# Patient Record
Sex: Male | Born: 1950 | Race: Black or African American | Hispanic: No | Marital: Married | Smoking: Former smoker
Health system: Southern US, Community
[De-identification: ages and names within clinical notes are randomized; demographics above are authoritative.]

## PROBLEM LIST (undated history)

## (undated) DIAGNOSIS — J439 Emphysema, unspecified: Secondary | ICD-10-CM

## (undated) DIAGNOSIS — I252 Old myocardial infarction: Secondary | ICD-10-CM

## (undated) DIAGNOSIS — K08109 Complete loss of teeth, unspecified cause, unspecified class: Secondary | ICD-10-CM

## (undated) DIAGNOSIS — D649 Anemia, unspecified: Secondary | ICD-10-CM

## (undated) DIAGNOSIS — E7800 Pure hypercholesterolemia, unspecified: Secondary | ICD-10-CM

## (undated) DIAGNOSIS — K219 Gastro-esophageal reflux disease without esophagitis: Secondary | ICD-10-CM

## (undated) DIAGNOSIS — I428 Other cardiomyopathies: Secondary | ICD-10-CM

## (undated) DIAGNOSIS — Z7901 Long term (current) use of anticoagulants: Secondary | ICD-10-CM

## (undated) DIAGNOSIS — F329 Major depressive disorder, single episode, unspecified: Secondary | ICD-10-CM

## (undated) DIAGNOSIS — F32A Depression, unspecified: Secondary | ICD-10-CM

## (undated) DIAGNOSIS — I10 Essential (primary) hypertension: Secondary | ICD-10-CM

## (undated) DIAGNOSIS — I2510 Atherosclerotic heart disease of native coronary artery without angina pectoris: Secondary | ICD-10-CM

## (undated) DIAGNOSIS — R600 Localized edema: Secondary | ICD-10-CM

## (undated) DIAGNOSIS — G8929 Other chronic pain: Secondary | ICD-10-CM

## (undated) DIAGNOSIS — F419 Anxiety disorder, unspecified: Secondary | ICD-10-CM

## (undated) DIAGNOSIS — I480 Paroxysmal atrial fibrillation: Secondary | ICD-10-CM

## (undated) DIAGNOSIS — N210 Calculus in bladder: Secondary | ICD-10-CM

## (undated) DIAGNOSIS — K5909 Other constipation: Secondary | ICD-10-CM

## (undated) DIAGNOSIS — Q8501 Neurofibromatosis, type 1: Secondary | ICD-10-CM

## (undated) DIAGNOSIS — H409 Unspecified glaucoma: Secondary | ICD-10-CM

## (undated) DIAGNOSIS — I5022 Chronic systolic (congestive) heart failure: Secondary | ICD-10-CM

## (undated) DIAGNOSIS — Z972 Presence of dental prosthetic device (complete) (partial): Secondary | ICD-10-CM

## (undated) DIAGNOSIS — Z8679 Personal history of other diseases of the circulatory system: Secondary | ICD-10-CM

## (undated) DIAGNOSIS — M549 Dorsalgia, unspecified: Secondary | ICD-10-CM

## (undated) DIAGNOSIS — N401 Enlarged prostate with lower urinary tract symptoms: Secondary | ICD-10-CM

## (undated) HISTORY — PX: CARDIAC CATHETERIZATION: SHX172

## (undated) HISTORY — PX: TRANSTHORACIC ECHOCARDIOGRAM: SHX275

## (undated) HISTORY — PX: EXCISIONAL HEMORRHOIDECTOMY: SHX1541

---

## 1999-05-08 ENCOUNTER — Encounter (INDEPENDENT_AMBULATORY_CARE_PROVIDER_SITE_OTHER): Payer: Self-pay | Admitting: *Deleted

## 1999-05-08 ENCOUNTER — Ambulatory Visit (HOSPITAL_COMMUNITY): Admission: RE | Admit: 1999-05-08 | Discharge: 1999-05-08 | Payer: Self-pay | Admitting: Gastroenterology

## 1999-05-21 ENCOUNTER — Encounter: Payer: Self-pay | Admitting: Gastroenterology

## 1999-05-21 ENCOUNTER — Encounter: Admission: RE | Admit: 1999-05-21 | Discharge: 1999-05-21 | Payer: Self-pay | Admitting: Gastroenterology

## 2000-09-22 ENCOUNTER — Encounter (HOSPITAL_COMMUNITY): Admission: RE | Admit: 2000-09-22 | Discharge: 2000-09-25 | Payer: Self-pay

## 2000-09-22 ENCOUNTER — Ambulatory Visit (HOSPITAL_COMMUNITY): Admission: RE | Admit: 2000-09-22 | Discharge: 2000-09-22 | Payer: Self-pay

## 2000-10-06 ENCOUNTER — Ambulatory Visit (HOSPITAL_COMMUNITY): Admission: RE | Admit: 2000-10-06 | Discharge: 2000-10-06 | Payer: Self-pay

## 2000-10-06 ENCOUNTER — Encounter (INDEPENDENT_AMBULATORY_CARE_PROVIDER_SITE_OTHER): Payer: Self-pay

## 2002-12-23 ENCOUNTER — Ambulatory Visit (HOSPITAL_COMMUNITY): Admission: RE | Admit: 2002-12-23 | Discharge: 2002-12-23 | Payer: Self-pay | Admitting: *Deleted

## 2003-06-15 ENCOUNTER — Emergency Department (HOSPITAL_COMMUNITY): Admission: EM | Admit: 2003-06-15 | Discharge: 2003-06-15 | Payer: Self-pay | Admitting: Emergency Medicine

## 2006-01-05 ENCOUNTER — Inpatient Hospital Stay (HOSPITAL_COMMUNITY): Admission: EM | Admit: 2006-01-05 | Discharge: 2006-01-07 | Payer: Self-pay | Admitting: Emergency Medicine

## 2006-01-05 ENCOUNTER — Ambulatory Visit: Payer: Self-pay | Admitting: Cardiology

## 2006-01-23 ENCOUNTER — Ambulatory Visit: Payer: Self-pay | Admitting: Cardiology

## 2008-05-25 ENCOUNTER — Emergency Department (HOSPITAL_COMMUNITY): Admission: EM | Admit: 2008-05-25 | Discharge: 2008-05-25 | Payer: Self-pay | Admitting: Emergency Medicine

## 2008-10-31 ENCOUNTER — Encounter: Payer: Self-pay | Admitting: Cardiology

## 2008-11-08 ENCOUNTER — Emergency Department (HOSPITAL_COMMUNITY): Admission: EM | Admit: 2008-11-08 | Discharge: 2008-11-08 | Payer: Self-pay | Admitting: Family Medicine

## 2008-11-11 DIAGNOSIS — E785 Hyperlipidemia, unspecified: Secondary | ICD-10-CM

## 2008-11-11 DIAGNOSIS — R718 Other abnormality of red blood cells: Secondary | ICD-10-CM

## 2008-11-11 DIAGNOSIS — I10 Essential (primary) hypertension: Secondary | ICD-10-CM

## 2008-11-11 DIAGNOSIS — D509 Iron deficiency anemia, unspecified: Secondary | ICD-10-CM

## 2008-11-11 DIAGNOSIS — R079 Chest pain, unspecified: Secondary | ICD-10-CM | POA: Insufficient documentation

## 2008-11-11 DIAGNOSIS — K219 Gastro-esophageal reflux disease without esophagitis: Secondary | ICD-10-CM | POA: Insufficient documentation

## 2008-11-16 ENCOUNTER — Encounter (INDEPENDENT_AMBULATORY_CARE_PROVIDER_SITE_OTHER): Payer: Self-pay | Admitting: *Deleted

## 2008-12-23 ENCOUNTER — Ambulatory Visit: Payer: Self-pay | Admitting: Internal Medicine

## 2008-12-23 ENCOUNTER — Observation Stay (HOSPITAL_COMMUNITY): Admission: EM | Admit: 2008-12-23 | Discharge: 2008-12-24 | Payer: Self-pay | Admitting: Emergency Medicine

## 2008-12-25 ENCOUNTER — Telehealth: Payer: Self-pay | Admitting: Cardiology

## 2009-01-01 ENCOUNTER — Telehealth (INDEPENDENT_AMBULATORY_CARE_PROVIDER_SITE_OTHER): Payer: Self-pay | Admitting: *Deleted

## 2009-01-02 ENCOUNTER — Ambulatory Visit: Payer: Self-pay | Admitting: Internal Medicine

## 2009-01-02 ENCOUNTER — Ambulatory Visit: Payer: Self-pay

## 2009-01-02 ENCOUNTER — Encounter: Payer: Self-pay | Admitting: Cardiology

## 2009-01-02 ENCOUNTER — Encounter: Payer: Self-pay | Admitting: Cardiovascular Disease

## 2009-01-02 ENCOUNTER — Observation Stay (HOSPITAL_COMMUNITY): Admission: EM | Admit: 2009-01-02 | Discharge: 2009-01-04 | Payer: Self-pay | Admitting: Emergency Medicine

## 2009-02-12 ENCOUNTER — Ambulatory Visit: Payer: Self-pay | Admitting: Cardiology

## 2009-02-12 DIAGNOSIS — I2510 Atherosclerotic heart disease of native coronary artery without angina pectoris: Secondary | ICD-10-CM | POA: Insufficient documentation

## 2009-02-13 LAB — CONVERTED CEMR LAB
BUN: 19 mg/dL (ref 6–23)
Chloride: 106 meq/L (ref 96–112)
Glucose, Bld: 91 mg/dL (ref 70–99)
Potassium: 4 meq/L (ref 3.5–5.1)

## 2010-07-03 ENCOUNTER — Other Ambulatory Visit: Payer: Self-pay | Admitting: Family Medicine

## 2010-07-15 ENCOUNTER — Telehealth (INDEPENDENT_AMBULATORY_CARE_PROVIDER_SITE_OTHER): Payer: Self-pay | Admitting: *Deleted

## 2010-07-25 NOTE — Progress Notes (Signed)
  Phone Note Other Incoming   Request: Send information Summary of Call: Request for records received from [facility]. Request forwarded to [facility].     

## 2010-08-24 LAB — CBC
HCT: 39.5 % (ref 39.0–52.0)
HCT: 42.3 % (ref 39.0–52.0)
HCT: 44.6 % (ref 39.0–52.0)
Hemoglobin: 13.1 g/dL (ref 13.0–17.0)
MCHC: 32.7 g/dL (ref 30.0–36.0)
MCHC: 33.2 g/dL (ref 30.0–36.0)
MCV: 77.7 fL — ABNORMAL LOW (ref 78.0–100.0)
MCV: 77.7 fL — ABNORMAL LOW (ref 78.0–100.0)
MCV: 78.6 fL (ref 78.0–100.0)
Platelets: 274 10*3/uL (ref 150–400)
Platelets: 275 10*3/uL (ref 150–400)
RBC: 5.03 MIL/uL (ref 4.22–5.81)
RDW: 14.9 % (ref 11.5–15.5)
WBC: 7.9 10*3/uL (ref 4.0–10.5)

## 2010-08-24 LAB — BASIC METABOLIC PANEL
BUN: 22 mg/dL (ref 6–23)
CO2: 26 mEq/L (ref 19–32)
CO2: 27 mEq/L (ref 19–32)
Calcium: 8.9 mg/dL (ref 8.4–10.5)
Chloride: 104 mEq/L (ref 96–112)
Chloride: 106 mEq/L (ref 96–112)
Glucose, Bld: 97 mg/dL (ref 70–99)
Glucose, Bld: 98 mg/dL (ref 70–99)
Potassium: 3.6 mEq/L (ref 3.5–5.1)
Sodium: 138 mEq/L (ref 135–145)

## 2010-08-24 LAB — COMPREHENSIVE METABOLIC PANEL
ALT: 11 U/L (ref 0–53)
AST: 17 U/L (ref 0–37)
Alkaline Phosphatase: 61 U/L (ref 39–117)
CO2: 28 mEq/L (ref 19–32)
Chloride: 103 mEq/L (ref 96–112)
Creatinine, Ser: 1.05 mg/dL (ref 0.4–1.5)
GFR calc Af Amer: >60 mL/min (ref >60)
GFR calc non Af Amer: >60 mL/min (ref >60)
Total Bilirubin: 1.2 mg/dL (ref 0.3–1.2)

## 2010-08-24 LAB — DIFFERENTIAL
Basophils Absolute: 0 10*3/uL (ref 0.0–0.1)
Basophils Relative: 0 % (ref 0–1)
Eosinophils Absolute: 0.1 10*3/uL (ref 0.0–0.7)
Eosinophils Relative: 1 % (ref 0–5)
Lymphocytes Relative: 23 % (ref 12–46)

## 2010-08-24 LAB — CARDIAC PANEL(CRET KIN+CKTOT+MB+TROPI)
CK, MB: 1.4 ng/mL (ref 0.3–4.0)
Relative Index: 1 (ref 0.0–2.5)
Relative Index: 1.2 (ref 0.0–2.5)
Total CK: 105 U/L (ref 7–232)

## 2010-08-24 LAB — LIPID PANEL
Cholesterol: 187 mg/dL (ref 0–200)
HDL: 69 mg/dL (ref >39)
Triglycerides: 46 mg/dL (ref <150)

## 2010-08-24 LAB — HEPARIN LEVEL (UNFRACTIONATED): Heparin Unfractionated: 0.45 IU/mL (ref 0.30–0.70)

## 2010-08-24 LAB — PROTIME-INR: Prothrombin Time: 13.4 seconds (ref 11.6–15.2)

## 2010-08-25 LAB — RAPID URINE DRUG SCREEN, HOSP PERFORMED
Barbiturates: NOT DETECTED
Cocaine: NOT DETECTED
Opiates: NOT DETECTED

## 2010-08-25 LAB — DIFFERENTIAL
Basophils Absolute: 0 10*3/uL (ref 0.0–0.1)
Basophils Relative: 0 % (ref 0–1)
Eosinophils Absolute: 0.1 10*3/uL (ref 0.0–0.7)
Neutro Abs: 5.4 10*3/uL (ref 1.7–7.7)
Neutrophils Relative %: 77 % (ref 43–77)

## 2010-08-25 LAB — CBC
HCT: 40.4 % (ref 39.0–52.0)
Hemoglobin: 13.2 g/dL (ref 13.0–17.0)
MCHC: 32.7 g/dL (ref 30.0–36.0)
MCV: 77.6 fL — ABNORMAL LOW (ref 78.0–100.0)
MCV: 78.3 fL (ref 78.0–100.0)
Platelets: 272 10*3/uL (ref 150–400)
RBC: 5.16 MIL/uL (ref 4.22–5.81)
RBC: 5.66 MIL/uL (ref 4.22–5.81)
RDW: 15.2 % (ref 11.5–15.5)
WBC: 7 10*3/uL (ref 4.0–10.5)

## 2010-08-25 LAB — POCT CARDIAC MARKERS
CKMB, poc: 1.1 ng/mL (ref 1.0–8.0)
CKMB, poc: <1 ng/mL — ABNORMAL LOW (ref 1.0–8.0)
Troponin i, poc: <0.05 ng/mL (ref 0.00–0.09)
Troponin i, poc: <0.05 ng/mL (ref 0.00–0.09)

## 2010-08-25 LAB — POCT I-STAT, CHEM 8
Calcium, Ion: 1.24 mmol/L (ref 1.12–1.32)
Creatinine, Ser: 1 mg/dL (ref 0.4–1.5)
Glucose, Bld: 99 mg/dL (ref 70–99)
HCT: 47 % (ref 39.0–52.0)
Hemoglobin: 16 g/dL (ref 13.0–17.0)
Potassium: 4.9 mEq/L (ref 3.5–5.1)

## 2010-08-25 LAB — PROTIME-INR
INR: 1 (ref 0.00–1.49)
Prothrombin Time: 13.1 seconds (ref 11.6–15.2)

## 2010-08-25 LAB — CARDIAC PANEL(CRET KIN+CKTOT+MB+TROPI)
CK, MB: 1.3 ng/mL (ref 0.3–4.0)
Relative Index: 1.2 (ref 0.0–2.5)
Relative Index: 1.5 (ref 0.0–2.5)
Total CK: 109 U/L (ref 7–232)

## 2010-08-25 LAB — URINALYSIS, ROUTINE W REFLEX MICROSCOPIC
Bilirubin Urine: NEGATIVE
Hgb urine dipstick: NEGATIVE
Nitrite: NEGATIVE
Specific Gravity, Urine: 1.023 (ref 1.005–1.030)
Urobilinogen, UA: 1 mg/dL (ref 0.0–1.0)
pH: 5.5 (ref 5.0–8.0)

## 2010-08-25 LAB — CK TOTAL AND CKMB (NOT AT ARMC)
CK, MB: 2.3 ng/mL (ref 0.3–4.0)
Relative Index: 1.3 (ref 0.0–2.5)
Total CK: 176 U/L (ref 7–232)

## 2010-09-02 LAB — POCT I-STAT, CHEM 8
Chloride: 104 mEq/L (ref 96–112)
Glucose, Bld: 99 mg/dL (ref 70–99)
HCT: 50 % (ref 39.0–52.0)
Hemoglobin: 17 g/dL (ref 13.0–17.0)
Potassium: 4.7 mEq/L (ref 3.5–5.1)
Sodium: 141 mEq/L (ref 135–145)

## 2010-09-07 IMAGING — CR DG CHEST 2V
2 series · 2 of 2 positions shown · non-contrast
Comparison: CT scan from 01/07/2006.  Chest x-ray from 01/06/2006.

CLINICAL DATA: Chest pain.  Weakness.  Shortness of breath.

CHEST - 2 VIEW

[w chest pa]
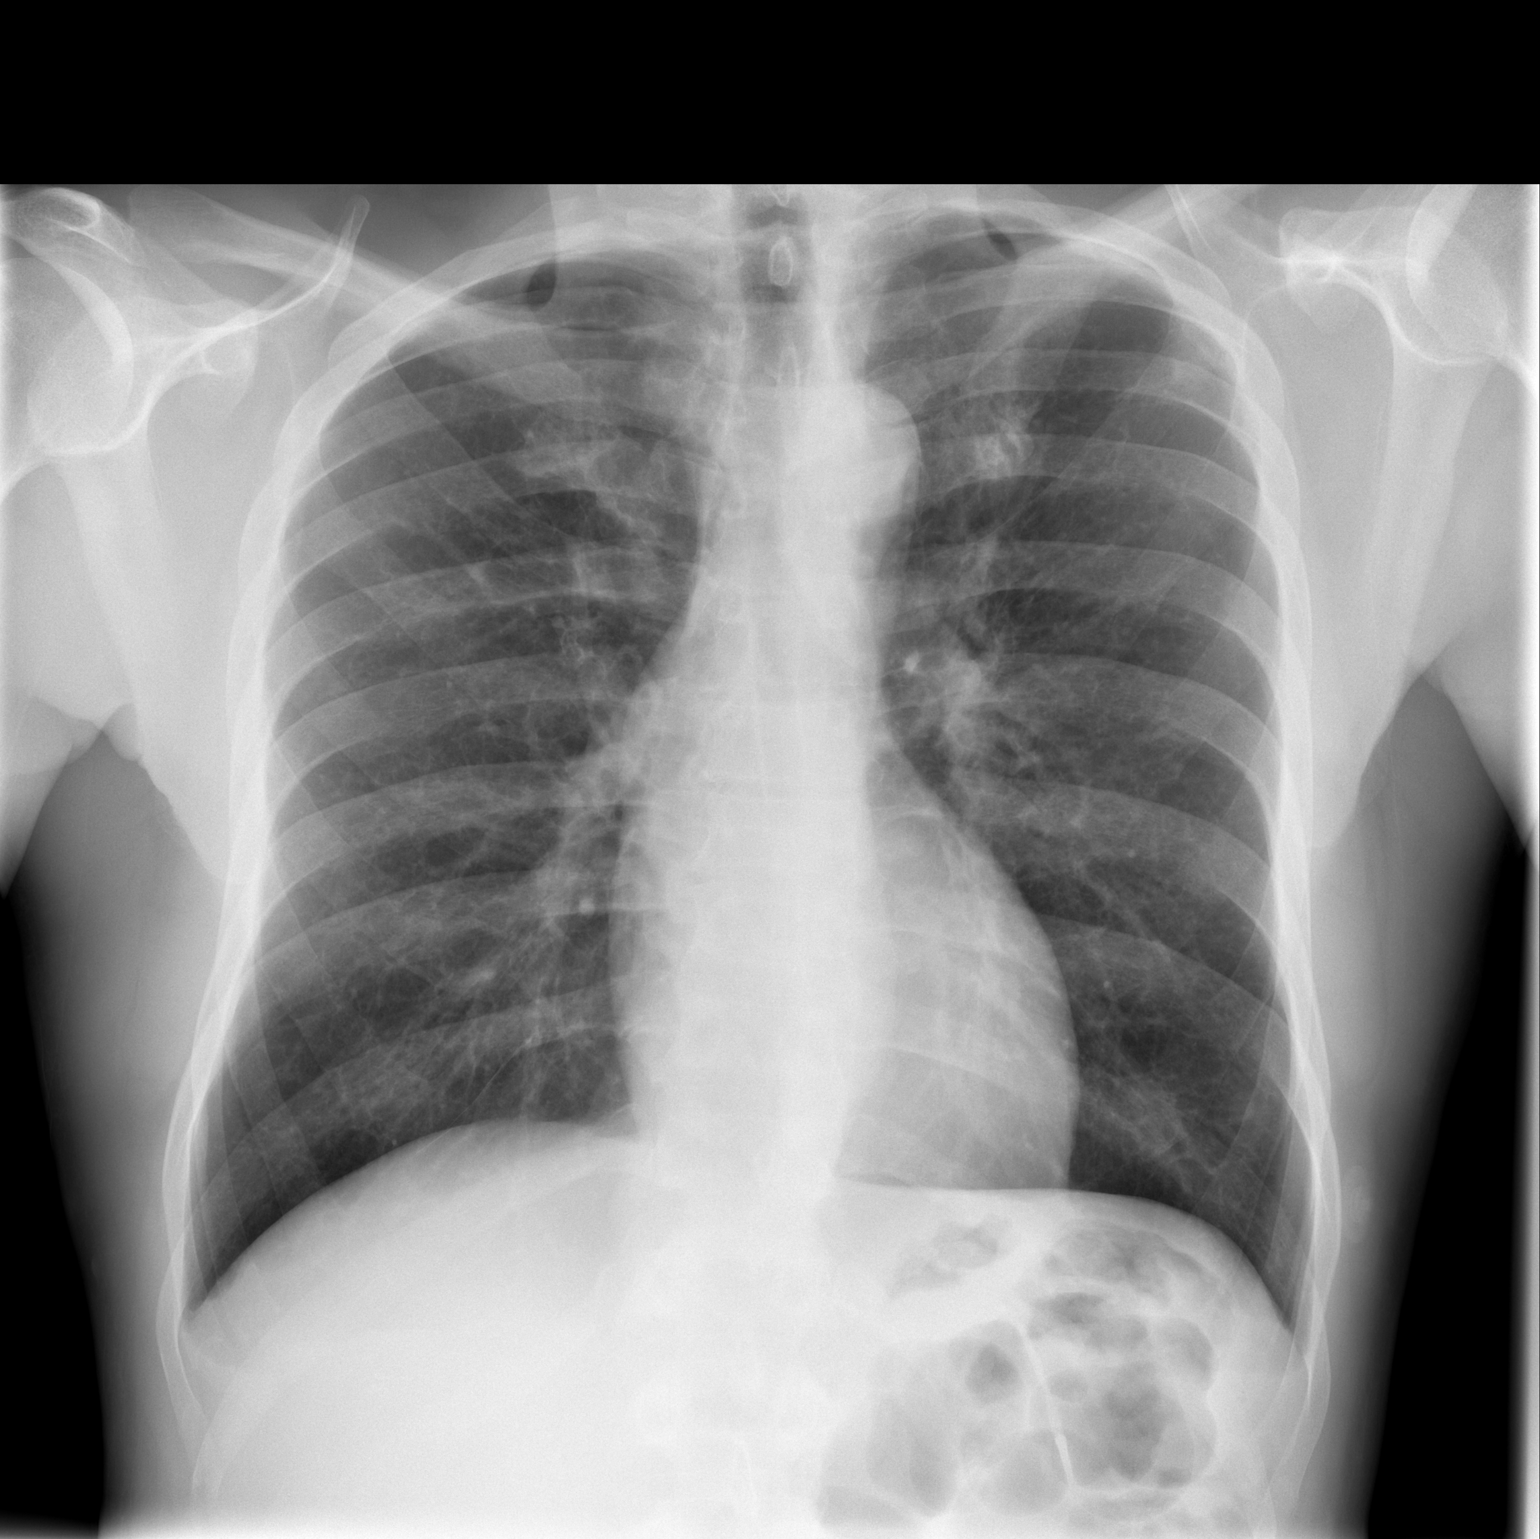

[w chest lat *]
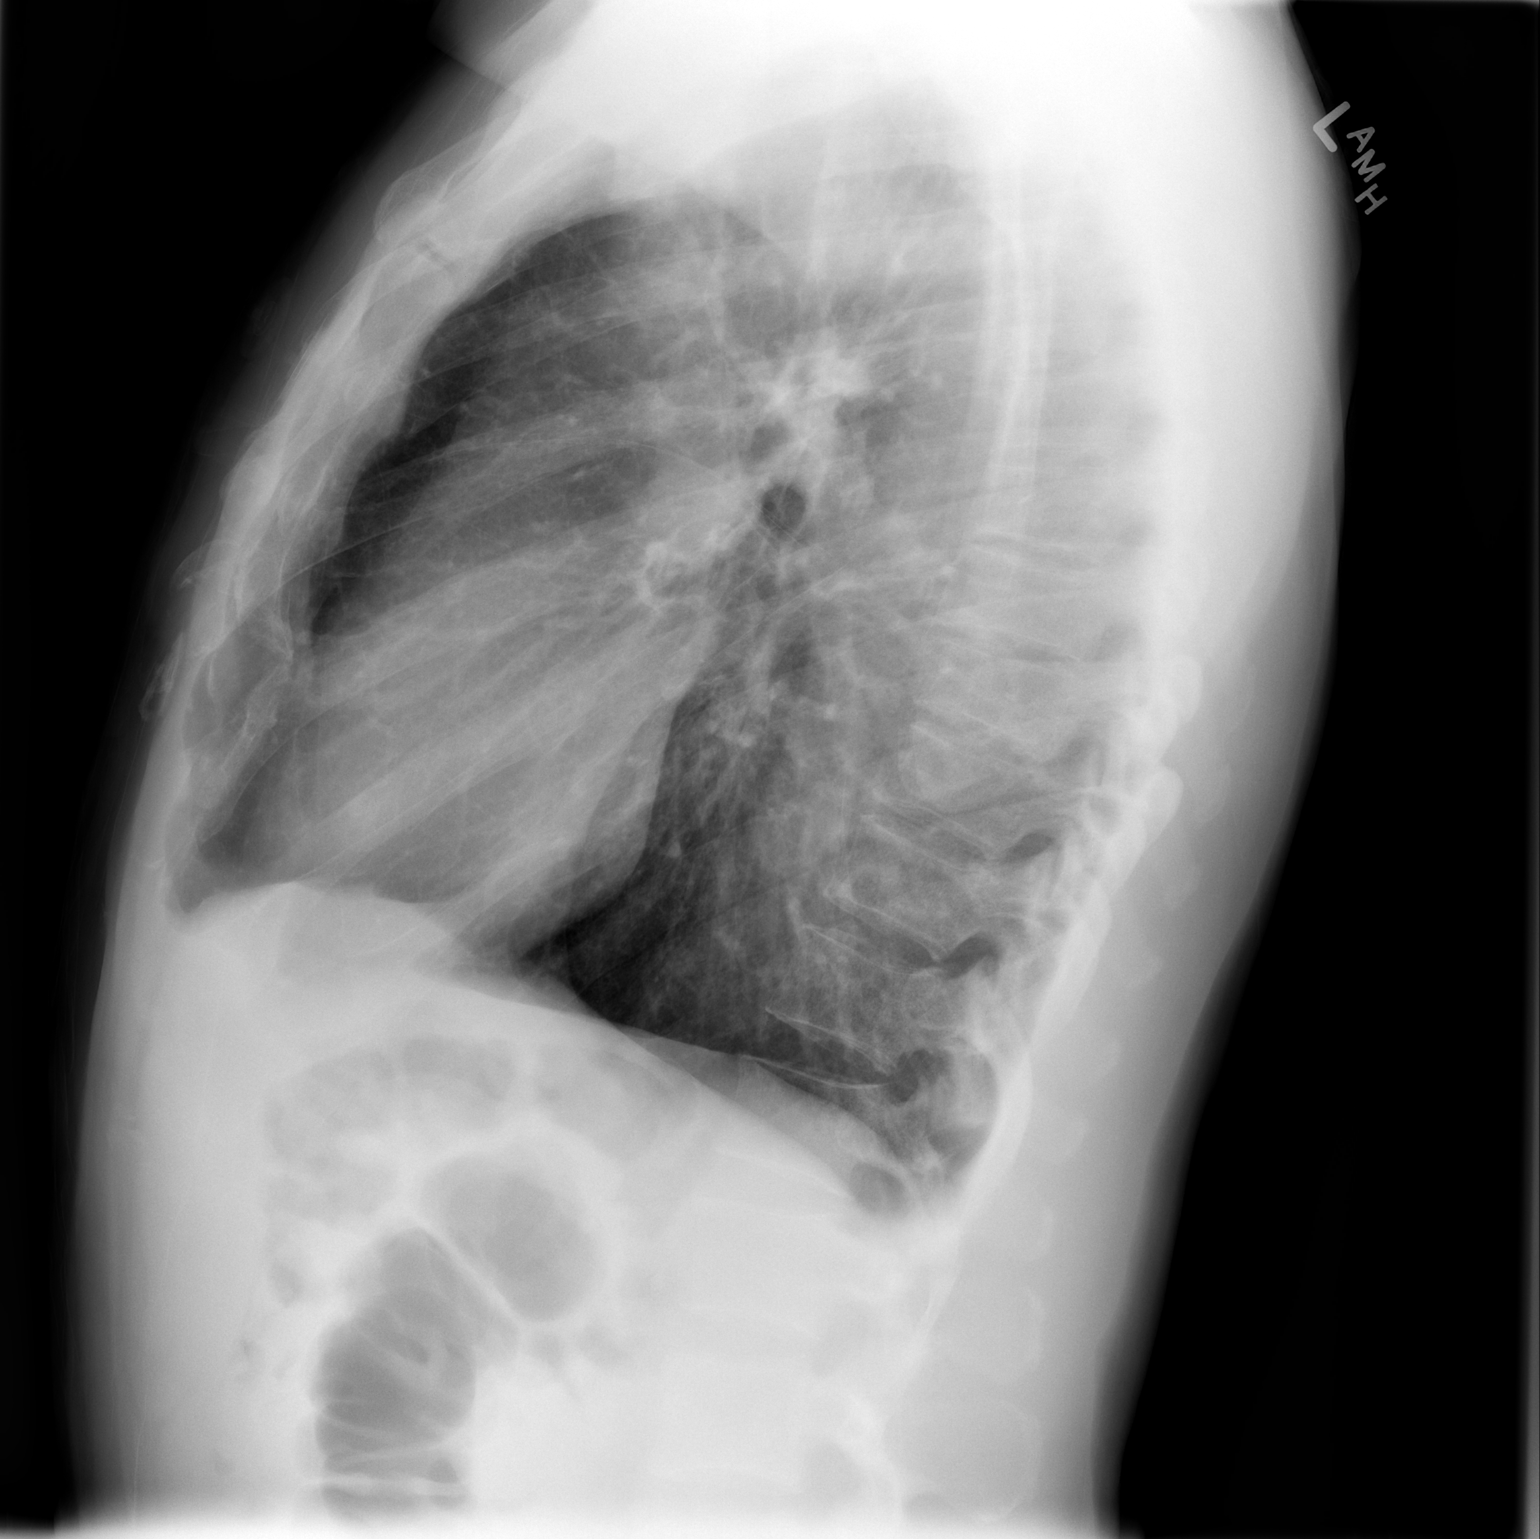

[2 of 2 positions shown; findings below may reference images not displayed]

FINDINGS: Lungs are hyperexpanded. Interstitial markings are
diffusely coarsened with chronic features. The cardiopericardial
silhouette is within normal limits for size. Scarring in the
peripheral left upper lobe is stable. Imaged bony structures of the
thorax are intact.
IMPRESSION: Emphysema.  No acute cardiopulmonary process.

## 2010-09-17 IMAGING — CR DG CHEST 1V PORT
1 series · 1 of 1 positions shown · non-contrast
Comparison: 12/23/2008

CLINICAL DATA: Chest pain.  Shortness of breath.

PORTABLE CHEST - 1 VIEW

[view not recorded]
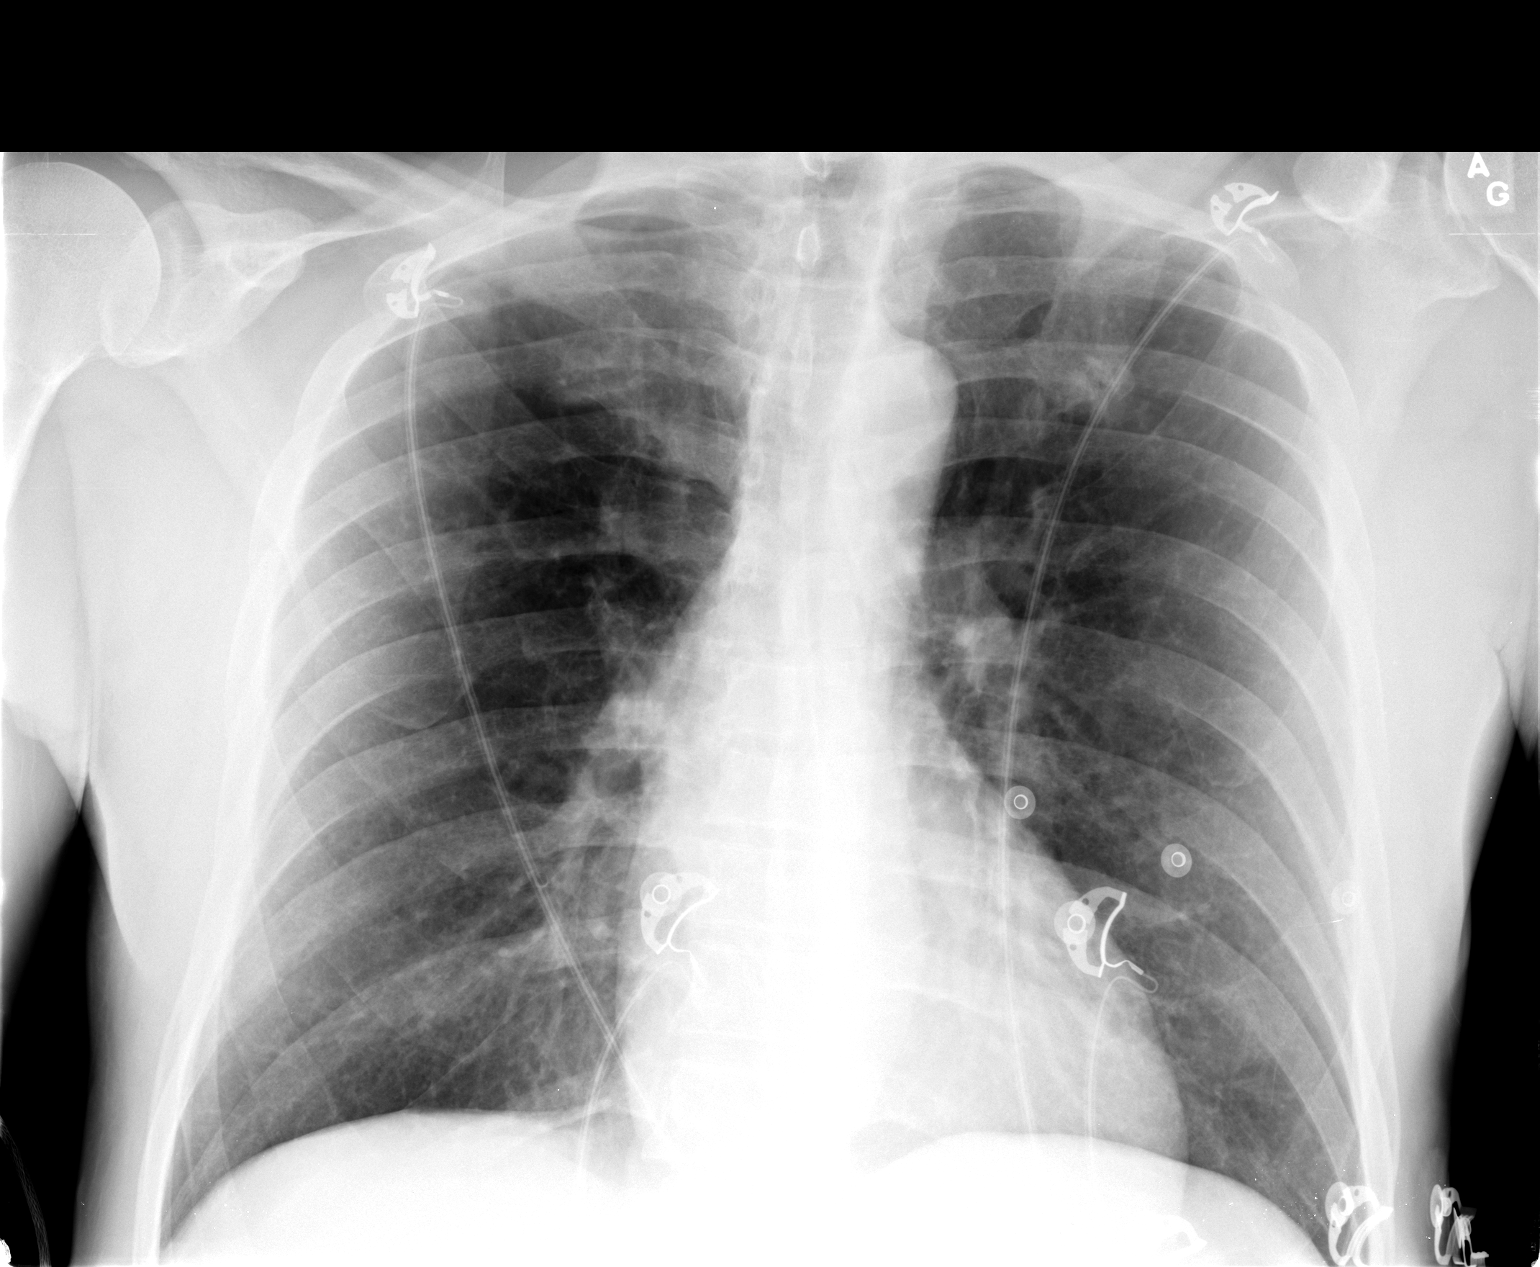

[1 of 1 positions shown; findings below may reference images not displayed]

FINDINGS: Heart size is normal.  Both lungs are clear.  There is no
evidence of pleural effusion.  No mass or adenopathy identified.
Pulmonary hyperinflation again seen, consistent with COPD.
IMPRESSION: COPD.  No acute findings.

## 2010-10-01 NOTE — Discharge Summary (Signed)
NAME:  Cody Brewer, Cody Brewer NO.:  [id number]   MEDICAL RECORD NO.:  [id number]          PATIENT TYPE:  INP   LOCATION:  2014                         FACILITY:  [facility]   PHYSICIAN:  Madolyn Frieze. Jens Som, MD, FACCDATE OF BIRTH:  [birth year]   DATE OF ADMISSION:  01/02/2009  DATE OF DISCHARGE:  01/04/2009                               DISCHARGE SUMMARY   PRIMARY CARDIOLOGIST:  Madolyn Frieze. Jens Som, MD, [facility]   PRIMARY CARE Cayne Yom:  C. Duane Lope, MD   DISCHARGE DIAGNOSIS:  Chest pain with coronary vasospasm.   SECONDARY DIAGNOSES:  1. Hypertension.  2. Hyperlipidemia.  3. Nonobstructive coronary artery disease.  4. Remote tobacco abuse, quitting greater than 20 years ago.   ALLERGIES:  No known drug allergies.   PROCEDURE:  Left heart cardiac catheterization performed on January 03, 2009, revealing nonobstructive CAD with 70% stenosis in the mid-to-  distal LAD with some spasm and bridging.  This improved with  intracoronary nitroglycerin.  EF was 65%.   HISTORY OF PRESENT ILLNESS:  A 60 year old African American male who was  recently admitted to [facility] on August 7 with complaints of chest  discomfort.  He subsequently ruled out and was discharged home and  underwent outpatient Myoview in our office on August 17 revealing  anteroseptal perfusion defects as well as ECG changes and recurrent  chest pain.  The patient was admitted from the office.   HOSPITAL COURSE:  The patient ruled out for MI.  He was scheduled for  left heart cardiac catheterization which took place on August 18  revealing nonobstructive CAD as outlined above.  He was noted to have  coronary vasospasm as well as bridging.  Intracoronary nitroglycerin was  infused down the LAD with improvement in the LAD stenosis.  EF was  normal.  He has been placed on Imdur therapy and also maintained on  calcium channel blocker which he was previously treated with.  He had no  recurrent symptoms and will be  discharged home today in good condition.   DISCHARGE LABORATORY DATA:  Hemoglobin 13.1, hematocrit 39.5, WBC 8.1,  and platelets 246.  Sodium 138, potassium 4.0, chloride 106, CO2 of 26,  BUN 15, creatinine 1.01, and glucose 98.  Total bilirubin 1.2, alkaline  phosphatase 61, AST 17, ALT 11, total protein 7.8, albumin 3.9, and  calcium 8.9.  CK 105, MB 1.3, and troponin I 0.02.  Total cholesterol  187, triglycerides 46, HDL 69, and LDL 109.  TSH 0.839.   DISPOSITION:  The patient will be discharged home today in good  condition.   FOLLOWUP PLANS AND APPOINTMENTS:  We have arranged for followup with Dr.  Olga Millers in our [place] office on September 27th at 12:00 p.m.  The patient will have lipids and LFTs checked that day also.  He is  asked to follow with Dr. Vianne Bulls as previously scheduled.   DISCHARGE MEDICATIONS:  1. Imdur 30 mg daily.  2. Aspirin 81 mg daily.  3. Diltiazem CD 360 mg daily.  4. Garlic extract 4401 mg 2 tablets daily.  5. HCTZ 25 mg daily.  6. Labetalol 200 mg t.i.d.  7. Lisinopril 10 mg daily.  8. Nitroglycerin 0.4 mg sublingual p.r.n. chest pain.  9. Omega-3 fatty acid 1 g 2 capsules daily.  10.Simvastatin 40 mg at bedtime.   OUTSTANDING LABORATORY STUDIES:  None.   DURATION OF DISCHARGE ENCOUNTER:  40 minutes including physician time.      Nicolasa Ducking, ANP      Madolyn Frieze. Jens Som, MD, [facility]  Electronically Signed    CB/MEDQ  D:  01/04/2009  T:  01/04/2009  Job:  [id number]   cc:   C. Duane Lope, M.D.

## 2010-10-01 NOTE — Discharge Summary (Signed)
NAME:  Cody Brewer, Cody Brewer NO.:  [id number]   MEDICAL RECORD NO.:  [id number]          PATIENT TYPE:  INP   LOCATION:  3733                         FACILITY:  [facility]   PHYSICIAN:  Madolyn Frieze. Jens Som, MD, FACCDATE OF BIRTH:  09-15-1950   DATE OF ADMISSION:  12/23/2008  DATE OF DISCHARGE:  12/24/2008                               DISCHARGE SUMMARY   CARDIOLOGIST:  The patient does not have an established cardiologist,  will be followed by [name].  The patient was seen in the emergency  room by Jim Desanctis, cardiology fellow.   DISCHARGE DIAGNOSES:  Chest pain, felt to be atypical.  The patient  ruled out for myocardial infarction by serial cardiac markers, is  pending outpatient stress Myoview at the time of discharge.   PAST MEDICAL HISTORY:  1. Hypertension.  2. Mild nonobstructive CAD with a catheterization in 2007, showing an      EF of 65% and a 40% mid LAD lesion after the first diagonal.  3. History of non-ST elevated MI with suspicion for microvascular      disease or coronary vasospasm.  4. Hyperlipidemia.   [name] is a 60 year old African American gentleman with past  medical history as stated above who presented on the evening of December 23, 2008, complaining of chest discomfort.  He states it began before he  went to bed last night.  It occurred in the right parasternal to the  right upper quadrant abdominal area.  Apparently, he has been having  this occasionally for the last 2 weeks, besides his pain was more severe  on the evening of admission, but lasted for several hours.  He finally  decided to come and get evaluated.  He does complain of some  constipation and diarrhea symptoms also.  Dr. Jim Desanctis, saw the  patient in consultation.  Urine drug screen was negative.  Troponin was  negative.  CK-MB less than 1.  EKG showed sinus rhythm without acute ST  or T-wave changes.  Chest x-ray showed no acute findings.  The patient  was admitted for 23-hour observation.  He ruled out for myocardial  infarction as stated above.  He was found to be hypertensive and his  labetalol was increased to 200 mg t.i.d.  He is also started on  lisinopril 10 mg daily.  We continued his hydrochlorothiazide 25 mg  daily, aspirin 325 mg daily, nitroglycerin p.r.n., simvastatin 40 mg  daily, diltiazem 360 mg daily, fish oil 1000 mg 2 capsules daily.  He  may continue his garlic as previously taken.  I will have the office  call him on Monday to schedule an outpatient workup including  echocardiogram and a stress Myoview.  Duration of discharge encounter  greater than 30 minutes.      Dorian Pod, ACNP      Madolyn Frieze. Jens Som, MD, [facility]  Electronically Signed    MB/MEDQ  D:  12/25/2008  T:  12/26/2008  Job:  [id number]

## 2010-10-01 NOTE — H&P (Signed)
NAME:  Cody Brewer, Cody Brewer NO.:  [id number]   MEDICAL RECORD NO.:  [id number]          PATIENT TYPE:  INP   LOCATION:  2014                         FACILITY:  [facility]   PHYSICIAN:  Duke Salvia, MD, FACCDATE OF BIRTH:  1951-03-11   DATE OF ADMISSION:  01/02/2009  DATE OF DISCHARGE:                              HISTORY & PHYSICAL   PRIMARY CARE Xara Paulding:  C. Duane Lope, MD   PROFILE:  A 60 year old Caucasian male with prior history of  nonobstructive CAD and recent admission for chest pain.  He is being  readmitted for catheterization secondary to abnormal Myoview.   PROBLEM LIST:  1. History of nonobstructive coronary artery disease.      a.     Status post catheterization in 2007 showing normal left       ventricular function with a 40% stenosis in the mid left anterior       descending and otherwise normal coronary arteries.      b.     History of non-ST-elevation myocardial infarction with       suspicion for microvascular disease or coronary vasospasm.  2. Hyperlipidemia.  3. Hypertension.  4. Remote tobacco abuse, quitting greater than 20 years ago.   HISTORY OF PRESENT ILLNESS:  A 60 year old African American male with  the above problem list.  He was admitted to the [facility] on December 23, 2008 with complaints of chest discomfort.  He subsequently ruled out and  was discharged home and setup for an outpatient Myoview in our office.  He underwent Myoview on January 02, 2009, revealing anteroseptal  perfusion defects as well as ECG changes and recurrent chest pain.  He  subsequently admitted from the office for catheterization.  In the ER,  he is pain free.   ALLERGIES:  No known drug allergies.   HOME MEDICATIONS:  1. Cartia XT 360 mg daily.  2. Aspirin 81 mg daily.  3. Hydrochlorothiazide 12.5 mg daily.  4. Labetalol 200 mg t.i.d.  5. Lisinopril 10 mg daily.  6. Fish oil daily.  7. Garlic daily.   FAMILY HISTORY:  Mother died in her 60s with  diabetes.  Father died in  his late 80s possibly from CVA.   SOCIAL HISTORY:  He lives in [place] with his wife.  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  He previously smoked, but quit more than  20 years ago.  He says maybe he smoked for 4 years.  Denies alcohol or  drugs.  He is not routinely exercising.   REVIEW OF SYSTEMS:  Positive for chest pain.  He also has a history of  chest pain, relieved with PPIs and H2 blockers.  Otherwise, all systems  reviewed and negative.   PHYSICAL EXAMINATION:  VITAL SIGNS:  He is afebrile, heart rate 72,  respirations 16, blood pressure 138/90.  GENERAL:  A pleasant African American male in no acute distress.  Awake,  alert and oriented x3.  HEENT:  Normal.  NEUROLOGIC:  Grossly intact.  Nonfocal.  SKIN:  Warm and dry without lesions or masses.  NECK:  Supple without  bruits or JVD.  LUNGS:  Respirations regular and unlabored.  Clear to auscultation.  CARDIAC:  Regular S1, S2.  No S3, S4, or murmurs.  ABDOMEN:  Round, soft, nontender, nondistended.  Bowel sounds present  x4.  EXTREMITIES:  Warm and dry.  No clubbing, cyanosis, or edema.  Dorsalis  pedis and posterior tibial pulses are 2+ and equal bilaterally.   ACCESSORY CLINICAL FINDINGS:  All lab work Chest x-ray are pending.   ASSESSMENT AND PLAN:  1. Unstable angina.  The patient has a history of nonobstructive      coronary artery disease and underwent stress testing in the office      on January 02, 2009, revealing anteroseptal perfusion defect.  He      will be admitted for cardiac catheterization on January 03, 2009.      We will continue his home medications and add heparin.  Plan of      cycle cardiac markers.  2. Hypertension.  Continue home meds and adjust as necessary.  3. Hyperlipidemia.  Continue statin therapy.      Nicolasa Ducking, ANP      Duke Salvia, MD, [facility]  Electronically Signed    CB/MEDQ  D:  01/03/2009  T:  01/04/2009  Job:  [id number]

## 2010-10-01 NOTE — H&P (Signed)
NAME:  Cody Brewer, POLLAN NO.:  [id number]   MEDICAL RECORD NO.:  [id number]          PATIENT TYPE:  EMS   LOCATION:  MAJO                         FACILITY:  [facility]   PHYSICIAN:  Wendi Snipes, MD DATE OF BIRTH:  09-28-1950   DATE OF ADMISSION:  12/23/2008  DATE OF DISCHARGE:                              HISTORY & PHYSICAL   This is an admission to Dr. Ludwig Clarks cardiology service.   PRIMARY DOCTORS:  Dr. Tenny Craw and Dr. Clarene Duke with [facility]   CHIEF COMPLAINT:  Chest pain.   HISTORY OF PRESENT ILLNESS:  This is a 60 year old African American male  with mild nonobstructive coronary disease and history of non-STEMI here  with recurrent chest pain.  He states that the pain began last night and  is located in his right parasternal to right upper quadrant abdominal  area that occurred last night.  He states the pain is very similar to  the symptoms he experienced when he was hospitalized and diagnosed with  a non-ST elevation MI with a troponin of approximately 1.  His symptoms  have also occurred occasionally for the past 2 weeks, though has only  lasted moments at a time.  More recently, his pain began last night and  was quite severe and lasted until today.  He states that his blood  pressure has recently been elevated, but he also states that it is  always up.  Otherwise, he has been in his usual state of health, though  reports occasional altering constipation and diarrhea symptoms.  He  denies exertional angina, shortness of breath, paroxysmal nocturnal  dyspnea, orthopnea or increased lower extremity edema and states that  the pain generally occurs when he wakes up.   PAST MEDICAL HISTORY:  1. Hypertension.  2. Mild nonobstructive coronary disease with recent cardiac      catheterization in 2007, showing a normal EF and 40% mid LAD lesion      after the first diagonal.  3. History of non-ST elevation MI with a suspicion for microvascular  disease or coronary vasospasm.  4. Hyperlipidemia.   ALLERGIES:  NO KNOWN DRUG ALLERGIES.   MEDICATIONS:  He does not know his current medical regimen, though in  2007, he was taking;  1. Cartia XT 360 mg daily.  2. Zocor 40 mg daily.  3. Aspirin 81 mg daily.  4. Hydrochlorothiazide 12.5 mg daily.  5. Labetalol 4 mg twice daily.   SOCIAL HISTORY:  He lives in [place] with his wife.  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   FAMILY HISTORY:  His mother and father both had diabetes.   REVIEW OF SYSTEMS:  All 14 systems were reviewed and were negative  except as mentioned in detail in the HPI.   PHYSICAL EXAMINATION:  VITAL SIGNS:  His blood pressure is 165/115,  respiratory rate 16, pulse 72.  He is saturating 100% on 2 liters nasal  cannula.  GENERAL:  He is a 60 year old African American male appearing stated age  in no acute distress.  HEENT:  Moist mucous membranes.  Pupils equal,  round  and reactive to light and accommodation.  Anicteric sclerae.  NECK:  No jugulovenous distention.  No thyromegaly.  CARDIOVASCULAR:  Regular rate and rhythm.  No murmurs, rubs or gallops.  LUNGS:  Clear to auscultation bilaterally.  ABDOMEN:  Nontender, nondistended.  Positive bowel sounds.  No masses.  EXTREMITIES:  No clubbing, cyanosis, edema.  2+ pulses throughout.  NEUROLOGIC:  Alert and x3.  Cranial nerves II-XII grossly intact.  No  focal neurologic deficit.  SKIN:  Warm, dry and intact.  No rashes.  PSYCH:  Mood and affect are appropriate.   RADIOLOGY:  Chest x-ray showed no acute cardiopulmonary process with  mild evidence of emphysema.  EKG showed a normal sinus rhythm at a rate  of 77 beats per minute with no ST or T-wave abnormalities suggestive of  recent or ongoing ischemia.  Normal EKG.   LABORATORY REVIEW:  White blood cell count 7.1.  His hematocrit is 43.9,  his platelet count is 272.  His potassium is 4.9.  His BUN is 18.  His  creatinine is 1.0.   His blood sugar is 99.  His INR is 1.0.  Urine drug  screen is unremarkable.  Troponin-I is less than 0.05 and CK-MB is less  than 1.   ASSESSMENT/PLAN:  This is a 60 year old [place] American male here with  poorly controlled hypertension and mild coronary artery disease  complaining of similar symptoms surrounding a non-ST elevation  myocardial infarction.   1. Unstable angina.  We will empirically treat him with heparin and      aspirin for possible acute coronary syndrome, though very unlikely      as he has had approximately 12 hours of pain without any evidence      of ischemia to this point.  His EKG is currently unremarkable and      he is pain free at this time.  We will continue cardiac enzymes and      consider noninvasive risk ratification in the morning.  His pain      may very likely be due to poorly controlled blood pressure which      sounds to be the etiology of his elevated troponin in the past.  2. Hypertension.  We will increase his labetalol to 400 three times      daily, hydrochlorothiazide to 25 and continue ACE inhibitor.  We      will additionally continue his calcium channel blocker and watch      his blood pressure very carefully.  3. Gastroesophageal reflux disease.  We will put him on Nexium 40 mg      in the morning and consider further workup.  This appears to be an      abdominal source of discomfort.      Wendi Snipes, MD  Electronically Signed     BHH/MEDQ  D:  12/23/2008  T:  12/23/2008  Job:  [id number]

## 2010-10-04 NOTE — Cardiovascular Report (Signed)
NAME:  Cody Brewer, Cody Brewer                      ACCOUNT NO.:  [id number]   MEDICAL RECORD NO.:  [id number]                   PATIENT TYPE:  AMB   LOCATION:  ENDO                                 FACILITY:  12/23/2002   PHYSICIAN:  Meade Maw, M.D.                 DATE OF BIRTH:  09/24/1950   DATE OF PROCEDURE:  DATE OF DISCHARGE:                              CARDIAC CATHETERIZATION   INDICATION FOR PROCEDURE:  Ongoing chest pain. Stress Cardiolite revealing  decreased uptake in the anterior wall with evidence of reversal.  He was  noted to have an ejection fraction of 50% and diastolic volume of 141 ml.   PROCEDURE:  After obtaining written informed consent, the patient was  brought to the cardiac catheterization lab in a postabsorptive state.  Preop  sedation was achieved using Versed 3 mg IV.  The right groin was prepped and  draped in the usual sterile fashion.  Local anesthesia was achieved using 1%  Xylocaine.  A 6-French hemostasis sheath was placed into the right femoral  artery using the modified Seldinger technique.  Selective coronary  angiography was performed using JL-4, JR-4 Judkins catheter.  Multiple views  were obtained. All catheter exchanges were made over guide wire.  Single  plan ventriculogram was performed in the RAO position using 6-French pigtail  curved catheter.  The hemostasis sheath was flushed following each catheter  exchange.  Following review of the film, there was no identifiable coronary  artery disease.  The patient was transferred to the holding area.  The  hemostasis sheath was removed and hemostasis was achieved using the FemoStop  device.  There was no immediate complications.   FINDINGS:  The aortic pressure is 141/56.  The LV pressure was 140/6.  The  EDP was 14.   Single plane ventriculogram revealed normal wall motion.  Ejection fraction  is approximately 65%.  There was no mitral regurgitation noted.   CORONARY ANGIOGRAPHY:  The left  main coronary artery bifurcates into the  left anterior descending and circumflex vessel.  There is no disease noted  in the left main coronary artery, left anterior descending, left anterior  descending gives rise to a large trifurcating D#1 and large D#2, a moderate  D#3 and goes on in as an apical branch.  There is no disease noted in the  left anterior descending or its branches.  Circumflex vessel is a large  caliber vessel.  It gives rise to a trivial OM-1, large OM-2, large OM-3.  Goes on in as an AV groove vessel.  There is no disease noted in the  circumflex or its branches.  Right coronary artery is large dominant artery  giving rise to two RV marginals, large PDA, large PL branch.  There is no  disease in the right coronary artery.   FINAL IMPRESSION:  1. Normal coronary angiography.  2. Normal single plane ventriculogram.  3. Normal aortic pressure.  RECOMMENDATIONS:  Consider other etiologies for his chest pain.                                                Meade Maw, M.D.   HP/MEDQ  D:  12/23/2002  T:  12/23/2002  Job:  [id number]

## 2010-10-04 NOTE — Op Note (Signed)
[facility]  Patient:    Cody Brewer, Cody Brewer                   MRN: [id number] Proc. Date: 10/06/00 Adm. Date:  [id number] Attending:  Meredith Leeds                           Operative Report  PREOPERATIVE DIAGNOSES: 1. Anemia. 2. Hemorrhoids. 3. Anal fissure.  POSTOPERATIVE DIAGNOSES: 1. Hemorrhoids. 2. Anemia.  OPERATION: 1. Proctoscopy. 2. Hemorrhoidectomy, internal and external, complete.  SURGEON:  Zigmund Daniel, M.D.  DESCRIPTION OF PROCEDURE:  After the patient was adequately anesthetized and had routine prepping and draping of perineum, I did a proctoscopy to 15 cm and saw no mucosal abnormalities.  There were prolapsing hemorrhoids.  When I did a digital exam, I no longer felt a fissure either posteriorly or anteriorly as I had before.  The hemorrhoids were somewhat friable.  I exposed the largest one which was the left posterior hemorrhoid, placed a chromic stitch above it and then elliptically excised it, and closed it with running 2-0 chromic stitch sewn down to the internal sphincter and to the perianal skin.  This provided good hemostasis after I had cauterized a couple of vessels.  I similarly treated the right posterior hemorrhoid.  There was one remaining external tag between the two which I excised and closed.  I treated the anterior hemorrhoid by putting a Kelly clamp longitudinally on it and undersewing it with a basic 2-0 chromic stitch and then oversewing that suture line for good hemostasis.  I thoroughly anesthetized the anal area with 0.5% Marcaine with epinephrine.  Blood loss was less than 100 cc.  The patient was stable throughout the operation. DD:  10/06/00 TD:  10/06/00 Job: [id number]

## 2010-10-04 NOTE — Cardiovascular Report (Signed)
NAME:  Cody Brewer, Cody Brewer NO.:  [id number]   MEDICAL RECORD NO.:  [id number]          PATIENT TYPE:  INP   LOCATION:  2908                         FACILITY:  [facility]   PHYSICIAN:  Everardo Beals. Juanda Chance, MD,FACCDATE OF BIRTH:  1951/05/03   DATE OF PROCEDURE:  01/06/2006  DATE OF DISCHARGE:                              CARDIAC CATHETERIZATION   CLINICAL HISTORY:  [name] is 60 years old and was evaluated in 2004  for chest pain and had a catheterization by Dr. Meade Maw which showed  no significant obstructive coronary disease.  Over the past several months,  he has had chest pain, some of which has been exertional.  The morning of  admission, he developed more severe chest and back pain, with some radiation  to his throat, and diaphoresis.  His wife brought him into the emergency  department.  His troponins were positive, and he was seen by [name]  and Marvis Repress and started on Lovenox and Integrilin and is scheduled for  evaluation with angiography.   PROCEDURE:  The procedure was performed via the right femoral artery using  arterial sheath and 6-French preformed coronary catheters.  Frontal arterial  puncture was performed, and Omnipaque contrast was used.  The patient  tolerated the procedure well and left the lab in satisfactory condition.   RESULTS:  1. Left main coronary artery was free of significant disease.  2. The left anterior descending artery gave rise to three diagonal      branches and two septal perforators.  The artery was irregular in its      proximal and  mid portion, and there was 40% narrowing after the first      diagonal branch.  There was TIMI-2 flow in this vessel.  3. The circumflex artery gave rise to a marginal branch and a      posterolateral branch.  This vessel was free of significant disease,      but there was TIMI-2 flow.  4. The right coronary artery is a moderate-sized vessel that gave rise to      a conus branch, 2  right ventricular branches, a posterior descending      branch, and a posterolateral branch.  There are irregularities in the      proximal and mid vessel.  There was TIMI-2 flow.  5. The left ventriculogram performed in the RAO projection showed good      wall motion with no areas of hypokinesis.  The estimated fraction was      70%.   HEMODYNAMIC DATA:  The left ventricular pressure was 146/17.  The aortic  pressure was 146/95 with a mean of 118.   CONCLUSION:  Nonobstructive coronary artery disease with 40% narrowing in  the proximal left anterior descending, no significant obstruction of  circumflex artery and irregularities is the right coronary above but with  TIMI-2 flow in all three vessels and normal left ventricular function.   RECOMMENDATIONS:  The etiology of the patient's chest pain and positive  troponin is not clear.  His chest pain has some features that are  characteristic of ischemia  but some that are not.  I discussed the situation  with Dr.  Jens Som.  Will plan to obtain a CT with contrast to rule out pulmonary  embolism and to rule out aortic dissection.  If this is negative, then will  give him a trial of calcium channel blockers to treat microvascular  dysfunction.  It is possible his symptoms could be related to microvascular  angina.           ______________________________  Everardo Beals. Juanda Chance, MD,FACC     BRB/MEDQ  D:  01/06/2006  T:  01/06/2006  Job:  [id number]   cc:   Madolyn Frieze. Jens Som, MD,FACC  [facility]  Cardiopulmonary Lab

## 2010-10-04 NOTE — Discharge Summary (Signed)
NAME:  Cody Brewer, Cody Brewer NO.:  [id number]   MEDICAL RECORD NO.:  [id number]          PATIENT TYPE:  INP   LOCATION:  2908                         FACILITY:  [facility]   PHYSICIAN:  Madolyn Frieze. Crenshaw, MD,FACCDATE OF BIRTH:  December 07, 1950   DATE OF ADMISSION:  01/05/2006  DATE OF DISCHARGE:                                 DISCHARGE SUMMARY   PRIMARY CARE PHYSICIAN:  Dr. Tenny Craw at [facility]   PRIMARY CARDIOLOGIST:  Dr. Olga Millers   PRINCIPAL DIAGNOSIS:  Non-ST-elevation myocardial infarction.   SECONDARY DIAGNOSES:  1. Nonobstructive coronary artery disease.  2. Hypertension.  3. Gastroesophageal reflux disease.  4. Hyperlipidemia.  5. Microcytosis.  6. History of hemorrhoids status post hemorrhoidectomy.   ALLERGIES:  No known drug allergies.   PROCEDURES:  Left heart cardiac catheterization and chest CT.   HISTORY OF PRESENT ILLNESS:  A 60 year old married African-American male  with prior history of chest pain in 2004 with catheterization performed at  that time revealing normal coronary arteries.  He presented to the ED on  January 05, 2006, with a 5- to 27-month history of exertional chest discomfort  relieved with rest that worsened at approximately 11 a.m. on the day of  admission and persisted for several hours prior to arrival at the ED.  In  the ED he continued to complain of 5/10 chest pain and he was noted to have  a CK-MB of 16.6 with a troponin-I of 0.9.  After initiation of IV  nitroglycerin he became pain-free.  His ECG showed no acute ST-T changes.  Notably, he was markedly hypertensive in the emergency room with systolic  blood pressures ranging from the 160s-180s over 90s-100s.   HOSPITAL COURSE:  Following admission, [name] peaked his CK at 424,  MB at 10.8, and troponin I at 1.87.  He underwent left heart cardiac  catheterization on August 21 revealing 40% stenosis in the proximal LAD and  otherwise normal coronary arteries.   EF was 70%.  It was noted that he had  TIMI 2 flow down his vessels and it was felt that he may have microvascular  disease in the setting of marked hypertension to explain his symptoms and  troponin elevation.  He was therefore initiated on calcium channel blocker  therapy and our attention focused on blood pressure management.  We switched  his beta blocker to labetalol therapy and we saw a improvement in blood  pressure, now ranging in the 130s-140s systolically.  He underwent a chest  CT this morning which was negative for PE or dissection.  He has not had any  recurrent chest discomfort or dyspnea and will be discharged home this  afternoon in satisfactory condition.   DISCHARGE LABORATORY:  Hemoglobin 13.0, hematocrit 39.2, wbc 9.8, platelets  280.  Sodium 137, potassium 3.6, chloride 104, CO2 27, BUN 9, creatinine  1.0, glucose 125.  CK 263, MB 5.2, troponin I 0.97.  Hemoglobin A1c 6.0.  Total cholesterol 160, triglycerides 71, HDL 61, LDL 85.  TSH 1.571.   DISPOSITION:  The patient is being discharged home today in good condition.  FOLLOWUP PLANS AND APPOINTMENTS:  He is asked to see his primary care  physician, Dr. Tenny Craw, in the next 3-4 weeks for followup on microcytosis with  an MCV of 77.  His has follow up with [name] on September 7 at 12 p.m.   DISCHARGE MEDICATIONS:  1. Aspirin 81 mg daily.  2. Lipitor 80 mg q.h.s.  3. Labetalol 200 mg two tablets b.i.d.  4. Diltia XT 180 mg daily.  5. Lisinopril 40 mg daily.   OUTSTANDING LABORATORY STUDIES:  Urinalysis is currently pending and will be  addressed prior to the patient's discharge.   DURATION DISCHARGE ENCOUNTER:  45 minutes including physician time.     ______________________________  Nicolasa Ducking, ANP    ______________________________  Madolyn Frieze. Jens Som, MD,FACC    CB/MEDQ  D:  01/07/2006  T:  01/07/2006  Job:  [id number]   cc:   C. Duane Lope, M.D.

## 2010-10-04 NOTE — H&P (Signed)
NAME:  Cody Brewer, Cody Brewer NO.:  [id number]   MEDICAL RECORD NO.:  [id number]          PATIENT TYPE:   LOCATION:                                 FACILITY:   PHYSICIAN:  Madolyn Frieze. Crenshaw, MD,FACCDATE OF BIRTH:  17-Feb-1951   DATE OF ADMISSION:  01/05/2006  DATE OF DISCHARGE:                                HISTORY & PHYSICAL   PRIMARY CARDIOLOGIST:  New to [facility] and being seen by Dr.  Jens Som.   PRIMARY CARE PHYSICIAN:  [facility].  He sees a Dr. Tenny Craw or a Dr.  Clarene Duke over there.   PATIENT PROFILE:  A 60 year old married African American male with no prior  history of CAD who presents with non-ST-elevation MI.   PROBLEMS:  1. Non-ST-elevation myocardial infarction.      a.     In  2004, he had a Cardiolite that showed an EF of 50% with       anterior ischemia.      b.     August 2004, he had a cardiac catheterization performed by Dr.       Meade Maw that showed normal coronary arteries.  2. Hypertension.  3. Gastroesophageal reflux disease.  4. Status post hemorrhoidectomy.   HISTORY OF PRESENT ILLNESS:  A 60 year old married African-American male  with prior history of chest pain in 2004 with cardiac catheterization  performed by Dr. Meade Maw, at that time showing normal coronary  arteries in August 2004.  He wished to be seen by [facility] today.  He reports a 5 to 50-month history of exertional chest pain relieved with  rest, occurring several times per week.  He said he thought this was more  musculoskeletal in nature because it was always resolved by resting and  stretching his chest.  This morning at approximately 10:30 to 11 o'clock, he  was at work and, while performing some exertional activity, developed 9/10  substernal chest tightness with radiation to his throat, associated  diaphoresis.  He sat and took Production designer, theatre/television/film with minimal relief  and then had a friend drive him home.  Once at home, he continued  to have  7/10 chest pain and tightness.  He took a shower and when he did not feel  any better, he had his wife drive him in to the ED.   Following arrival to the ED, ECG is without acute changes; however, cardiac  markers were abnormal with an MB of 16.6 and troponin I 0.89.  He is being  admitted for non-ST-elevation MI.  Initially he complained of 5/10 chest  pain despite 10 mg nitroglycerin,  but after about 30 minutes, he is now  pain free.   ALLERGIES:  No known drug allergies.   HOME MEDICATIONS:  Lotrel at unknown dose and fish oil daily.   FAMILY HISTORY:  Mother died at age 62, diabetes.  Father died at age 63 of  CVA.  He has eight brothers and three sisters.  There is a history of  stroke, cancer, CAD, and diabetes in his siblings.   SOCIAL HISTORY:  Lives in [place] with  his wife.  He works as a Cabin crew. He is two grown children.  He does not smoke tobacco.  Does not use  alcohol or drugs.  Does not routinely exercise.   REVIEW OF SYSTEMS:  Positive for chest pain associated with diaphoresis that  occurred earlier today.  Otherwise all systems reviewed and negative.   PHYSICAL EXAMINATION:  VITAL SIGNS: Temperature 98, heart rate 92,  respirations 16, blood pressure 162/90.  GENERAL:  A pleasant African male in no acute distress.  Awake, alert and  oriented x3.  NECK:  No bruits or JVD.  LUNGS:  Respirations regular and unlabored.  Clear to auscultation.  CARDIAC:  Regular S1 and S2.  He does have an S4.  There are no murmurs.  ABDOMEN: Round, soft, nontender, nondistended. Bowel sounds present x4.  EXTREMITIES: Warm and dry without clubbing, cyanosis or edema.  Dorsalis  pedis and posterior tibial pulses 2+ and equal bilaterally.  He has no  femoral bruits.   Chest x-ray shows no acute disease.   EKG shows sinus rhythm with a rate of 84, normal axis, and minimal ST  elevation in V2 and V3, likely early repolarization.   Lab work: Hemoglobin 13.8,  hematocrit 41.1, WBC 77, platelets 283.  Sodium  143, potassium 4.1, chloride 108, CO2 28, BUN 11, creatinine 1.0, glucose  110, total bilirubin 0.8, alkaline phosphatase 77, AST 26, ALT 18, total  protein 6.6, albumin 3.4. CK-MB 16.6, troponin I 0.89.   ASSESSMENT AND PLAN:  1. Non-ST-elevation myocardial infarction with 5 to 23-month history of      exertional angina culminating in chest pain today x6 hours.  He had      positive cardiac markers with MB of 16.6, troponin I of 0.89.  He had      5/10 pain initially when he came to the ED but now reports being pain      free.  He otherwise looks comfortable.  Blood pressure is very high and      will titrate nitroglycerin.  Will add heparin, beta blocker, aspirin,      statin, ACE inhibitor.  Plan cardiac catheterization in the a.m. or      sooner if pain recurs.  Cycle cardiac markers.  We have discussed the      case with [facility], and he will be evaluated with a      SEPIA trial.  2. Hypertension.  Add beta blocker, ACE inhibitor and titrate      nitroglycerin.  3. Lipid status currently unknown.  Check lipids and LFTs. Add Lipitor 80.  4. Gastroesophageal reflux disease.  Add PPI.     ______________________________  Nicolasa Ducking, ANP    ______________________________  Madolyn Frieze. Jens Som, MD,FACC    CB/MEDQ  D:  01/05/2006  T:  01/05/2006  Job:  [id number]

## 2010-10-04 NOTE — Assessment & Plan Note (Signed)
[facility]                              CARDIOLOGY OFFICE NOTE   NAME:Cody Brewer, Cody Brewer                   MRN:          [id number]  DATE:01/23/2006                            DOB:          1951-02-29    [name] is a gentleman who was recently admitted to [facility] with chest pain.  His cardiac enzymes were consistent with a  subendocardial myocardial infarction with a CK-MB of 10.8 and a troponin-I  of 1.87.  He underwent cardiac catheterization on January 06, 2006.  He was  found to have nonobstructive coronary disease.  However, there was TIMI 2  flow in all of his blood vessels.  His ejection fraction was 70% with no  areas of hypokinesis.  He also had chest CT that showed emphysema but no  pulmonary embolus or dissection was noted.  He was, therefore, treated for  possible microvascular disease.  Since discharge, he has not had any chest  pain similar to his infarct pain.  Note, he discontinued his Lipitor  secondary to leg pain.  He has not complained of dyspnea or syncope.   MEDICATIONS:  Include  1. Aspirin 81 mg p.o. daily.  2. Labetalol 200 mg tablets, 2 p.o. b.i.d.  3. Cardizem XT 180 mg, 2 p.o. q. day.  4. Lisinopril 40 mg q. day.   PHYSICAL EXAMINATION:  VITAL SIGNS:  Shows a blood pressure of 152/102 and  his pulse is 74.  He weighs 196 pounds.  CHEST:  Clear.  CARDIOVASCULAR:  Regular rate rhythm.  GROIN:  His right groin shows no hematoma, no bruits.  EXTREMITIES:  Show no edema.   His electrocardiogram shows a sinus rhythm at a rate of 74.  There are no ST  changes noted.   DIAGNOSES:  1. A recent subendocardial infarction, possibly secondary to microvascular      disease versus spasm.  2. Hypertension.  3. Microcytosis.  4. Gastroesophageal reflux disease.  5. History of hyperlipidemia.   PLAN:  [name] is doing well from a symptomatic standpoint.  However,  his blood pressure remains elevated.   I have asked him to increase his  Cartia XT to 360 mg p.o. daily and we will also add hydrochlorothiazide 12.5  mg p.o. q. day.  We will check BMP in one week, follow his potassium and  renal function.  He discontinued his Lipitor secondary to  myalgias and we  will begin Zocor 40 mg p.o. at bedtime to see if he tolerates.  If so, we  will check lipids and liver in 6 weeks and adjust with a goal LDL of less  than 70.  He does not smoke.  He understands exercise and diet.  Note, he  had mild microcytosis while in the hospital and I have asked him to follow  up with his primary care physician for this issue.  He apparently has had a  GI evaluation in the past but may need a repeat study.  We will see him back  in 8 weeks to make sure that his blood pressure is controlled.  Madolyn Frieze Jens Som, MD, [facility]    BSC/MedQ  DD:  01/23/2006  DT:  01/23/2006  Job #:  [id number]

## 2012-04-07 ENCOUNTER — Emergency Department (HOSPITAL_COMMUNITY): Payer: 59

## 2012-04-07 ENCOUNTER — Inpatient Hospital Stay (HOSPITAL_COMMUNITY)
Admission: EM | Admit: 2012-04-07 | Discharge: 2012-04-09 | DRG: 281 | Disposition: A | Discharge status: Home / Self Care | Payer: 59 | Attending: Internal Medicine | Admitting: Internal Medicine

## 2012-04-07 ENCOUNTER — Encounter (HOSPITAL_COMMUNITY): Payer: Self-pay | Admitting: Emergency Medicine

## 2012-04-07 DIAGNOSIS — F419 Anxiety disorder, unspecified: Secondary | ICD-10-CM

## 2012-04-07 DIAGNOSIS — F411 Generalized anxiety disorder: Secondary | ICD-10-CM | POA: Diagnosis present

## 2012-04-07 DIAGNOSIS — R229 Localized swelling, mass and lump, unspecified: Secondary | ICD-10-CM | POA: Diagnosis present

## 2012-04-07 DIAGNOSIS — K219 Gastro-esophageal reflux disease without esophagitis: Secondary | ICD-10-CM | POA: Diagnosis present

## 2012-04-07 DIAGNOSIS — D509 Iron deficiency anemia, unspecified: Secondary | ICD-10-CM | POA: Diagnosis present

## 2012-04-07 DIAGNOSIS — N400 Enlarged prostate without lower urinary tract symptoms: Secondary | ICD-10-CM | POA: Diagnosis present

## 2012-04-07 DIAGNOSIS — I498 Other specified cardiac arrhythmias: Principal | ICD-10-CM | POA: Diagnosis present

## 2012-04-07 DIAGNOSIS — R718 Other abnormality of red blood cells: Secondary | ICD-10-CM

## 2012-04-07 DIAGNOSIS — D72829 Elevated white blood cell count, unspecified: Secondary | ICD-10-CM | POA: Diagnosis present

## 2012-04-07 DIAGNOSIS — I214 Non-ST elevation (NSTEMI) myocardial infarction: Secondary | ICD-10-CM | POA: Diagnosis present

## 2012-04-07 DIAGNOSIS — G4700 Insomnia, unspecified: Secondary | ICD-10-CM | POA: Diagnosis present

## 2012-04-07 DIAGNOSIS — Z7982 Long term (current) use of aspirin: Secondary | ICD-10-CM

## 2012-04-07 DIAGNOSIS — I471 Supraventricular tachycardia, unspecified: Secondary | ICD-10-CM | POA: Diagnosis present

## 2012-04-07 DIAGNOSIS — R079 Chest pain, unspecified: Secondary | ICD-10-CM

## 2012-04-07 DIAGNOSIS — E785 Hyperlipidemia, unspecified: Secondary | ICD-10-CM | POA: Diagnosis present

## 2012-04-07 DIAGNOSIS — I2489 Other forms of acute ischemic heart disease: Secondary | ICD-10-CM | POA: Diagnosis present

## 2012-04-07 DIAGNOSIS — Z79899 Other long term (current) drug therapy: Secondary | ICD-10-CM

## 2012-04-07 DIAGNOSIS — E876 Hypokalemia: Secondary | ICD-10-CM | POA: Diagnosis present

## 2012-04-07 DIAGNOSIS — I10 Essential (primary) hypertension: Secondary | ICD-10-CM | POA: Diagnosis present

## 2012-04-07 DIAGNOSIS — D649 Anemia, unspecified: Secondary | ICD-10-CM | POA: Diagnosis present

## 2012-04-07 DIAGNOSIS — I248 Other forms of acute ischemic heart disease: Secondary | ICD-10-CM | POA: Diagnosis present

## 2012-04-07 DIAGNOSIS — I2510 Atherosclerotic heart disease of native coronary artery without angina pectoris: Secondary | ICD-10-CM | POA: Diagnosis present

## 2012-04-07 HISTORY — DX: Anemia, unspecified: D64.9

## 2012-04-07 HISTORY — DX: Essential (primary) hypertension: I10

## 2012-04-07 LAB — CBC WITH DIFFERENTIAL/PLATELET
Eosinophils Absolute: 0 10*3/uL (ref 0.0–0.7)
Hemoglobin: 14.8 g/dL (ref 13.0–17.0)
Lymphocytes Relative: 9 % — ABNORMAL LOW (ref 12–46)
Lymphs Abs: 1.1 10*3/uL (ref 0.7–4.0)
MCH: 25.2 pg — ABNORMAL LOW (ref 26.0–34.0)
Neutro Abs: 10.7 10*3/uL — ABNORMAL HIGH (ref 1.7–7.7)
Neutrophils Relative %: 86 % — ABNORMAL HIGH (ref 43–77)
Platelets: 239 10*3/uL (ref 150–400)
RBC: 5.88 MIL/uL — ABNORMAL HIGH (ref 4.22–5.81)
WBC: 12.4 10*3/uL — ABNORMAL HIGH (ref 4.0–10.5)

## 2012-04-07 LAB — RAPID URINE DRUG SCREEN, HOSP PERFORMED: Opiates: NOT DETECTED

## 2012-04-07 LAB — PRO B NATRIURETIC PEPTIDE: Pro B Natriuretic peptide (BNP): 2216 pg/mL — ABNORMAL HIGH (ref 0–125)

## 2012-04-07 LAB — MRSA PCR SCREENING: MRSA by PCR: NEGATIVE

## 2012-04-07 LAB — URINALYSIS, ROUTINE W REFLEX MICROSCOPIC
Glucose, UA: 500 mg/dL — AB
Leukocytes, UA: NEGATIVE
Protein, ur: >300 mg/dL — AB
Specific Gravity, Urine: 1.025 (ref 1.005–1.030)
Urobilinogen, UA: 1 mg/dL (ref 0.0–1.0)

## 2012-04-07 LAB — BASIC METABOLIC PANEL
Chloride: 102 mEq/L (ref 96–112)
GFR calc Af Amer: >90 mL/min (ref >90)
GFR calc non Af Amer: >90 mL/min (ref >90)
Glucose, Bld: 101 mg/dL — ABNORMAL HIGH (ref 70–99)
Potassium: 4.4 mEq/L (ref 3.5–5.1)
Sodium: 141 mEq/L (ref 135–145)

## 2012-04-07 LAB — URINE MICROSCOPIC-ADD ON

## 2012-04-07 LAB — PROTIME-INR: INR: 1.11 (ref 0.00–1.49)

## 2012-04-07 LAB — CK TOTAL AND CKMB (NOT AT ARMC): Total CK: 842 U/L — ABNORMAL HIGH (ref 7–232)

## 2012-04-07 MED ORDER — ACETAMINOPHEN 325 MG PO TABS
650.0000 mg | ORAL_TABLET | Freq: Four times a day (QID) | ORAL | Status: DC | PRN
  Administered 2012-04-08 (×2): 650 mg via ORAL
  Filled 2012-04-07 (×2): qty 2

## 2012-04-07 MED ORDER — MORPHINE SULFATE 2 MG/ML IJ SOLN
1.0000 mg | INTRAMUSCULAR | Status: DC | PRN
  Administered 2012-04-08: 2 mg via INTRAVENOUS
  Filled 2012-04-07: qty 1

## 2012-04-07 MED ORDER — SODIUM CHLORIDE 0.9 % IV SOLN: INTRAVENOUS | Status: DC

## 2012-04-07 MED ORDER — SODIUM CHLORIDE 0.9 % IJ SOLN: 3.0000 mL | INTRAMUSCULAR | Status: DC | PRN

## 2012-04-07 MED ORDER — LABETALOL HCL 100 MG PO TABS
100.0000 mg | ORAL_TABLET | Freq: Once | ORAL | Status: AC
  Administered 2012-04-07: 100 mg via ORAL
  Filled 2012-04-07: qty 1

## 2012-04-07 MED ORDER — SODIUM CHLORIDE 0.9 % IV SOLN: 250.0000 mL | INTRAVENOUS | Status: DC | PRN

## 2012-04-07 MED ORDER — HEPARIN (PORCINE) IN NACL 100-0.45 UNIT/ML-% IJ SOLN
1550.0000 [IU]/h | INTRAMUSCULAR | Status: DC
  Administered 2012-04-07: 1250 [IU]/h via INTRAVENOUS
  Filled 2012-04-07 (×2): qty 250

## 2012-04-07 MED ORDER — PANTOPRAZOLE SODIUM 40 MG PO TBEC
40.0000 mg | DELAYED_RELEASE_TABLET | Freq: Every day | ORAL | Status: DC
  Administered 2012-04-08 – 2012-04-09 (×3): 40 mg via ORAL
  Filled 2012-04-07 (×3): qty 1

## 2012-04-07 MED ORDER — SODIUM CHLORIDE 0.9 % IV BOLUS (SEPSIS)
1000.0000 mL | Freq: Once | INTRAVENOUS | Status: AC
  Administered 2012-04-07: 1000 mL via INTRAVENOUS

## 2012-04-07 MED ORDER — METOPROLOL TARTRATE 25 MG PO TABS
25.0000 mg | ORAL_TABLET | Freq: Two times a day (BID) | ORAL | Status: DC
  Administered 2012-04-08 (×2): 25 mg via ORAL
  Filled 2012-04-07 (×4): qty 1

## 2012-04-07 MED ORDER — OXYCODONE HCL 5 MG PO TABS
5.0000 mg | ORAL_TABLET | ORAL | Status: DC | PRN
  Administered 2012-04-08: 5 mg via ORAL
  Filled 2012-04-07: qty 1

## 2012-04-07 MED ORDER — SODIUM CHLORIDE 0.9 % IV SOLN
1.0000 mL/kg/h | INTRAVENOUS | Status: DC
  Administered 2012-04-08: 1 mL/kg/h via INTRAVENOUS

## 2012-04-07 MED ORDER — ATORVASTATIN CALCIUM 10 MG PO TABS
10.0000 mg | ORAL_TABLET | Freq: Every day | ORAL | Status: DC
  Administered 2012-04-08: 18:00:00 10 mg via ORAL
  Filled 2012-04-07 (×3): qty 1

## 2012-04-07 MED ORDER — DILTIAZEM HCL 30 MG PO TABS
30.0000 mg | ORAL_TABLET | Freq: Once | ORAL | Status: AC
  Administered 2012-04-07: 30 mg via ORAL
  Filled 2012-04-07: qty 1

## 2012-04-07 MED ORDER — NITROGLYCERIN 0.4 MG SL SUBL
0.4000 mg | SUBLINGUAL_TABLET | SUBLINGUAL | Status: DC | PRN
  Administered 2012-04-07: 0.4 mg via SUBLINGUAL
  Filled 2012-04-07: qty 25

## 2012-04-07 MED ORDER — ADENOSINE 6 MG/2ML IV SOLN
6.0000 mg | Freq: Once | INTRAVENOUS | Status: AC
  Administered 2012-04-07: 6 mg via INTRAVENOUS
  Filled 2012-04-07: qty 6

## 2012-04-07 MED ORDER — ASPIRIN 81 MG PO CHEW
162.0000 mg | CHEWABLE_TABLET | Freq: Once | ORAL | Status: AC
  Administered 2012-04-07: 162 mg via ORAL
  Filled 2012-04-07: qty 2

## 2012-04-07 MED ORDER — ONDANSETRON HCL 4 MG PO TABS: 4.0000 mg | ORAL_TABLET | Freq: Four times a day (QID) | ORAL | Status: DC | PRN

## 2012-04-07 MED ORDER — SODIUM CHLORIDE 0.9 % IJ SOLN
3.0000 mL | Freq: Two times a day (BID) | INTRAMUSCULAR | Status: DC
  Administered 2012-04-08: 3 mL via INTRAVENOUS

## 2012-04-07 MED ORDER — HEPARIN BOLUS VIA INFUSION: 4000.0000 [IU] | Freq: Once | INTRAVENOUS | Status: DC

## 2012-04-07 MED ORDER — ACETAMINOPHEN 650 MG RE SUPP: 650.0000 mg | Freq: Four times a day (QID) | RECTAL | Status: DC | PRN

## 2012-04-07 MED ORDER — ASPIRIN 81 MG PO CHEW
162.0000 mg | CHEWABLE_TABLET | Freq: Every day | ORAL | Status: DC
  Administered 2012-04-08 – 2012-04-09 (×2): 162 mg via ORAL
  Filled 2012-04-07 (×3): qty 2

## 2012-04-07 MED ORDER — NITROGLYCERIN IN D5W 200-5 MCG/ML-% IV SOLN
2.0000 ug/min | INTRAVENOUS | Status: DC
  Administered 2012-04-07: 10 ug/min via INTRAVENOUS
  Filled 2012-04-07: qty 250

## 2012-04-07 MED ORDER — SENNOSIDES-DOCUSATE SODIUM 8.6-50 MG PO TABS: 1.0000 | ORAL_TABLET | Freq: Every evening | ORAL | Status: DC | PRN

## 2012-04-07 MED ORDER — ASPIRIN 81 MG PO CHEW
324.0000 mg | CHEWABLE_TABLET | ORAL | Status: AC
  Administered 2012-04-08: 324 mg via ORAL
  Filled 2012-04-07: qty 4

## 2012-04-07 MED ORDER — ONDANSETRON HCL 4 MG/2ML IJ SOLN: 4.0000 mg | Freq: Four times a day (QID) | INTRAMUSCULAR | Status: DC | PRN

## 2012-04-07 NOTE — ED Notes (Signed)
Pt states he thought he was having some indigestion earlier, felt like heart was racing, and noted chest pain, took 2 81mg  asa and an alka seltzer and 1 nitro. Denies any relief in chest pain. Pain currently 8/10. Radiates down left arm. SOB. Denies diaphoresis, dizziness. Pt states he has had hx of same.

## 2012-04-07 NOTE — ED Notes (Signed)
Spoke with Dr. Shaune Spittle. Made him aware of pts elevated troponin, also requested for pt to be changed to stepdown due to continued CP.

## 2012-04-07 NOTE — ED Notes (Signed)
Pt noted to be in NSR.

## 2012-04-07 NOTE — ED Notes (Signed)
Pt noted to have converted, in NSR on monitor.

## 2012-04-07 NOTE — ED Notes (Signed)
Pt here with CP, SOB and palpitations starting this am; pt sts hx of similar; pt noted to tachycardic at 170bpm

## 2012-04-07 NOTE — ED Provider Notes (Addendum)
History     CSN: [id number]  Arrival date & time 04/07/12  1125   First MD Initiated Contact with Patient 04/07/12 1143      Chief Complaint  Patient presents with  . Chest Pain    (Consider location/radiation/quality/duration/timing/severity/associated sxs/prior treatment) HPI Comments: Pt comes in with cc of palpitations. Pt has hx of SVT in the past, he has hx of non obstructive CAD, Left atrial enlargement. Pt reports that this morning, he started feeling like his heart was racing and he had associated sob, some chest discomfort. Pt has been taking his meds as prescribed, no new changes and he denies any recent infections, illicit use.  Patient is a 61 y.o. male presenting with chest pain. The history is provided by the patient.  Chest Pain Primary symptoms include shortness of breath, palpitations and dizziness. Pertinent negatives for primary symptoms include no fever and no cough.  The palpitations also occurred with dizziness and shortness of breath.     Past Medical History  Diagnosis Date  . Hypertension     History reviewed. No pertinent past surgical history.  History reviewed. No pertinent family history.  History  Substance Use Topics  . Smoking status: Never Smoker   . Smokeless tobacco: Not on file  . Alcohol Use: No      Review of Systems  Constitutional: Negative for fever, chills and activity change.  HENT: Negative for neck pain.   Eyes: Negative for visual disturbance.  Respiratory: Positive for shortness of breath. Negative for cough and chest tightness.   Cardiovascular: Positive for chest pain and palpitations.  Gastrointestinal: Negative for abdominal distention.  Genitourinary: Negative for dysuria, enuresis and difficulty urinating.  Musculoskeletal: Negative for arthralgias.  Neurological: Positive for dizziness and light-headedness. Negative for headaches.  Psychiatric/Behavioral: Negative for confusion.    Allergies  Review of  patient's allergies indicates no known allergies.  Home Medications  No current outpatient prescriptions on file.  There were no vitals taken for this visit.  Physical Exam  Nursing note and vitals reviewed. Constitutional: He is oriented to person, place, and time. He appears well-developed.  HENT:  Head: Normocephalic and atraumatic.  Eyes: Conjunctivae normal and EOM are normal. Pupils are equal, round, and reactive to light.  Neck: Normal range of motion. Neck supple. No JVD present.  Cardiovascular: Normal heart sounds.   No murmur heard. Pulmonary/Chest: Effort normal and breath sounds normal. No respiratory distress. He has no wheezes.  Abdominal: Soft. Bowel sounds are normal. He exhibits no distension. There is no tenderness. There is no rebound and no guarding.  Neurological: He is alert and oriented to person, place, and time.  Skin: Skin is warm.    ED Course  Procedures (including critical care time)   Labs Reviewed  CBC WITH DIFFERENTIAL  BASIC METABOLIC PANEL  MAGNESIUM  URINALYSIS, ROUTINE W REFLEX MICROSCOPIC  URINE RAPID DRUG SCREEN (HOSP PERFORMED)  TROPONIN I   No results found.   No diagnosis found.    MDM   Date: 04/07/2012  Rate: 167  Rhythm: supraventricular tachycardia (SVT)  QRS Axis: left  Intervals: normal  ST/T Wave abnormalities: nonspecific ST changes  Conduction Disutrbances:none  Narrative Interpretation:   Old EKG Reviewed: changes noted   Pt comes in with cc of palpitations, and noted to have SVT. Pt has hx of SVT, no precipitating factor per hx and exam. Pt has some chest discomfort and is sob. Will get basic labs, and will hydrate and give adenosine.  Date: 04/07/2012  Rate: 84  Rhythm: normal sinus rhythm  QRS Axis: left  Intervals: normal  ST/T Wave abnormalities: nonspecific ST/T changes and ST depressions anteriorly  Conduction Disutrbances:left bundle branch block  Narrative Interpretation:   Old EKG  Reviewed: none available - Pt has LBBB, with some ST depression and we will repeat the ekg. Adenosine converted patient to sinus rhythm. No ekf to compare this with.    Derwood Kaplan, MD 04/07/12 1217  3:59 PM Pt had 3 separate episodes of PSVT, 2 after adenosine was given, and each time he overtime spontaneously improved. Pt reports that he missed his am dose, but doesn't know what they are. Old records indicate diltiazem and labetalol - we will start hime on diltiazem short acting 30 mg. Pt also reports chest pains with these episodes, so we will give nitro and admit.   CRITICAL CARE Performed by: Derwood Kaplan   Total critical care time: 35 minutes  Critical care time was exclusive of separately billable procedures and treating other patients.  Critical care was necessary to treat or prevent imminent or life-threatening deterioration.  Critical care was time spent personally by me on the following activities: development of treatment plan with patient and/or surrogate as well as nursing, discussions with consultants, evaluation of patient's response to treatment, examination of patient, obtaining history from patient or surrogate, ordering and performing treatments and interventions, ordering and review of laboratory studies, ordering and review of radiographic studies, pulse oximetry and re-evaluation of patient's condition.   Derwood Kaplan, MD 04/07/12 1601  4:34 PM Now admits to non compliance x 2 months. Admitting to obs for intermittent psvt and med optimization.  Derwood Kaplan, MD 04/07/12 1634  5:09 PM  Date: 04/07/2012  Rate: 83  Rhythm: normal sinus rhythm and premature ventricular contractions (PVC)  QRS Axis: left  Intervals: normal  ST/T Wave abnormalities: nonspecific ST/T changes  Conduction Disutrbances:none  Narrative Interpretation:   Old EKG Reviewed: changes noted - ST elevation, more like J point elevation in the anterior leads.  LATE  ENTRY: pT'S TROPONIN REPEAT WAS MORE THAN 3. Repeat EKG does have some changes as noted above. [facility] Cards consulted, heparin started.   Derwood Kaplan, MD 04/10/12 [phone]

## 2012-04-07 NOTE — ED Notes (Signed)
Pt noted to be in SVT again, Hr 160, MD notified.

## 2012-04-07 NOTE — ED Notes (Signed)
Pt noted to be in SVT. MD notified.

## 2012-04-07 NOTE — Progress Notes (Addendum)
ANTICOAGULATION CONSULT NOTE - Initial Consult  Pharmacy Consult for heparin Indication: chest pain/ACS  No Known Allergies  Patient Measurements: Height: 6\' 1"  (185.4 cm) Weight: 195 lb (88.451 kg) IBW/kg (Calculated) : 79.9  Heparin Dosing Weight: 88 kg  Vital Signs: Temp: 98.1 F (36.7 C) (11/20 1140) Temp src: Oral (11/20 1140) BP: 175/115 mmHg (11/20 1533) Pulse Rate: 87  (11/20 1533)  Labs:  Basename 04/07/12 1554 04/07/12 1145  HGB -- 14.8  HCT -- 44.8  PLT -- 239  APTT -- --  LABPROT -- --  INR -- --  HEPARINUNFRC -- --  CREATININE -- 0.90  CKTOTAL -- --  CKMB -- --  TROPONINI 3.28* <0.30    Estimated Creatinine Clearance: 98.6 ml/min (by C-G formula based on Cr of 0.9).   Medical History: Past Medical History  Diagnosis Date  . Hypertension     Medications:  Asa, sildenafil, tamsulosin, terbinafine  Assessment: 61 year old man with chest pain to start on IV heparin for ACS.  Troponin is positive. Goal of Therapy:  Heparin level 0.3-0.7 units/ml Monitor platelets by anticoagulation protocol: Yes   Plan:  Give 4000 units bolus x 1 Start heparin infusion at 1250 units/hr Check anti-Xa level in 6 hours and daily while on heparin Continue to monitor H&H and platelets Daily heparin level while on heparin.  Mickeal Skinner 04/07/2012,5:30 PM  Addendum - will not give a heparin bolus per MD request. Celedonio Miyamoto, PharmD, BCPS Clinical Pharmacist Pager [phone]

## 2012-04-07 NOTE — H&P (Signed)
[facility]          History and Physical    PCP:   [name], MD   Chief Complaint:  Chest pain, palpitations  HPI: 61 y/o man with a h/o CAD with last cath in August 2010 that revealed a 50% second diagonal and a 70% mid to distal LAD due to spasm and compression. It was felt that medical therapy was indicated. He has had intermittent CP ever since. This am at about 8, he starting having CP again that he describes as SS, radiating into his left shoulder with tingling in his fingertips. In the ED he was noted to be in SVT with HRs in the 150-160s. Twice he converted to NSR spontaneously, a third time he converted after given cardizem (adenosine not used). The EDP has asked [place] to admit him for initiation of medications. Afterwards, a troponin has returned positive at 3.28. CP has now returned and EKG shows some ant TWI and mild ST depressions. Cardiology has been consulted.   Allergies:  No Known Allergies    Past Medical History  Diagnosis Date  . Hypertension     History reviewed. No pertinent past surgical history.  Prior to Admission medications   Medication Sig Start Date End Date Taking? Authorizing Provider  aspirin 81 MG chewable tablet Chew 162 mg by mouth daily.   Yes Historical Provider, MD  sildenafil (VIAGRA) 100 MG tablet Take 100 mg by mouth daily as needed. For erectile dysfunction.   Yes Historical Provider, MD  Tamsulosin HCl (FLOMAX) 0.4 MG CAPS Take 0.4 mg by mouth daily.   Yes Historical Provider, MD  terbinafine (LAMISIL) 250 MG tablet Take 250 mg by mouth daily.   Yes Historical Provider, MD    Social History:  reports that he has never smoked. He does not have any smokeless tobacco history on file. He reports that he does not drink alcohol or use illicit drugs.  History reviewed. No pertinent family history.  Review of Systems:  Constitutional: Denies fever, chills, diaphoresis, appetite change and fatigue.  HEENT: Denies  photophobia, eye pain, redness, hearing loss, ear pain, congestion, sore throat, rhinorrhea, sneezing, mouth sores, trouble swallowing, neck pain, neck stiffness and tinnitus.   Respiratory: Denies SOB, DOE, cough,  and wheezing.   Cardiovascular: Positive for chest pain, palpitations and leg swelling.  Gastrointestinal: Denies nausea, vomiting, abdominal pain, diarrhea, constipation, blood in stool and abdominal distention.  Genitourinary: Denies dysuria, urgency, frequency, hematuria, flank pain and difficulty urinating.  Musculoskeletal: Denies myalgias, back pain, joint swelling, arthralgias and gait problem.  Skin: Denies pallor, rash and wound.  Neurological: Denies dizziness, seizures, syncope, weakness, light-headedness, numbness and headaches.  Hematological: Denies adenopathy. Easy bruising, personal or family bleeding history  Psychiatric/Behavioral: Denies suicidal ideation, mood changes, confusion, nervousness, sleep disturbance and agitation   Physical Exam: Blood pressure 175/115, pulse 87, temperature 98.1 F (36.7 C), temperature source Oral, resp. rate 19, height 6\' 1"  (1.854 m), weight 88.451 kg (195 lb), SpO2 99.00%. Gen: AA Ox3, currently c/o CP. HEENT: [place]/AT/PERRL/EOMI/moist mucous membranes. Neck: no JVD, no LAD, no bruits/ no goiter. CV: RRR, no M/R/G. Lungs: CTA B Abd: S/NT/ND/+BS/no masses or organomegaly noted. Ext: no C/C/E, +pedal pulses. Neuro: grossly intact and non-focal.  Labs on Admission:  Results for orders placed during the hospital encounter of 04/07/12 (from the past 48 hour(s))  CBC WITH DIFFERENTIAL     Status: Abnormal   Collection Time   04/07/12 11:45 AM  Component Value Range Comment   WBC 12.4 (*) 4.0 - 10.5 K/uL    RBC 5.88 (*) 4.22 - 5.81 MIL/uL    Hemoglobin 14.8  13.0 - 17.0 g/dL    HCT 38.7  56.4 - 33.2 %    MCV 76.2 (*) 78.0 - 100.0 fL    MCH 25.2 (*) 26.0 - 34.0 pg    MCHC 33.0  30.0 - 36.0 g/dL    RDW 95.1  88.4 - 16.6 %      Platelets 239  150 - 400 K/uL    Neutrophils Relative 86 (*) 43 - 77 %    Neutro Abs 10.7 (*) 1.7 - 7.7 K/uL    Lymphocytes Relative 9 (*) 12 - 46 %    Lymphs Abs 1.1  0.7 - 4.0 K/uL    Monocytes Relative 5  3 - 12 %    Monocytes Absolute 0.6  0.1 - 1.0 K/uL    Eosinophils Relative 0  0 - 5 %    Eosinophils Absolute 0.0  0.0 - 0.7 K/uL    Basophils Relative 0  0 - 1 %    Basophils Absolute 0.0  0.0 - 0.1 K/uL   BASIC METABOLIC PANEL     Status: Abnormal   Collection Time   04/07/12 11:45 AM      Component Value Range Comment   Sodium 141  135 - 145 mEq/L    Potassium 4.4  3.5 - 5.1 mEq/L    Chloride 102  96 - 112 mEq/L    CO2 27  19 - 32 mEq/L    Glucose, Bld 101 (*) 70 - 99 mg/dL    BUN 17  6 - 23 mg/dL    Creatinine, Ser 0.63  0.50 - 1.35 mg/dL    Calcium 9.6  8.4 - 01.6 mg/dL    GFR calc non Af Amer >90  >90 mL/min    GFR calc Af Amer >90  >90 mL/min   MAGNESIUM     Status: Normal   Collection Time   04/07/12 11:45 AM      Component Value Range Comment   Magnesium 2.2  1.5 - 2.5 mg/dL   TROPONIN I     Status: Normal   Collection Time   04/07/12 11:45 AM      Component Value Range Comment   Troponin I <0.30  <0.30 ng/mL   URINALYSIS, ROUTINE W REFLEX MICROSCOPIC     Status: Abnormal   Collection Time   04/07/12 11:46 AM      Component Value Range Comment   Color, Urine YELLOW  YELLOW    APPearance CLOUDY (*) CLEAR    Specific Gravity, Urine 1.025  1.005 - 1.030    pH 6.0  5.0 - 8.0    Glucose, UA 500 (*) NEGATIVE mg/dL    Hgb urine dipstick SMALL (*) NEGATIVE    Bilirubin Urine NEGATIVE  NEGATIVE    Ketones, ur 15 (*) NEGATIVE mg/dL    Protein, ur >010 (*) NEGATIVE mg/dL    Urobilinogen, UA 1.0  0.0 - 1.0 mg/dL    Nitrite NEGATIVE  NEGATIVE    Leukocytes, UA NEGATIVE  NEGATIVE   URINE RAPID DRUG SCREEN (HOSP PERFORMED)     Status: Abnormal   Collection Time   04/07/12 11:46 AM      Component Value Range Comment   Opiates NONE DETECTED  NONE DETECTED     Cocaine NONE DETECTED  NONE DETECTED    Benzodiazepines NONE  DETECTED  NONE DETECTED    Amphetamines NONE DETECTED  NONE DETECTED    Tetrahydrocannabinol POSITIVE (*) NONE DETECTED    Barbiturates NONE DETECTED  NONE DETECTED   URINE MICROSCOPIC-ADD ON     Status: Abnormal   Collection Time   04/07/12 11:46 AM      Component Value Range Comment   Squamous Epithelial / LPF RARE  RARE    WBC, UA 0-2  <3 WBC/hpf    RBC / HPF 3-6  <3 RBC/hpf    Bacteria, UA RARE  RARE    Crystals URIC ACID CRYSTALS (*) NEGATIVE   TROPONIN I     Status: Abnormal   Collection Time   04/07/12  3:54 PM      Component Value Range Comment   Troponin I 3.28 (*) <0.30 ng/mL     Radiological Exams on Admission: Dg Chest 2 View  04/07/2012  *RADIOLOGY REPORT*  Clinical Data: Chest pain.  Previous myocardial infarction.  CHEST - 2 VIEW  Comparison: The same day  Findings: Heart size is normal.  There is calcification and unfolding of the aorta.  Lungs are clear except for mild scarring. The vascularity is normal.  No effusions.  No bony abnormalities.  IMPRESSION: Mild pulmonary scarring.  No active disease.   Original Report Authenticated By: Paulina Fusi, M.D.    Dg Chest Port 1 View  04/07/2012  *RADIOLOGY REPORT*  Clinical Data: Chest pain with numbness and tingling.  PORTABLE CHEST - 1 VIEW  Comparison: 01/02/2009  Findings: Numerous leads and wires project over the chest.  Left costophrenic angle is minimally excluded.  Mild cardiomegaly. No pneumothorax.  No definite pleural fluid.  Suspect underlying hyperinflation.  Vague left suprahilar/medial upper lobe increased density is suspected.  The right lung is clear.  IMPRESSION: Vague increased density in the medial left upper lobe. Although this could be artifactual on this portable film, asymmetric pulmonary edema or early airspace disease cannot be excluded. Consider further evaluation with PA and lateral radiographs.   Original Report Authenticated By: Jeronimo Greaves, M.D.     Assessment/Plan Principal Problem:  *NSTEMI (non-ST elevated myocardial infarction) Active Problems:  Paroxysmal SVT (supraventricular tachycardia)  HYPERLIPIDEMIA  HYPERTENSION  GERD  CHEST PAIN   NSTEMI -Has already been started on a heparin drip. -Given ongoing CP, will start a nitro drip as well. (Will need to go to the SDU). -Continue to cycle troponins. -Get ECHO. -[facility] has been consulted for further recommendations. -Start ASA, BB, statin. -Has been non-complaint with meds for at least 2 months.  PSVT -Unsure if this was precipitated by NSTEMI, or whether the SVT caused the elevated troponins. -Has been given cardizem and labetalol. -Will place on BID metoprolol. -Is currently in NSR. -Further management as per cardiology.  GERD -Protonix.  Hyperlipidemia -Check FLP. -Start statin.  HTN -Should improve once nitro drip started. -Have also started on BB. -Will likely need further heart meds which will help lower BP as well.  DVT Prophylaxis -Has been started on a heparin drip for NSTEMI.    Time Spent on Admission: 70 minutes  HERNANDEZ ACOSTA,ESTELA [facility] Pager: [phone] 04/07/2012, 5:43 PM

## 2012-04-07 NOTE — Progress Notes (Signed)
CONSULT NOTE  Date: 04/07/2012               Patient Name:  Cody Brewer MRN: [id number]  DOB: [birth year] Age / Sex: 61 y.o., male        PCP: [name] Primary Cardiologist: Buck Mam            Referring Physician: Derwood Kaplan, MD              Reason for Consult: SVT, NSTEMI           History of Present Illness: Patient is a 61 y.o. male with a PMHx of SVT, who was admitted to [facility] on 04/07/2012 for evaluation of chest pain / indigestion tachypalpitations.   He took an Geneticist, molecular ( no relief) , 2 ASA, and 1 SL NTG ( didn't tingle much) no relief.  He received SL NTG here with some relief.  He was found to have SVT when he arrived here - mostly relieved with IV adneosine.   Pt has been on diltiazem and Labetalol in the past.  He has not taken meds in the last 3-4 months.  His initial Troponin was negative but a later troponin level was 3.28. Medications: Outpatient medications:  (Not in a hospital admission)  Current medications: Current Facility-Administered Medications  Medication Dose Route Frequency Provider Last Rate Last Dose  . [COMPLETED] adenosine (ADENOCARD) 6 MG/2ML injection 6 mg  6 mg Intravenous Once Derwood Kaplan, MD   6 mg at 04/07/12 1203  . [COMPLETED] aspirin chewable tablet 162 mg  162 mg Oral Once Derwood Kaplan, MD   162 mg at 04/07/12 1152  . [COMPLETED] diltiazem (CARDIZEM) tablet 30 mg  30 mg Oral Once Derwood Kaplan, MD   30 mg at 04/07/12 1438  . heparin ADULT infusion 100 units/mL (25000 units/250 mL)  1,250 Units/hr Intravenous Continuous Estela Isaiah Blakes, MD      . labetalol (NORMODYNE) tablet 100 mg  100 mg Oral Once Derwood Kaplan, MD      . nitroGLYCERIN (NITROSTAT) SL tablet 0.4 mg  0.4 mg Sublingual Q5 min PRN Derwood Kaplan, MD   0.4 mg at 04/07/12 1545  . [COMPLETED] sodium chloride 0.9 % bolus 1,000 mL  1,000 mL Intravenous Once Ankit Nanavati, MD   1,000 mL at 04/07/12 1150  . [DISCONTINUED] heparin  bolus via infusion 4,000 Units  4,000 Units Intravenous Once Estela Isaiah Blakes, MD       Current Outpatient Prescriptions  Medication Sig Dispense Refill  . aspirin 81 MG chewable tablet Chew 162 mg by mouth daily.      . sildenafil (VIAGRA) 100 MG tablet Take 100 mg by mouth daily as needed. For erectile dysfunction.      . Tamsulosin HCl (FLOMAX) 0.4 MG CAPS Take 0.4 mg by mouth daily.      Marland Kitchen terbinafine (LAMISIL) 250 MG tablet Take 250 mg by mouth daily.         No Known Allergies   Past Medical History  Diagnosis Date  . Hypertension     History reviewed. No pertinent past surgical history.  History reviewed. No pertinent family history.  Social History:  reports that he has never smoked. He does not have any smokeless tobacco history on file. He reports that he does not drink alcohol or use illicit drugs.   Review of Systems: Constitutional:  denies fever, chills, diaphoresis, appetite change and fatigue.  HEENT: denies photophobia, eye pain, redness, hearing loss, ear pain,  congestion, sore throat, rhinorrhea, sneezing, neck pain, neck stiffness and tinnitus.  Respiratory: admits to SOB, DOE, cough, chest tightness, and wheezing.  Cardiovascular: admits to chest pain, palpitations and leg swelling.  Right toe swolling  Gastrointestinal: denies nausea, vomiting, abdominal pain, diarrhea, constipation, blood in stool.  Genitourinary: admits to  frequency,   Musculoskeletal: admits to   back pain,   Skin: denies pallor, rash and wound.  Neurological: denies dizziness, seizures, syncope, weakness, light-headedness, numbness and headaches.   Hematological: denies adenopathy, easy bruising, personal or family bleeding history.  Psychiatric/ Behavioral: denies suicidal ideation, mood changes, confusion, nervousness, sleep disturbance and agitation.    Physical Exam: BP 175/115  Pulse 87  Temp 98.1 F (36.7 C) (Oral)  Resp 19  Ht 6\' 1"  (1.854 m)  Wt 195 lb (88.451  kg)  BMI 25.73 kg/m2  SpO2 99%  General: Vital signs reviewed and noted. Well-developed, well-nourished, in no acute distress; alert, appropriate and cooperative throughout examination.  Head: Normocephalic, atraumatic, sclera anicteric, mucus membranes are moist  Neck: Supple. Negative for carotid bruits. JVD not elevated.  Lungs:  Clear bilaterally to auscultation without wheezes, rales, or rhonchi. Breathing is normal.  Heart: RRR with S1 S2. No murmurs,   Abdomen:  Soft, non-tender, non-distended with normoactive bowel sounds. No hepatomegaly. No rebound/guarding. No obvious abdominal masses  MSK: Strength and the appear normal for age.  Extremities: No clubbing or cyanosis. No edema.  Distal pedal pulses are 2+ and equal bilaterally.  Neurologic: Alert and oriented X 3. Moves all extremities spontaneously.  Psych: Responds to questions appropriately with a normal affect.    Lab results: Basic Metabolic Panel:  Lab 04/07/12 1610  NA 141  K 4.4  CL 102  CO2 27  GLUCOSE 101*  BUN 17  CREATININE 0.90  CALCIUM 9.6  MG 2.2  PHOS --    Liver Function Tests: No results found for this basename: AST:3,ALT:3,ALKPHOS:3,BILITOT:3,PROT:3,ALBUMIN:3 in the last 168 hours No results found for this basename: LIPASE:3,AMYLASE:3 in the last 168 hours No results found for this basename: AMMONIA:3 in the last 168 hours  CBC:  Lab 04/07/12 1145  WBC 12.4*  NEUTROABS 10.7*  HGB 14.8  HCT 44.8  MCV 76.2*  PLT 239    Cardiac Enzymes:  Lab 04/07/12 1554 04/07/12 1145  CKTOTAL -- --  CKMB -- --  CKMBINDEX -- --  TROPONINI 3.28* <0.30    BNP: No components found with this basename: POCBNP:3  CBG: No results found for this basename: GLUCAP:5 in the last 168 hours  Coagulation Studies: No results found for this basename: LABPROT:3,INR:3 in the last 72 hours   Other results:  EKG NSR.  ST/T wave abn worrisome for anterior lateral ischemia.  PVCs   Imaging: Dg Chest 2  View  04/07/2012  *RADIOLOGY REPORT*  Clinical Data: Chest pain.  Previous myocardial infarction.  CHEST - 2 VIEW  Comparison: The same day  Findings: Heart size is normal.  There is calcification and unfolding of the aorta.  Lungs are clear except for mild scarring. The vascularity is normal.  No effusions.  No bony abnormalities.  IMPRESSION: Mild pulmonary scarring.  No active disease.   Original Report Authenticated By: Paulina Fusi, M.D.    Dg Chest Port 1 View  04/07/2012  *RADIOLOGY REPORT*  Clinical Data: Chest pain with numbness and tingling.  PORTABLE CHEST - 1 VIEW  Comparison: 01/02/2009  Findings: Numerous leads and wires project over the chest.  Left costophrenic angle is minimally  excluded.  Mild cardiomegaly. No pneumothorax.  No definite pleural fluid.  Suspect underlying hyperinflation.  Vague left suprahilar/medial upper lobe increased density is suspected.  The right lung is clear.  IMPRESSION: Vague increased density in the medial left upper lobe. Although this could be artifactual on this portable film, asymmetric pulmonary edema or early airspace disease cannot be excluded. Consider further evaluation with PA and lateral radiographs.   Original Report Authenticated By: Jeronimo Greaves, M.D.        Assessment & Plan:   NSTEMI (non-ST elevated myocardial infarction) (04/07/2012) His symptoms started as a chest discomfort / indigestion sensation.  He later noted that his HR was fast.  His ECG is worrisome for ant. Lateral ischemia.  He is pain free now.  Agree with heparin and NTG drip.  Plan cath in the AM.  HYPERLIPIDEMIA (11/11/2008)  HYPERTENSION (11/11/2008) His BP is still elevated.  He stopped all of his medications several months ago.  He will likely need additional meds ( HCTZ, ? norvasc )  GERD (11/11/2008)  Paroxysmal SVT (supraventricular tachycardia) (04/07/2012) Continue beta blocker.     DVT PPX -    Alvia Grove., MD, [facility] 04/07/2012, 5:47  PM

## 2012-04-07 NOTE — ED Notes (Signed)
Pt currently in sinus rhythm, 70-85bpm

## 2012-04-07 NOTE — ED Notes (Signed)
Pt transported to xray 

## 2012-04-08 ENCOUNTER — Encounter (HOSPITAL_COMMUNITY)
Admission: EM | Disposition: A | Discharge status: Home / Self Care | Payer: Self-pay | Source: Home / Self Care | Attending: Internal Medicine

## 2012-04-08 ENCOUNTER — Encounter (HOSPITAL_COMMUNITY): Payer: Self-pay | Admitting: Internal Medicine

## 2012-04-08 DIAGNOSIS — I059 Rheumatic mitral valve disease, unspecified: Secondary | ICD-10-CM

## 2012-04-08 DIAGNOSIS — F419 Anxiety disorder, unspecified: Secondary | ICD-10-CM | POA: Diagnosis present

## 2012-04-08 DIAGNOSIS — E876 Hypokalemia: Secondary | ICD-10-CM

## 2012-04-08 DIAGNOSIS — I471 Supraventricular tachycardia: Secondary | ICD-10-CM

## 2012-04-08 DIAGNOSIS — D72829 Elevated white blood cell count, unspecified: Secondary | ICD-10-CM | POA: Diagnosis present

## 2012-04-08 DIAGNOSIS — R079 Chest pain, unspecified: Secondary | ICD-10-CM

## 2012-04-08 HISTORY — PX: LEFT HEART CATHETERIZATION WITH CORONARY ANGIOGRAM: SHX5451

## 2012-04-08 LAB — GLUCOSE, CAPILLARY: Glucose-Capillary: 114 mg/dL — ABNORMAL HIGH (ref 70–99)

## 2012-04-08 LAB — LIPID PANEL
Cholesterol: 178 mg/dL (ref 0–200)
LDL Cholesterol: 96 mg/dL (ref 0–99)
Total CHOL/HDL Ratio: 2.5 RATIO
VLDL: 11 mg/dL (ref 0–40)

## 2012-04-08 LAB — BASIC METABOLIC PANEL
CO2: 25 mEq/L (ref 19–32)
Calcium: 8.8 mg/dL (ref 8.4–10.5)
Creatinine, Ser: 0.95 mg/dL (ref 0.50–1.35)
GFR calc non Af Amer: 89 mL/min — ABNORMAL LOW (ref >90)
Glucose, Bld: 106 mg/dL — ABNORMAL HIGH (ref 70–99)
Sodium: 139 mEq/L (ref 135–145)

## 2012-04-08 LAB — CBC
MCH: 24.8 pg — ABNORMAL LOW (ref 26.0–34.0)
MCHC: 33.4 g/dL (ref 30.0–36.0)
MCV: 74.1 fL — ABNORMAL LOW (ref 78.0–100.0)
Platelets: 230 10*3/uL (ref 150–400)

## 2012-04-08 LAB — POCT ACTIVATED CLOTTING TIME: Activated Clotting Time: 185 seconds

## 2012-04-08 LAB — TSH: TSH: 0.877 u[IU]/mL (ref 0.350–4.500)

## 2012-04-08 LAB — HEMOGLOBIN A1C: Mean Plasma Glucose: 126 mg/dL — ABNORMAL HIGH (ref <117)

## 2012-04-08 LAB — TROPONIN I: Troponin I: 4.17 ng/mL (ref <0.30)

## 2012-04-08 SURGERY — LEFT HEART CATHETERIZATION WITH CORONARY ANGIOGRAM
Anesthesia: LOCAL

## 2012-04-08 MED ORDER — NITROGLYCERIN 0.2 MG/ML ON CALL CATH LAB
INTRAVENOUS | Status: AC
  Filled 2012-04-08: qty 1

## 2012-04-08 MED ORDER — POTASSIUM CHLORIDE CRYS ER 20 MEQ PO TBCR
EXTENDED_RELEASE_TABLET | ORAL | Status: AC
  Filled 2012-04-08: qty 2

## 2012-04-08 MED ORDER — INSULIN ASPART 100 UNIT/ML ~~LOC~~ SOLN: 0.0000 [IU] | Freq: Three times a day (TID) | SUBCUTANEOUS | Status: DC

## 2012-04-08 MED ORDER — ALUM & MAG HYDROXIDE-SIMETH 200-200-20 MG/5ML PO SUSP
30.0000 mL | ORAL | Status: DC | PRN
  Administered 2012-04-08: 20:00:00 30 mL via ORAL
  Filled 2012-04-08: qty 30

## 2012-04-08 MED ORDER — POTASSIUM CHLORIDE CRYS ER 20 MEQ PO TBCR
40.0000 meq | EXTENDED_RELEASE_TABLET | Freq: Once | ORAL | Status: AC
  Administered 2012-04-08: 40 meq via ORAL

## 2012-04-08 MED ORDER — MORPHINE SULFATE 2 MG/ML IJ SOLN: 2.0000 mg | INTRAMUSCULAR | Status: DC | PRN

## 2012-04-08 MED ORDER — HEPARIN (PORCINE) IN NACL 2-0.9 UNIT/ML-% IJ SOLN
INTRAMUSCULAR | Status: AC
  Filled 2012-04-08: qty 1500

## 2012-04-08 MED ORDER — MIDAZOLAM HCL 2 MG/2ML IJ SOLN
INTRAMUSCULAR | Status: AC
  Filled 2012-04-08: qty 2

## 2012-04-08 MED ORDER — AMIODARONE LOAD VIA INFUSION
150.0000 mg | Freq: Once | INTRAVENOUS | Status: AC
  Administered 2012-04-08: 150 mg via INTRAVENOUS
  Filled 2012-04-08: qty 83.34

## 2012-04-08 MED ORDER — FENTANYL CITRATE 0.05 MG/ML IJ SOLN
INTRAMUSCULAR | Status: AC
  Filled 2012-04-08: qty 2

## 2012-04-08 MED ORDER — VERAPAMIL HCL 2.5 MG/ML IV SOLN
INTRAVENOUS | Status: AC
  Filled 2012-04-08: qty 2

## 2012-04-08 MED ORDER — AMIODARONE HCL IN DEXTROSE 360-4.14 MG/200ML-% IV SOLN
INTRAVENOUS | Status: AC
  Filled 2012-04-08: qty 200

## 2012-04-08 MED ORDER — HEPARIN SODIUM (PORCINE) 1000 UNIT/ML IJ SOLN
INTRAMUSCULAR | Status: AC
  Filled 2012-04-08: qty 1

## 2012-04-08 MED ORDER — METOPROLOL TARTRATE 50 MG PO TABS
50.0000 mg | ORAL_TABLET | Freq: Two times a day (BID) | ORAL | Status: DC
  Administered 2012-04-08: 50 mg via ORAL
  Filled 2012-04-08 (×3): qty 1

## 2012-04-08 MED ORDER — AMIODARONE HCL IN DEXTROSE 360-4.14 MG/200ML-% IV SOLN
30.0000 mg/h | INTRAVENOUS | Status: DC
  Filled 2012-04-08: qty 200

## 2012-04-08 MED ORDER — AMIODARONE HCL IN DEXTROSE 360-4.14 MG/200ML-% IV SOLN
60.0000 mg/h | INTRAVENOUS | Status: AC
  Administered 2012-04-08 (×2): 60 mg/h via INTRAVENOUS
  Filled 2012-04-08 (×2): qty 200

## 2012-04-08 MED ORDER — ALPRAZOLAM 0.25 MG PO TABS
0.5000 mg | ORAL_TABLET | Freq: Every evening | ORAL | Status: DC | PRN
  Administered 2012-04-08: 23:00:00 0.5 mg via ORAL
  Filled 2012-04-08 (×2): qty 1

## 2012-04-08 MED ORDER — HYDRALAZINE HCL 20 MG/ML IJ SOLN
10.0000 mg | INTRAMUSCULAR | Status: DC | PRN
  Administered 2012-04-08: 22:00:00 10 mg via INTRAVENOUS
  Filled 2012-04-08 (×3): qty 0.5

## 2012-04-08 MED ORDER — INSULIN ASPART 100 UNIT/ML ~~LOC~~ SOLN: 0.0000 [IU] | Freq: Every day | SUBCUTANEOUS | Status: DC

## 2012-04-08 MED ORDER — LIDOCAINE HCL (PF) 1 % IJ SOLN
INTRAMUSCULAR | Status: AC
  Filled 2012-04-08: qty 30

## 2012-04-08 MED ORDER — SODIUM CHLORIDE 0.9 % IV SOLN: INTRAVENOUS | Status: AC

## 2012-04-08 NOTE — CV Procedure (Signed)
   Cardiac Catheterization Operative Report  Aakash Hollomon [facility] [id number] 11/21/201310:03 AM [name], MD  Procedure Performed:  1. Left Heart Catheterization 2. Selective Coronary Angiography 3. Left ventricular angiogram  Operator: Verne Carrow, MD  Indication: 61 yo male with history of SVT admitted with SVT and positive troponin in setting of elevated HR, chest pain.                                     Procedure Details: The risks, benefits, complications, treatment options, and expected outcomes were discussed with the patient. The patient and/or family concurred with the proposed plan, giving informed consent. The patient was brought to the cath lab after IV hydration was begun and oral premedication was given. The patient was further sedated with Versed and Fentanyl. An Allen's test was performed on the right wrist and was positive. The right wrist was prepped and draped in a sterile fashion. 1% lidocaine was used for local aneshesia. A 5 French sheath was placed in the right radial artery. Due to tortuosity in his right sublavian, I was unable to torque the catheters. I elected to move to the right groino. The right groin was prepped and draped in the usual manner. Using the modified Seldinger access technique, a 5 French sheath was placed in the right femoral artery. Standard diagnostic catheters were used to perform selective coronary angiography. A pigtail catheter was used to perform a left ventricular angiogram.  There were no immediate complications. The patient was taken to the recovery area in stable condition.   Hemodynamic Findings: Central aortic pressure: 117/80 Left ventricular pressure: 134/12/26  Angiographic Findings:  Left main: No evidence of disease.   Left Anterior Descending Artery: Large caliber vessel that courses to the apex. There are mild luminal irregularities in the mid vessel. The diagonal branch is moderate sized with 10% proximal  stenosis.   Circumflex Artery: Large caliber vessel with a large obtuse marginal branch. No obstructive disease noted.   Right Coronary Artery: Large, dominant vessel with no disease noted.   Left Ventricular Angiogram: LVEF 50% with hypokinesis of the anterior and inferior bases.  Impression: 1. Mild non-obstructive CAD 2. Segmental wall motion abnormality of the LV 3. Elevated troponin likely related to demand ischemia in setting of SVT with rapid ventricular rates.   Recommendations: Will ask EP to see to discuss possible SVT ablation and further therapy for SVT.       Complications:  None. The patient tolerated the procedure well.

## 2012-04-08 NOTE — Progress Notes (Signed)
ANTICOAGULATION CONSULT NOTE   Pharmacy Consult for heparin Indication: chest pain/ACS  No Known Allergies  Patient Measurements: Height: 6\' 1"  (185.4 cm) Weight: 195 lb (88.451 kg) IBW/kg (Calculated) : 79.9  Heparin Dosing Weight: 88 kg  Vital Signs: Temp: 98.9 F (37.2 C) (11/21 0334) Temp src: Oral (11/21 0334) BP: 140/87 mmHg (11/21 0334) Pulse Rate: 77  (11/21 0334)  Labs:  Basename 04/08/12 0148 04/07/12 2006 04/07/12 1748 04/07/12 1554 04/07/12 1145  HGB 12.4* -- -- -- 14.8  HCT 37.1* -- -- -- 44.8  PLT 230 -- -- -- 239  APTT -- -- -- -- --  LABPROT -- 14.2 -- -- --  INR -- 1.11 -- -- --  HEPARINUNFRC 0.17* -- -- -- --  CREATININE 0.95 -- -- -- 0.90  CKTOTAL -- -- 842* -- --  CKMB -- -- 26.5* -- --  TROPONINI -- -- -- 3.28* <0.30    Estimated Creatinine Clearance: 93.5 ml/min (by C-G formula based on Cr of 0.95).  Assessment:  61 year old man with chest pain for heparin. Goal of Therapy:  Heparin level 0.3-0.7 units/ml Monitor platelets by anticoagulation protocol: Yes   Plan:  Increase Heparin 1550 units/hr F/U after cath  Eddie Candle 04/08/2012,4:04 AM

## 2012-04-08 NOTE — Care Management Note (Signed)
    Page 1 of 1   04/08/2012     8:51:48 AM   CARE MANAGEMENT NOTE 04/08/2012  Patient:  Cody Brewer, Cody Brewer   Account Number:  [id number]  Date Initiated:  04/08/2012  Documentation initiated by:  Junius Creamer  Subjective/Objective Assessment:   adm w mi     Action/Plan:   lives w wife, pcp dr Gildardo Cranker   Anticipated DC Date:     Anticipated DC Plan:        DC Planning Services  CM consult      Choice offered to / List presented to:             Status of service:   Medicare Important Message given?   (If response is "NO", the following Medicare IM given date fields will be blank) Date Medicare IM given:   Date Additional Medicare IM given:    Discharge Disposition:    Per UR Regulation:  Reviewed for med. necessity/level of care/duration of stay  If discussed at Long Length of Stay Meetings, dates discussed:    Comments:  11/21 8:1a debbie Achsah Mcquade rn,bsn [phone]

## 2012-04-08 NOTE — Progress Notes (Addendum)
Called to see patient for continued CP. 3/10 burning, has had few runs of SVT with up to 8 beats NSVT at times. On 30 mcg/min of NTG. Given 2mg  IV morphine with reduction of pain to 1/10. Discussed with Dr. Eden Emms who recommended IV amiodarone for his arrhythmias.   Additionally:  - the patient's Hgb has decreased. D/w Dr. Eden Emms who feels ok proceeding with cath. Will need to follow UA/CBC (abnormal UA w Hgb may be from physiologic stress of arrhythmia but will need to trend) - will give KCl now - of note, the patient has multiple cutaneous nodules and freckling that he says in the past were supposed to be biopsied but he never had this done. His grandmother had a similar condition. Would recommend IM w/u of his skin abnormalities - he almost has the appearance of neurofibromatosis but would defer to IM. - will move up cath sooner this AM  Maryfer Tauzin PA-C

## 2012-04-08 NOTE — Interval H&P Note (Signed)
History and Physical Interval Note:  04/08/2012 9:13 AM  Knute Neu  has presented today for cardiac cath  with the diagnosis of Chest pain/NSTEMI.  The various methods of treatment have been discussed with the patient and family. After consideration of risks, benefits and other options for treatment, the patient has consented to  Procedure(s) (LRB) with comments: LEFT HEART CATHETERIZATION WITH CORONARY ANGIOGRAM (N/A) as a surgical intervention .  The patient's history has been reviewed, patient examined, no change in status, stable for surgery.  I have reviewed the patient's chart and labs.  Questions were answered to the patient's satisfaction.     MCALHANY,CHRISTOPHER

## 2012-04-08 NOTE — Progress Notes (Signed)
  Echocardiogram 2D Echocardiogram has been performed.  Cathie Beams 04/08/2012, 3:13 PM

## 2012-04-08 NOTE — H&P (View-Only) (Signed)
Patient ID: Cody Brewer, male   DOB: 03/03/1951, 61 y.o.   MRN: [id number]    Subjective:  Denies SSCP, palpitations or Dyspnea Indicates that he gets palpitations all the time  Previously seen by Dr Crenshaw in 2010/2011 Cath in 2010 with 40% LAD   Objective:  Filed Vitals:   04/08/12 0500 04/08/12 0600 04/08/12 0700 04/08/12 0800  BP: 120/72 134/88 136/87 130/90  Pulse: 71 71 71 71  Temp:      TempSrc:      Resp: 5 16 16 17  Height:      Weight:      SpO2:  97% 96% 97%    Intake/Output from previous day:  Intake/Output Summary (Last 24 hours) at 04/08/12 0829 Last data filed at 04/08/12 0600  Gross per 24 hour  Intake 1141.64 ml  Output    325 ml  Net 816.64 ml    Physical Exam: Affect appropriate Healthy:  appears stated age HEENT: normal Neck supple with no adenopathy JVP normal no bruits no thyromegaly Lungs clear with no wheezing and good diaphragmatic motion Heart:  S1/S2 no murmur, no rub, gallop or click PMI normal Abdomen: benighn, BS positve, no tenderness, no AAA no bruit.  No HSM or HJR Distal pulses intact with no bruits No edema Neuro non-focal Skin warm and dry No muscular weakness   Lab Results: Basic Metabolic Panel:  Basename 04/08/12 0148 04/07/12 1145  NA 139 141  K 3.4* 4.4  CL 104 102  CO2 25 27  GLUCOSE 106* 101*  BUN 15 17  CREATININE 0.95 0.90  CALCIUM 8.8 9.6  MG -- 2.2  PHOS -- --   Liver Function Tests: No results found for this basename: AST:2,ALT:2,ALKPHOS:2,BILITOT:2,PROT:2,ALBUMIN:2 in the last 72 hours No results found for this basename: LIPASE:2,AMYLASE:2 in the last 72 hours CBC:  Basename 04/08/12 0148 04/07/12 1145  WBC 9.9 12.4*  NEUTROABS -- 10.7*  HGB 12.4* 14.8  HCT 37.1* 44.8  MCV 74.1* 76.2*  PLT 230 239   Cardiac Enzymes:  Basename 04/07/12 1748 04/07/12 1554 04/07/12 1145  CKTOTAL 842* -- --  CKMB 26.5* -- --  CKMBINDEX -- -- --  TROPONINI -- 3.28* <0.30   BNP: No components found  with this basename: POCBNP:3 D-Dimer: No results found for this basename: DDIMER:2 in the last 72 hours Hemoglobin A1C:  Basename 04/07/12 2006  HGBA1C 6.0*   Fasting Lipid Panel:  Basename 04/08/12 0148  CHOL 178  HDL 71  LDLCALC 96  TRIG 56  CHOLHDL 2.5  LDLDIRECT --   Thyroid Function Tests:  Basename 04/07/12 2006  TSH 0.877  T4TOTAL --  T3FREE --  THYROIDAB --   Anemia Panel: No results found for this basename: VITAMINB12,FOLATE,FERRITIN,TIBC,IRON,RETICCTPCT in the last 72 hours  Imaging: Dg Chest 2 View  04/07/2012  *RADIOLOGY REPORT*  Clinical Data: Chest pain.  Previous myocardial infarction.  CHEST - 2 VIEW  Comparison: The same day  Findings: Heart size is normal.  There is calcification and unfolding of the aorta.  Lungs are clear except for mild scarring. The vascularity is normal.  No effusions.  No bony abnormalities.  IMPRESSION: Mild pulmonary scarring.  No active disease.   Original Report Authenticated By: Mark Shogry, M.D.    Dg Chest Port 1 View  04/07/2012  *RADIOLOGY REPORT*  Clinical Data: Chest pain with numbness and tingling.  PORTABLE CHEST - 1 VIEW  Comparison: 01/02/2009  Findings: Numerous leads and wires project over the chest.  Left costophrenic   angle is minimally excluded.  Mild cardiomegaly. No pneumothorax.  No definite pleural fluid.  Suspect underlying hyperinflation.  Vague left suprahilar/medial upper lobe increased density is suspected.  The right lung is clear.  IMPRESSION: Vague increased density in the medial left upper lobe. Although this could be artifactual on this portable film, asymmetric pulmonary edema or early airspace disease cannot be excluded. Consider further evaluation with PA and lateral radiographs.   Original Report Authenticated By: Kyle Talbot, M.D.     Cardiac Studies:  ECG:  SVT rapid rate after breaking ECG with marked lateral T wave inversions   Telemetry: NSR no SVT 04/08/2012   Echo:   Medications:      . sodium chloride   Intravenous STAT  . [COMPLETED] adenosine (ADENOCARD) IV  6 mg Intravenous Once  . [COMPLETED] aspirin  162 mg Oral Once  . aspirin  162 mg Oral Daily  . [COMPLETED] aspirin  324 mg Oral Pre-Cath  . atorvastatin  10 mg Oral q1800  . [COMPLETED] diltiazem  30 mg Oral Once  . [COMPLETED] labetalol  100 mg Oral Once  . metoprolol tartrate  25 mg Oral BID  . pantoprazole  40 mg Oral Daily  . [COMPLETED] sodium chloride  1,000 mL Intravenous Once  . sodium chloride  3 mL Intravenous Q12H  . sodium chloride  3 mL Intravenous Q12H  . [DISCONTINUED] heparin  4,000 Units Intravenous Once       . sodium chloride 1 mL/kg/hr (04/08/12 0332)  . heparin 1,550 Units/hr (04/08/12 0407)  . nitroGLYCERIN 30 mcg/min (04/08/12 0747)    Assessment/Plan:  SVT:  Continue beta blocker.  Will have EP see post cath since his symptoms are frequent Chest Pain:  Signifcant bump in CPK with ECG chagnes.  Cath latter today with [facility].  May be demand ischemia with such  Rapid rate in SVT  40% LAD in 2010 Chol:  Continue statin  Elora Wolter 04/08/2012, 8:29 AM     

## 2012-04-08 NOTE — Progress Notes (Signed)
Patient ID: Cody Brewer, male   DOB: December 10, 1950, 61 y.o.   MRN: [id number]    Subjective:  Denies SSCP, palpitations or Dyspnea Indicates that he gets palpitations all the time  Previously seen by Dr Jens Som in 2010/2011 Cath in 2010 with 40% LAD   Objective:  Filed Vitals:   04/08/12 0500 04/08/12 0600 04/08/12 0700 04/08/12 0800  BP: 120/72 134/88 136/87 130/90  Pulse: 71 71 71 71  Temp:      TempSrc:      Resp: 5 16 16 17   Height:      Weight:      SpO2:  97% 96% 97%    Intake/Output from previous day:  Intake/Output Summary (Last 24 hours) at 04/08/12 0829 Last data filed at 04/08/12 0600  Gross per 24 hour  Intake 1141.64 ml  Output    325 ml  Net 816.64 ml    Physical Exam: Affect appropriate Healthy:  appears stated age HEENT: normal Neck supple with no adenopathy JVP normal no bruits no thyromegaly Lungs clear with no wheezing and good diaphragmatic motion Heart:  S1/S2 no murmur, no rub, gallop or click PMI normal Abdomen: benighn, BS positve, no tenderness, no AAA no bruit.  No HSM or HJR Distal pulses intact with no bruits No edema Neuro non-focal Skin warm and dry No muscular weakness   Lab Results: Basic Metabolic Panel:  Basename 04/08/12 0148 04/07/12 1145  NA 139 141  K 3.4* 4.4  CL 104 102  CO2 25 27  GLUCOSE 106* 101*  BUN 15 17  CREATININE 0.95 0.90  CALCIUM 8.8 9.6  MG -- 2.2  PHOS -- --   Liver Function Tests: No results found for this basename: AST:2,ALT:2,ALKPHOS:2,BILITOT:2,PROT:2,ALBUMIN:2 in the last 72 hours No results found for this basename: LIPASE:2,AMYLASE:2 in the last 72 hours CBC:  Basename 04/08/12 0148 04/07/12 1145  WBC 9.9 12.4*  NEUTROABS -- 10.7*  HGB 12.4* 14.8  HCT 37.1* 44.8  MCV 74.1* 76.2*  PLT 230 239   Cardiac Enzymes:  Basename 04/07/12 1748 04/07/12 1554 04/07/12 1145  CKTOTAL 842* -- --  CKMB 26.5* -- --  CKMBINDEX -- -- --  TROPONINI -- 3.28* <0.30   BNP: No components found  with this basename: POCBNP:3 D-Dimer: No results found for this basename: DDIMER:2 in the last 72 hours Hemoglobin A1C:  Basename 04/07/12 2006  HGBA1C 6.0*   Fasting Lipid Panel:  Basename 04/08/12 0148  CHOL 178  HDL 71  LDLCALC 96  TRIG 56  CHOLHDL 2.5  LDLDIRECT --   Thyroid Function Tests:  Basename 04/07/12 2006  TSH 0.877  T4TOTAL --  T3FREE --  THYROIDAB --   Anemia Panel: No results found for this basename: VITAMINB12,FOLATE,FERRITIN,TIBC,IRON,RETICCTPCT in the last 72 hours  Imaging: Dg Chest 2 View  04/07/2012  *RADIOLOGY REPORT*  Clinical Data: Chest pain.  Previous myocardial infarction.  CHEST - 2 VIEW  Comparison: The same day  Findings: Heart size is normal.  There is calcification and unfolding of the aorta.  Lungs are clear except for mild scarring. The vascularity is normal.  No effusions.  No bony abnormalities.  IMPRESSION: Mild pulmonary scarring.  No active disease.   Original Report Authenticated By: Paulina Fusi, M.D.    Dg Chest Port 1 View  04/07/2012  *RADIOLOGY REPORT*  Clinical Data: Chest pain with numbness and tingling.  PORTABLE CHEST - 1 VIEW  Comparison: 01/02/2009  Findings: Numerous leads and wires project over the chest.  Left costophrenic  angle is minimally excluded.  Mild cardiomegaly. No pneumothorax.  No definite pleural fluid.  Suspect underlying hyperinflation.  Vague left suprahilar/medial upper lobe increased density is suspected.  The right lung is clear.  IMPRESSION: Vague increased density in the medial left upper lobe. Although this could be artifactual on this portable film, asymmetric pulmonary edema or early airspace disease cannot be excluded. Consider further evaluation with PA and lateral radiographs.   Original Report Authenticated By: Jeronimo Greaves, M.D.     Cardiac Studies:  ECG:  SVT rapid rate after breaking ECG with marked lateral T wave inversions   Telemetry: NSR no SVT 04/08/2012   Echo:   Medications:      . sodium chloride   Intravenous STAT  . [COMPLETED] adenosine (ADENOCARD) IV  6 mg Intravenous Once  . [COMPLETED] aspirin  162 mg Oral Once  . aspirin  162 mg Oral Daily  . [COMPLETED] aspirin  324 mg Oral Pre-Cath  . atorvastatin  10 mg Oral q1800  . [COMPLETED] diltiazem  30 mg Oral Once  . [COMPLETED] labetalol  100 mg Oral Once  . metoprolol tartrate  25 mg Oral BID  . pantoprazole  40 mg Oral Daily  . [COMPLETED] sodium chloride  1,000 mL Intravenous Once  . sodium chloride  3 mL Intravenous Q12H  . sodium chloride  3 mL Intravenous Q12H  . [DISCONTINUED] heparin  4,000 Units Intravenous Once       . sodium chloride 1 mL/kg/hr (04/08/12 0332)  . heparin 1,550 Units/hr (04/08/12 0407)  . nitroGLYCERIN 30 mcg/min (04/08/12 0747)    Assessment/Plan:  SVT:  Continue beta blocker.  Will have EP see post cath since his symptoms are frequent Chest Pain:  Signifcant bump in CPK with ECG chagnes.  Cath latter today with [facility]  May be demand ischemia with such  Rapid rate in SVT  40% LAD in 2010 Chol:  Continue statin  Charlton Haws 04/08/2012, 8:29 AM

## 2012-04-08 NOTE — Progress Notes (Signed)
[facility] Progress Note [place] TEAM 1 - Stepdown/ICU TEAM   Cody Brewer XLK:[id number] DOB: February 26, 1951 DOA: 04/07/2012 PCP: Cody Floro, MD  Brief narrative: 61 y/o man with a h/o CAD with last cath in August 2010 that revealed a 50% second diagonal and a 70% mid to distal LAD due to spasm and compression. It was felt that medical therapy was indicated. He has had intermittent CP ever since. On the morning of admission at about 8, he starting having CP again that he described as SS, radiating into his left shoulder with tingling in his fingertips. In the ED he was noted to be in SVT with HRs in the 150-160s. Twice he converted to NSR spontaneously, a third time he converted after given cardizem (adenosine not used). The EDP has asked [place] to admit him for initiation of medications. Afterwards, a troponin has returned positive at 3.28. CP has now returned and EKG shows some ant TWI and mild ST depressions. Cardiology has been consulted.   Assessment/Plan: Principal Problem:  Known CAD/Demand ischemia/CHEST PAIN *Troponin at presentation was 0.30 and currently has increased to 5.61 *Cardiac catheterization completed today shows stable nonobstructive CAD with etiology to chest discomfort and abnormal troponins felt to be demand ischemia according to cardiology *Patient also has elevated CPK levels somewhat disproportionate to the degree of troponin elevation and may have a degree of dehydration-was not on a statin at home *Continue post-cath IV fluids and followup on CPK in the morning  Active Problems:  Paroxysmal SVT (supraventricular tachycardia)/NSVT *Suspected etiology to patient's demand ischemia *Has been started on amiodarone IV by cardiology *Electrophysiology consultation pending for possible ablation. *Discuss need to stop offending substances especially caffeine   HYPERTENSION *Blood pressure uncontrolled at presentation and currently moderately  controlled *Started on Lopressor this admission *Left ventricular angiogram reveals LVEF 50% with hypokinesis of anterior and inferior bases felt related to tachycardia related demand ischemia   Leukocytosis *Resolved and felt related to acute stress the margination   Hypokalemia *Oral replete and follow up electrolytes in a.m.   Anxiety *Patient describes history of long-term anxiety as well as being "high strung" *Also describes issues of frequent nocturnal awakenings and describes as jerking awake the patient may have underlying sleep disorder that may benefit from outpatient polysomnogram testing *Discuss with patient attempted to use nonpharmacological approach to anxiety and insomnia but will provide Xanax while hospitalized   HYPERLIPIDEMIA *Lipid panel shows no abnormality   Chronic Microcytic anemia *Baseline hemoglobin between 12 and 14   GERD *Was not all medications prior to admission  Benign prostatic hypertrophy *Was on Flomax prior to admission as well as Viagra   DVT prophylaxis: Was on heparin precatheterization but this has been discontinued so we'll begin SCD Code Status: Full Family Communication: Spoke with patient and wife Disposition Plan: Remain in step down  Consultants: Cardiology Electrophysiology  Procedures: Left heart cardiac catheterization 04/08/2012 by Dr. Sanjuana Brewer  Antibiotics: None  HPI/Subjective: Patient awake and alert and primarily complaining of issues related to stress and being "high strung" and concerns over ongoing palpitations prior to admission. Currently no chest pain   Objective: Blood pressure 141/93, pulse 68, temperature 97.9 F (36.6 C), temperature source Oral, resp. rate 16, height 6\' 1"  (1.854 m), weight 88.451 kg (195 lb), SpO2 99.00%.  Intake/Output Summary (Last 24 hours) at 04/08/12 1338 Last data filed at 04/08/12 0600  Gross per 24 hour  Intake 1141.64 ml  Output    325 ml  Net 816.64 ml  Exam: General: No acute respiratory distress Lungs: Clear to auscultation bilaterally without wheezes or crackles Cardiovascular: Regular rate and rhythm without murmur gallop or rub normal S1 and S2, IV amiodarone, no peripheral edema Abdomen: Nontender, nondistended, soft, bowel sounds positive, no rebound, no ascites, no appreciable mass Musculoskeletal: No significant cyanosis, clubbing of bilateral lower extremities Neurological: Patient is alert and oriented x3, moves all extremities x4, exam non-focal  Data Reviewed: Basic Metabolic Panel:  Lab 04/08/12 1610 04/07/12 1145  NA 139 141  K 3.4* 4.4  CL 104 102  CO2 25 27  GLUCOSE 106* 101*  BUN 15 17  CREATININE 0.95 0.90  CALCIUM 8.8 9.6  MG -- 2.2  PHOS -- --   Liver Function Tests: No results found for this basename: AST:5,ALT:5,ALKPHOS:5,BILITOT:5,PROT:5,ALBUMIN:5 in the last 168 hours No results found for this basename: LIPASE:5,AMYLASE:5 in the last 168 hours No results found for this basename: AMMONIA:5 in the last 168 hours CBC:  Lab 04/08/12 0148 04/07/12 1145  WBC 9.9 12.4*  NEUTROABS -- 10.7*  HGB 12.4* 14.8  HCT 37.1* 44.8  MCV 74.1* 76.2*  PLT 230 239   Cardiac Enzymes:  Lab 04/08/12 0827 04/07/12 1748 04/07/12 1554 04/07/12 1145  CKTOTAL -- 842* -- --  CKMB -- 26.5* -- --  CKMBINDEX -- -- -- --  TROPONINI 5.61* -- 3.28* <0.30   BNP (last 3 results)  Basename 04/07/12 2006  PROBNP 2216.0*   CBG: No results found for this basename: GLUCAP:5 in the last 168 hours  Recent Results (from the past 240 hour(s))  MRSA PCR SCREENING     Status: Normal   Collection Time   04/07/12  7:14 PM      Component Value Range Status Comment   MRSA by PCR NEGATIVE  NEGATIVE Final      Studies:  Recent x-ray studies have been reviewed in detail by the Attending Physician  Scheduled Meds:  Reviewed in detail by the Attending Physician   Cody Brewer, ANP [facility] Office   [phone] Pager [phone]  On-Call/Text Page:      [name]      password [email or web address]  If 7PM-7AM, please contact night-coverage [email or web address] Password [facility] 04/08/2012, 1:38 PM   LOS: 1 day   I have examined the patient, reviewed the chart and agree with the above note. I have added an insulin sliding scale for elevated A1c.   Cody Cantor, MD [phone]

## 2012-04-08 NOTE — Progress Notes (Signed)
Pt had episode of SVT this am, relieved by coughing.  Pt continues to c/o right sided  And midsternal chest pain.

## 2012-04-08 NOTE — Progress Notes (Signed)
Pt's BP 170/117, [place] PA called, Noralyn Pick PA returned call, new orders received.  Will continue to monitor patient and BP.

## 2012-04-08 NOTE — Consult Note (Addendum)
ELECTROPHYSIOLOGY CONSULT NOTE  Patient ID: Cody Brewer, MRN: [id number], DOB/AGE: Jan 20, 1951 61 y.o. Admit date: 04/07/2012 Date of Consult: 04/08/2012  Primary Physician: [name], MD Primary Cardiologist:BC    Chief Complaint: SVT   2  HPI Cody Brewer is a 61 y.o. male admitted with SVT chest pain and positive troponins. He has had a 10 year history of abrupt onset tachycardia palpitations associated with chest discomfort lightheadedness and shortness of breath. There are frequently with exertion but also at night. They are frog negative. They're diuretic negative. He does not note in association with caffeine or stress. Mostly they're self-limited. Occasionally as in this when they did not. It would recur frequently on its own even with intermittent terminations. Telemetry demonstrates onset with ventricular couplets.  He also has a history of significant hypertension. He had sleep disturbance with restless legs to the point that his wife is asked him to sleep in the other room  Cardiac evaluation includes an echocardiogram demonstrating focal hypertrophy normal systolic function mild MR  Catheterization demonstrated normal left ventricular function and nonobstructive coronary disease and this was undertaken because of peak troponin of 5.6.    Past Medical History  Diagnosis Date  . Hypertension   . SVT (supraventricular tachycardia)   . Sleep disturbance, unspecified       Surgical History: History reviewed. No pertinent past surgical history.   Home Meds: Prior to Admission medications   Medication Sig Start Date End Date Taking? Authorizing Provider  aspirin 81 MG chewable tablet Chew 162 mg by mouth daily.   Yes Historical Provider, MD  sildenafil (VIAGRA) 100 MG tablet Take 100 mg by mouth daily as needed. For erectile dysfunction.   Yes Historical Provider, MD  Tamsulosin HCl (FLOMAX) 0.4 MG CAPS Take 0.4 mg by mouth daily.   Yes Historical  Provider, MD  terbinafine (LAMISIL) 250 MG tablet Take 250 mg by mouth daily.   Yes Historical Provider, MD    Inpatient Medications:     . amiodarone (NEXTERONE PREMIX) 360 mg/200 mL dextrose      . [COMPLETED] amiodarone  150 mg Intravenous Once  . aspirin  162 mg Oral Daily  . [COMPLETED] aspirin  324 mg Oral Pre-Cath  . atorvastatin  10 mg Oral q1800  . [COMPLETED] fentaNYL      . [COMPLETED] heparin      . [COMPLETED] heparin      . insulin aspart  0-15 Units Subcutaneous TID WC  . insulin aspart  0-5 Units Subcutaneous QHS  . [COMPLETED] lidocaine      . metoprolol tartrate  25 mg Oral BID  . [COMPLETED] midazolam      . [COMPLETED] nitroGLYCERIN      . pantoprazole  40 mg Oral Daily  . potassium chloride SA      . [COMPLETED] potassium chloride  40 mEq Oral Once  . sodium chloride  3 mL Intravenous Q12H  . [COMPLETED] verapamil      . [DISCONTINUED] sodium chloride   Intravenous STAT  . [DISCONTINUED] sodium chloride  3 mL Intravenous Q12H    Allergies: No Known Allergies  History   Social History  . Marital Status: Married    Spouse Name: N/A    Number of Children: N/A  . Years of Education: N/A   Occupational History  . Not on file.   Social History Main Topics  . Smoking status: Never Smoker   . Smokeless tobacco: Not on file  . Alcohol Use:  No  . Drug Use: No  . Sexually Active:    Other Topics Concern  . Not on file   Social History Narrative  . No narrative on file     History reviewed. No pertinent family history.   ROS:  Please see the history of present illness.   Negative except night sweats, weight loss anixiety depression and tender lymph nodes  All other systems reviewed and negative.    Physical Exam: Blood pressure 134/90, pulse 88, temperature 98.2 F (36.8 C), temperature source Oral, resp. rate 19, height 6\' 1"  (1.854 m), weight 195 lb (88.451 kg), SpO2 99.00%. General: Well developed, well nourished male in no acute  distress. Head: Normocephalic, atraumatic, sclera non-icteric, no xanthomas, nares are without discharge. Lymph Nodes:  Small LN iin cerical and scm chains Back: without scoliosis/kyphosis, no CVA tendersness Neck: Negative for carotid bruits. JVD not elevated. Lungs: Clear bilaterally to auscultation without wheezes, rales, or rhonchi. Breathing is unlabored. Heart: RRR with S1 S2. No  murmur , rubs, or gallops appreciated. Abdomen: Soft, non-tender, non-distended with normoactive bowel sounds. No hepatomegaly. No rebound/guarding. No obvious abdominal masses. Msk:  Strength and tone appear normal for age. Extremities: No clubbing or cyanosis. No  edema.  Distal pedal pulses are 2+ and equal bilaterally. Skin: Warm and Dry Neuro: Alert and oriented X 3. CN III-XII intact Grossly normal sensory and motor function . Psych:  Responds to questions appropriately with a normal affect.      Labs: Cardiac Enzymes  Basename 04/08/12 1235 04/08/12 0827 04/07/12 1748 04/07/12 1554 04/07/12 1145  CKTOTAL -- -- 842* -- --  CKMB -- -- 26.5* -- --  TROPONINI 4.17* 5.61* -- 3.28* <0.30   CBC Lab Results  Component Value Date   WBC 9.9 04/08/2012   HGB 12.4* 04/08/2012   HCT 37.1* 04/08/2012   MCV 74.1* 04/08/2012   PLT 230 04/08/2012   PROTIME:  Basename 04/07/12 2006  LABPROT 14.2  INR 1.11   Chemistry   Lab 04/08/12 0148  NA 139  K 3.4*  CL 104  CO2 25  BUN 15  CREATININE 0.95  CALCIUM 8.8  PROT --  BILITOT --  ALKPHOS --  ALT --  AST --  GLUCOSE 106*   Lipids Lab Results  Component Value Date   CHOL 178 04/08/2012   HDL 71 04/08/2012   LDLCALC 96 04/08/2012   TRIG 56 04/08/2012   BNP Pro B Natriuretic peptide (BNP)  Date/Time Value Range Status  04/07/2012  8:06 PM 2216.0* 0 - 125 pg/mL Final  12/23/2008  6:36 PM 235.0* 0.0 - 100.0 pg/mL Final   Miscellaneous No results found for this basename: DDIMER    Radiology/Studies:  Dg Chest 2 View  04/07/2012   *RADIOLOGY REPORT*  Clinical Data: Chest pain.  Previous myocardial infarction.  CHEST - 2 VIEW  Comparison: The same day  Findings: Heart size is normal.  There is calcification and unfolding of the aorta.  Lungs are clear except for mild scarring. The vascularity is normal.  No effusions.  No bony abnormalities.  IMPRESSION: Mild pulmonary scarring.  No active disease.   Original Report Authenticated By: Paulina Fusi, M.D.    Dg Chest Port 1 View  04/07/2012  *RADIOLOGY REPORT*  Clinical Data: Chest pain with numbness and tingling.  PORTABLE CHEST - 1 VIEW  Comparison: 01/02/2009  Findings: Numerous leads and wires project over the chest.  Left costophrenic angle is minimally excluded.  Mild cardiomegaly. No pneumothorax.  No definite pleural fluid.  Suspect underlying hyperinflation.  Vague left suprahilar/medial upper lobe increased density is suspected.  The right lung is clear.  IMPRESSION: Vague increased density in the medial left upper lobe. Although this could be artifactual on this portable film, asymmetric pulmonary edema or early airspace disease cannot be excluded. Consider further evaluation with PA and lateral radiographs.   Original Report Authenticated By: Jeronimo Greaves, M.D.     EKG: sinus at 84  .18.12 42  LVH wi repol  SVT  Cl 360 without discernible pwaves  Assessment and Plan:       Patient Active Hospital Problem List:   HYPERTENSION (11/11/2008)    CHEST PAIN (11/11/2008)   Paroxysmal SVT (supraventricular tachycardia) (04/07/2012)  The patient has supraventricular tachycardia which I suspect is mediated be a concealed accessory pathway given the initiation of ventricular couplets. We have discussed treatment options including medical therapy which he needs anyway for his blood pressure versus catheter ablation. We discussed the risks and benefits including but not limited to death stroke MI and heart block requiring pacemaker implantation. He would like to pursue  catheter ablation. He would like to do it as an outpatient however, as he would like to be home for Thanksgiving and not spend anymore time in hospital.  I will have Dr. Ladona Ridgel see him in the morning and anticipate scheduling an outpatient procedure. For nwo will increase beta blocker   Primary service will need to look into weight loss and night sweats  Will check ESR-TSH normla  Sherryl Manges

## 2012-04-08 NOTE — Progress Notes (Signed)
TR BAND REMOVAL  LOCATION:    right radial  DEFLATED PER PROTOCOL:    yes  TIME BAND OFF / DRESSING APPLIED:    1230   SITE UPON ARRIVAL:    Level 0  SITE AFTER BAND REMOVAL:    Level 0  REVERSE ALLEN'S TEST:     positive  CIRCULATION SENSATION AND MOVEMENT:    Within Normal Limits   yes  COMMENTS:   TOLERATED PROCEDURE WELL  

## 2012-04-09 DIAGNOSIS — F411 Generalized anxiety disorder: Secondary | ICD-10-CM

## 2012-04-09 DIAGNOSIS — K219 Gastro-esophageal reflux disease without esophagitis: Secondary | ICD-10-CM

## 2012-04-09 LAB — CBC
Hemoglobin: 13.4 g/dL (ref 13.0–17.0)
MCH: 24.9 pg — ABNORMAL LOW (ref 26.0–34.0)
Platelets: 235 10*3/uL (ref 150–400)
RBC: 5.38 MIL/uL (ref 4.22–5.81)
WBC: 9.4 10*3/uL (ref 4.0–10.5)

## 2012-04-09 LAB — GLUCOSE, CAPILLARY: Glucose-Capillary: 89 mg/dL (ref 70–99)

## 2012-04-09 LAB — CK TOTAL AND CKMB (NOT AT ARMC)
CK, MB: 4.8 ng/mL — ABNORMAL HIGH (ref 0.3–4.0)
Relative Index: 1.6 (ref 0.0–2.5)
Total CK: 292 U/L — ABNORMAL HIGH (ref 7–232)

## 2012-04-09 MED ORDER — METOPROLOL TARTRATE 50 MG PO TABS
50.0000 mg | ORAL_TABLET | ORAL | Status: AC
  Administered 2012-04-09: 50 mg via ORAL
  Filled 2012-04-09: qty 1

## 2012-04-09 MED ORDER — METOPROLOL TARTRATE 100 MG PO TABS: 100.0000 mg | ORAL_TABLET | Freq: Two times a day (BID) | ORAL | Status: DC

## 2012-04-09 MED ORDER — METOPROLOL TARTRATE 100 MG PO TABS: ORAL_TABLET | ORAL | Status: DC

## 2012-04-09 MED ORDER — OFF THE BEAT BOOK
Freq: Once | Status: DC
  Filled 2012-04-09: qty 1

## 2012-04-09 MED ORDER — ATORVASTATIN CALCIUM 10 MG PO TABS: 10.0000 mg | ORAL_TABLET | Freq: Every day | ORAL | Status: DC

## 2012-04-09 MED ORDER — FAMOTIDINE 20 MG PO TABS
20.0000 mg | ORAL_TABLET | Freq: Two times a day (BID) | ORAL | Status: DC
  Administered 2012-04-09: 20 mg via ORAL
  Filled 2012-04-09 (×2): qty 1

## 2012-04-09 MED ORDER — LIVING WELL WITH DIABETES BOOK
Freq: Once | Status: DC
  Filled 2012-04-09: qty 1

## 2012-04-09 MED ORDER — METOPROLOL TARTRATE 100 MG PO TABS
100.0000 mg | ORAL_TABLET | Freq: Two times a day (BID) | ORAL | Status: DC
  Filled 2012-04-09: qty 1

## 2012-04-09 MED ORDER — FAMOTIDINE 20 MG PO TABS: 20.0000 mg | ORAL_TABLET | Freq: Two times a day (BID) | ORAL | Status: DC

## 2012-04-09 NOTE — Discharge Summary (Signed)
DISCHARGE SUMMARY  Cody Brewer  MR#: [id number]  DOB:1950-06-11  Date of Admission: 04/07/2012 Date of Discharge: 04/09/2012  Attending Physician:Raymie Giammarco Butler Denmark, MD  Patient's WUJ:[id number] Hessie Diener, MD  Consults:Treatment Team:  Rounding L[facility]cardiology, MD/Dr. Verne Carrow Electrophysiology cardiologist/Dr. [name]  Pertinent Discharge information: Patient admitted with chest pain and subsequent abnormal cardiac isoenzymes secondary to SVT/nonsustained ventricular tachycardia. Cardiac catheterization revealed stable nonobstructive CAD.  Patient is to undergo ablation procedure by Dr. Ladona Ridgel on 05/06/2012  Patient apparently verbalized to the cardiologist complaints of weight loss and night sweats but denied this to the medicine team when questioned. He apparently is having issues with GERD as well as insomnia. We have started him on Pepcid this admission and recommended nonpharmacological approaches to insomnia but if medication needed we recommended melatonin or Benadryl. He may also have an atypical presentation of a sleep disorder and may benefit from a polysomnogram evaluation if symptoms persist.  Discharge Diagnoses: Principal Problem:  *Demand ischemia secondary to Paroxysmal SVT (supraventricular tachycardia) and NSVT Active Problems:  CAD-cardiac catheterization this admission revealed stable nonobstructive CAD  CHEST PAIN secondary to SVT-  HYPERTENSION (uncontrolled)  Leukocytosis  Hypokalemia  Anxiety  HYPERLIPIDEMIA  Chronic Microcytic anemia  GERD   Radiology: Dg Chest 2 View  04/07/2012  *RADIOLOGY REPORT*  Clinical Data: Chest pain.  Previous myocardial infarction.  CHEST - 2 VIEW  Comparison: The same day  Findings: Heart size is normal.  There is calcification and unfolding of the aorta.  Lungs are clear except for mild scarring. The vascularity is normal.  No effusions.  No bony abnormalities.  IMPRESSION: Mild pulmonary scarring.  No active  disease.   Original Report Authenticated By: Paulina Fusi, M.D.    Dg Chest Port 1 View  04/07/2012  *RADIOLOGY REPORT*  Clinical Data: Chest pain with numbness and tingling.  PORTABLE CHEST - 1 VIEW  Comparison: 01/02/2009  Findings: Numerous leads and wires project over the chest.  Left costophrenic angle is minimally excluded.  Mild cardiomegaly. No pneumothorax.  No definite pleural fluid.  Suspect underlying hyperinflation.  Vague left suprahilar/medial upper lobe increased density is suspected.  The right lung is clear.  IMPRESSION: Vague increased density in the medial left upper lobe. Although this could be artifactual on this portable film, asymmetric pulmonary edema or early airspace disease cannot be excluded. Consider further evaluation with PA and lateral radiographs.   Original Report Authenticated By: Jeronimo Greaves, M.D.     Laboratory: Results for orders placed during the hospital encounter of 04/07/12 (from the past 48 hour(s))  TROPONIN I     Status: Abnormal   Collection Time   04/07/12  3:54 PM      Component Value Range Comment   Troponin I 3.28 (*) <0.30 ng/mL   CK TOTAL AND CKMB     Status: Abnormal   Collection Time   04/07/12  5:48 PM      Component Value Range Comment   Total CK 842 (*) 7 - 232 U/L    CK, MB 26.5 (*) 0.3 - 4.0 ng/mL    Relative Index 3.1 (*) 0.0 - 2.5   MRSA PCR SCREENING     Status: Normal   Collection Time   04/07/12  7:14 PM      Component Value Range Comment   MRSA by PCR NEGATIVE  NEGATIVE   PROTIME-INR     Status: Normal   Collection Time   04/07/12  8:06 PM      Component Value Range  Comment   Prothrombin Time 14.2  11.6 - 15.2 seconds    INR 1.11  0.00 - 1.49   TSH     Status: Normal   Collection Time   04/07/12  8:06 PM      Component Value Range Comment   TSH 0.877  0.350 - 4.500 uIU/mL   PRO B NATRIURETIC PEPTIDE     Status: Abnormal   Collection Time   04/07/12  8:06 PM      Component Value Range Comment   Pro B Natriuretic  peptide (BNP) 2216.0 (*) 0 - 125 pg/mL   HEMOGLOBIN A1C     Status: Abnormal   Collection Time   04/07/12  8:06 PM      Component Value Range Comment   Hemoglobin A1C 6.0 (*) <5.7 %    Mean Plasma Glucose 126 (*) <117 mg/dL   HEPARIN LEVEL (UNFRACTIONATED)     Status: Abnormal   Collection Time   04/08/12  1:48 AM      Component Value Range Comment   Heparin Unfractionated 0.17 (*) 0.30 - 0.70 IU/mL   CBC     Status: Abnormal   Collection Time   04/08/12  1:48 AM      Component Value Range Comment   WBC 9.9  4.0 - 10.5 K/uL    RBC 5.01  4.22 - 5.81 MIL/uL    Hemoglobin 12.4 (*) 13.0 - 17.0 g/dL    HCT 54.0 (*) 98.1 - 52.0 %    MCV 74.1 (*) 78.0 - 100.0 fL    MCH 24.8 (*) 26.0 - 34.0 pg    MCHC 33.4  30.0 - 36.0 g/dL    RDW 19.1  47.8 - 29.5 %    Platelets 230  150 - 400 K/uL   BASIC METABOLIC PANEL     Status: Abnormal   Collection Time   04/08/12  1:48 AM      Component Value Range Comment   Sodium 139  135 - 145 mEq/L    Potassium 3.4 (*) 3.5 - 5.1 mEq/L    Chloride 104  96 - 112 mEq/L    CO2 25  19 - 32 mEq/L    Glucose, Bld 106 (*) 70 - 99 mg/dL    BUN 15  6 - 23 mg/dL    Creatinine, Ser 6.21  0.50 - 1.35 mg/dL    Calcium 8.8  8.4 - 30.8 mg/dL    GFR calc non Af Amer 89 (*) >90 mL/min    GFR calc Af Amer >90  >90 mL/min   LIPID PANEL     Status: Normal   Collection Time   04/08/12  1:48 AM      Component Value Range Comment   Cholesterol 178  0 - 200 mg/dL    Triglycerides 56  <657 mg/dL    HDL 71  >84 mg/dL    Total CHOL/HDL Ratio 2.5      VLDL 11  0 - 40 mg/dL    LDL Cholesterol 96  0 - 99 mg/dL   TROPONIN I     Status: Abnormal   Collection Time   04/08/12  8:27 AM      Component Value Range Comment   Troponin I 5.61 (*) <0.30 ng/mL   POCT ACTIVATED CLOTTING TIME     Status: Normal   Collection Time   04/08/12 10:01 AM      Component Value Range Comment   Activated Clotting Time 185  TROPONIN I     Status: Abnormal   Collection Time   04/08/12  12:35 PM      Component Value Range Comment   Troponin I 4.17 (*) <0.30 ng/mL   GLUCOSE, CAPILLARY     Status: Abnormal   Collection Time   04/08/12  4:41 PM      Component Value Range Comment   Glucose-Capillary 114 (*) 70 - 99 mg/dL   TROPONIN I     Status: Abnormal   Collection Time   04/08/12  6:56 PM      Component Value Range Comment   Troponin I 3.44 (*) <0.30 ng/mL   GLUCOSE, CAPILLARY     Status: Abnormal   Collection Time   04/08/12  8:26 PM      Component Value Range Comment   Glucose-Capillary 131 (*) 70 - 99 mg/dL   CBC     Status: Abnormal   Collection Time   04/09/12  5:21 AM      Component Value Range Comment   WBC 9.4  4.0 - 10.5 K/uL    RBC 5.38  4.22 - 5.81 MIL/uL    Hemoglobin 13.4  13.0 - 17.0 g/dL    HCT 45.4  09.8 - 11.9 %    MCV 75.3 (*) 78.0 - 100.0 fL    MCH 24.9 (*) 26.0 - 34.0 pg    MCHC 33.1  30.0 - 36.0 g/dL    RDW 14.7 (*) 82.9 - 15.5 %    Platelets 235  150 - 400 K/uL   CK TOTAL AND CKMB     Status: Abnormal   Collection Time   04/09/12  5:21 AM      Component Value Range Comment   Total CK 292 (*) 7 - 232 U/L    CK, MB 4.8 (*) 0.3 - 4.0 ng/mL    Relative Index 1.6  0.0 - 2.5   GLUCOSE, CAPILLARY     Status: Normal   Collection Time   04/09/12  8:21 AM      Component Value Range Comment   Glucose-Capillary 89  70 - 99 mg/dL        Medication List     As of 04/09/2012 12:58 PM    TAKE these medications         aspirin 81 MG chewable tablet   Chew 162 mg by mouth daily.      atorvastatin 10 MG tablet   Commonly known as: LIPITOR   Take 1 tablet (10 mg total) by mouth daily at 6 PM.      famotidine 20 MG tablet   Commonly known as: PEPCID   Take 1 tablet (20 mg total) by mouth 2 (two) times daily.      metoprolol 100 MG tablet   Commonly known as: LOPRESSOR   Please take 1 tablet twice daily for blood pressure and heart rate control.    Please do not take this medicine on the night before and the morning of your ablation  procedure on 05/06/2012.      sildenafil 100 MG tablet   Commonly known as: VIAGRA   Take 100 mg by mouth daily as needed. For erectile dysfunction.      Tamsulosin HCl 0.4 MG Caps   Commonly known as: FLOMAX   Take 0.4 mg by mouth daily.      terbinafine 250 MG tablet   Commonly known as: LAMISIL   Take 250 mg by mouth daily.  History of present illness: 61 y/o man with a h/o CAD with last cath in August 2010 that revealed a 50% second diagonal and a 70% mid to distal LAD due to spasm and compression. It was felt that medical therapy was indicated. He has had intermittent CP ever since. On the morning of admission at about 8, he starting having CP again that he described as SS, radiating into his left shoulder with tingling in his fingertips. In the ED he was noted to be in SVT with HRs in the 150-160s. Twice he converted to NSR spontaneously, a third time he converted after given cardizem (adenosine not used). The EDP has asked [place] to admit him for initiation of medications. Afterwards, a troponin has returned positive at 3.28. CP has now returned and EKG shows some ant TWI and mild ST depressions. Cardiology has been consulted.   Hospital Course: Principal Problem:  *Demand ischemia secondary to Paroxysmal SVT (supraventricular tachycardia) and NSVT Active Problems:  CAD-cardiac catheterization this admission revealed stable nonobstructive CAD  CHEST PAIN secondary to SVT-  HYPERTENSION (uncontrolled)  Leukocytosis  Hypokalemia  Anxiety  HYPERLIPIDEMIA  Chronic Microcytic anemia  GERD  Known CAD/Demand ischemia/CHEST PAIN Troponin at presentation was 0.3 NPH to 5.61 with current trend down. Chest pain has resolved. Cardiac catheterization this hospitalization revealed nonobstructive CAD which is stable. Cardiology felt abnormal troponins are related to demand ischemia secondary to heart arrhythmia. CPK was also elevated and is also trending downward. Ventriculography  during cardiac as well as echocardiogram revealed preserved systolic function.  Paroxysmal SVT (supraventricular tachycardia)/NSVT Arrhythmia initially controlled with beta blockers and amiodarone and subsequently switched to beta blockers only. Electrophysiology cardiologist was consulted and recommended ablation procedure. Patient's rhythm is stable and patient requested if possible to discharge home to proceed with ablation after Thanksgiving holiday. He has been scheduled for an ablation procedure on 05/06/2012. At time of discharge patient maintaining normal sinus rhythm without evidence of recurrent bursts of SVT  HYPERTENSION Blood pressure has been uncontrolled since presentation. Patient was started on Lopressor this admission and currently is on 100 mg by mouth twice a day. He was also on Flomax prior to admission and I suspect this was for BPH. We will continue this medication after discharge  Anxiety/insomnia This is a chronic problem for this patient. He describes himself as being "high strung". He also endorses difficulty sleeping and has frequent nocturnal awakenings which he describes as jerking himself awake. Patient may have an underlying sleep disorder and may benefit from an outpatient polysomnogram. He also be experiencing adrenal surges related to nocturnal SVT without associated awareness of tachypalpitations. Patient was counseled on nonpharmacological approaches to insomnia and was also made aware of over-the-counter medications he could use such as Benadryl or melatonin. Patient was provided when necessary Xanax to use during the hospitalization but was told he would not be receiving this medication at time of discharge.  HYPERLIPIDEMIA Lipid panel this admission was within normal limits  GERD Airway is quite symptomatic and is causing sleep disturbance. He was started on Pepcid twice daily this admission. It appears he was not taking any medications prior to admission.  Patient counseled that if symptoms do not improve after 46 weeks of regular use of Pepcid as prescribed he is to talk with his primary care physician about the possibility of a gastroenterology evaluation as an outpatient    Day of Discharge BP 173/119  Pulse 73  Temp 98.5 F (36.9 C) (Oral)  Resp 25  Ht  6\' 1"  (1.854 m)  Wt 89.6 kg (197 lb 8.5 oz)  BMI 26.06 kg/m2  SpO2 99%  Physical Exam: General: No acute respiratory distress  Lungs: Clear to auscultation bilaterally without wheezes or crackles  Cardiovascular: Regular rate and rhythm without murmur gallop or rub normal S1 and S2, no peripheral edema  Abdomen: Nontender, nondistended, soft, bowel sounds positive, no rebound, no ascites, no appreciable mass  Musculoskeletal: No significant cyanosis, clubbing of bilateral lower extremities  Neurological: Patient is alert and oriented x3, moves all extremities x4, exam non-focal   Follow-up: Dr. Ladona Ridgel on 05/06/2012 at 11:30 AM for ablation procedure at [facility] short stay unit. Patient has been instructed to arrive at 9:30 AM and has been given instructions regarding n.p.o. status and holding beta blockers. Labs will be obtained after arrival to short stay.  Dr. Gildardo Cranker in 2 weeks for routine hospital followup-call to schedule appointment  Disposition:  Discharge to home with wife   Junious Silk, ANP pager [phone]  I have examined the patient and reviewed the chart. I agree with the above d/c summary.   Calvert Cantor, MD [phone]

## 2012-04-09 NOTE — Progress Notes (Signed)
 ELECTROPHYSIOLOGY ROUNDING NOTE    Patient Name: Cody Brewer Date of Encounter: 04-09-2012    SUBJECTIVE: Patient feels well.  No chest pain or shortness of breath.    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  Pt interested in pursuing definitive treatment options.   Blood pressure has been elevated-- currently on Metoprolol 50mg bid.   TELEMETRY: Reviewed telemetry pt in sinus rhythm with occasional PVC's Filed Vitals:   04/08/12 2147 04/08/12 2300 04/09/12 0405 04/09/12 0525  BP: 173/119     Pulse:      Temp:  98.6 F (37 C) 98.5 F (36.9 C)   TempSrc:  Oral    Resp:      Height:      Weight:    197 lb 8.5 oz (89.6 kg)  SpO2:        Intake/Output Summary (Last 24 hours) at 04/09/12 0722 Last data filed at 04/09/12 0500  Gross per 24 hour  Intake 1269.65 ml  Output   1000 ml  Net 269.65 ml    Well appearing NAD HEENT: Unremarkable Neck:  No JVD, no thyromegally Lungs:  Clear with no wheezes HEART:  Regular rate rhythm, no murmurs, no rubs, no clicks Abd:  Flat, positive bowel sounds, no organomegally, no rebound, no guarding Ext:  2 plus pulses, no edema, no cyanosis, no clubbing Skin:  No rashes no nodules Neuro:  CN II through XII intact, motor grossly intact   LABS: Basic Metabolic Panel:  Basename 04/08/12 0148 04/07/12 1145  NA 139 141  K 3.4* 4.4  CL 104 102  CO2 25 27  GLUCOSE 106* 101*  BUN 15 17  CREATININE 0.95 0.90  CALCIUM 8.8 9.6  MG -- 2.2  PHOS -- --   CBC:  Basename 04/09/12 0521 04/08/12 0148 04/07/12 1145  WBC 9.4 9.9 --  NEUTROABS -- -- 10.7*  HGB 13.4 12.4* --  HCT 40.5 37.1* --  MCV 75.3* 74.1* --  PLT 235 230 --   Cardiac Enzymes:  Basename 04/09/12 0521 04/08/12 1856 04/08/12 1235 04/08/12 0827 04/07/12 1748  CKTOTAL 292* -- -- -- 842*  CKMB 4.8* -- -- -- 26.5*  CKMBINDEX -- -- -- -- --  TROPONINI --  3.44* 4.17* 5.61* --   Hemoglobin A1C:  Basename 04/07/12 2006  HGBA1C 6.0*   Fasting Lipid Panel:  Basename 04/08/12 0148  CHOL 178  HDL 71  LDLCALC 96  TRIG 56  CHOLHDL 2.5  LDLDIRECT --   Thyroid Function Tests:  Basename 04/07/12 2006  TSH 0.877  T4TOTAL --  T3FREE --  THYROIDAB --    Radiology/Studies: Dg Chest 2 View 04/07/2012  *RADIOLOGY REPORT*  Clinical Data: Chest pain.  Previous myocardial infarction.  CHEST - 2 VIEW  Comparison: The same day  Findings: Heart size is normal.  There is calcification and unfolding of the aorta.  Lungs are clear except for mild scarring. The vascularity is normal.  No effusions.  No bony abnormalities.  IMPRESSION: Mild pulmonary scarring.  No active disease.   Original Report Authenticated By: Mark Shogry, M.D.      SVT ablation scheduled for 05-06-2012 at 11:30AM with Dr Taylor.  Pt should arrive at short stay at 9:30AM.  Nothing to eat or drink after midnight the night before.  He should hold Metoprolol night before and morning of procedure.  We will draw labs at   hospital morning of procedure.   EP attending  Patient seen and examined. Agree with above. He is scheduled for catheter ablation in 4 weeks. Ok for discharge on medical therapy.  Gregg Taylor,M.D. 

## 2012-04-28 ENCOUNTER — Encounter (HOSPITAL_COMMUNITY): Payer: Self-pay | Admitting: Pharmacy Technician

## 2012-05-03 ENCOUNTER — Telehealth: Payer: Self-pay | Admitting: Internal Medicine

## 2012-05-03 NOTE — Telephone Encounter (Signed)
lmom for pt to return my call. 

## 2012-05-03 NOTE — Telephone Encounter (Signed)
plz return call to pt [phone] regarding Thursday 05/06/12 procedure.

## 2012-05-04 NOTE — Telephone Encounter (Signed)
lmom for pt to return my call. 

## 2012-05-04 NOTE — Telephone Encounter (Signed)
Follow-up:    Patient returned your call.  Please call back. 

## 2012-05-05 NOTE — Telephone Encounter (Signed)
Follow-up:    Patient returned [facility] call.

## 2012-05-05 NOTE — Telephone Encounter (Signed)
Pt was upset because no one has called him to give information about the procedure is scheduled to have tomorrow 05/06/12 in the [facility] cath Lab.  Instructions were given in the hospital prior discharge by Junious Silk ANP. I went over the instructions again with pt. He is to  arrive to the Short stay unit at [facility] hospital at 9:30 AM tomorrow 05/06/12, he is to be NPO after MN, and hold the metoprolol and take the other medication with a zip of water. Pt and wife aware, and verbalized understanding.

## 2012-05-06 ENCOUNTER — Ambulatory Visit (HOSPITAL_COMMUNITY)
Admission: RE | Admit: 2012-05-06 | Discharge: 2012-05-07 | Disposition: A | Discharge status: Home / Self Care | Payer: 59 | Source: Ambulatory Visit | Attending: Internal Medicine | Admitting: Internal Medicine

## 2012-05-06 ENCOUNTER — Encounter (HOSPITAL_COMMUNITY): Payer: Self-pay | Admitting: General Practice

## 2012-05-06 ENCOUNTER — Encounter (HOSPITAL_COMMUNITY)
Admission: RE | Disposition: A | Discharge status: Home / Self Care | Payer: Self-pay | Source: Ambulatory Visit | Attending: Internal Medicine

## 2012-05-06 DIAGNOSIS — I10 Essential (primary) hypertension: Secondary | ICD-10-CM

## 2012-05-06 DIAGNOSIS — D72829 Elevated white blood cell count, unspecified: Secondary | ICD-10-CM

## 2012-05-06 DIAGNOSIS — Z8679 Personal history of other diseases of the circulatory system: Secondary | ICD-10-CM

## 2012-05-06 DIAGNOSIS — I248 Other forms of acute ischemic heart disease: Secondary | ICD-10-CM

## 2012-05-06 DIAGNOSIS — I471 Supraventricular tachycardia: Secondary | ICD-10-CM

## 2012-05-06 DIAGNOSIS — R079 Chest pain, unspecified: Secondary | ICD-10-CM

## 2012-05-06 DIAGNOSIS — I498 Other specified cardiac arrhythmias: Secondary | ICD-10-CM | POA: Insufficient documentation

## 2012-05-06 DIAGNOSIS — R718 Other abnormality of red blood cells: Secondary | ICD-10-CM

## 2012-05-06 DIAGNOSIS — K219 Gastro-esophageal reflux disease without esophagitis: Secondary | ICD-10-CM

## 2012-05-06 DIAGNOSIS — E876 Hypokalemia: Secondary | ICD-10-CM

## 2012-05-06 DIAGNOSIS — I2510 Atherosclerotic heart disease of native coronary artery without angina pectoris: Secondary | ICD-10-CM | POA: Insufficient documentation

## 2012-05-06 DIAGNOSIS — E785 Hyperlipidemia, unspecified: Secondary | ICD-10-CM | POA: Insufficient documentation

## 2012-05-06 DIAGNOSIS — F419 Anxiety disorder, unspecified: Secondary | ICD-10-CM

## 2012-05-06 DIAGNOSIS — D509 Iron deficiency anemia, unspecified: Secondary | ICD-10-CM

## 2012-05-06 HISTORY — DX: Pure hypercholesterolemia, unspecified: E78.00

## 2012-05-06 HISTORY — DX: Dorsalgia, unspecified: M54.9

## 2012-05-06 HISTORY — DX: Other chronic pain: G89.29

## 2012-05-06 HISTORY — DX: Personal history of other diseases of the circulatory system: Z86.79

## 2012-05-06 HISTORY — PX: SUPRAVENTRICULAR TACHYCARDIA ABLATION: SHX5492

## 2012-05-06 LAB — CBC
HCT: 43.2 % (ref 39.0–52.0)
Hemoglobin: 14.1 g/dL (ref 13.0–17.0)
MCH: 24.9 pg — ABNORMAL LOW (ref 26.0–34.0)
MCHC: 32.6 g/dL (ref 30.0–36.0)
MCV: 76.2 fL — ABNORMAL LOW (ref 78.0–100.0)
Platelets: 253 K/uL (ref 150–400)
RBC: 5.67 MIL/uL (ref 4.22–5.81)
RDW: 15.2 % (ref 11.5–15.5)
WBC: 6.1 K/uL (ref 4.0–10.5)

## 2012-05-06 LAB — BASIC METABOLIC PANEL WITH GFR
BUN: 14 mg/dL (ref 6–23)
CO2: 25 meq/L (ref 19–32)
Calcium: 9.3 mg/dL (ref 8.4–10.5)
Chloride: 105 meq/L (ref 96–112)
Creatinine, Ser: 0.89 mg/dL (ref 0.50–1.35)
GFR calc Af Amer: >90 mL/min
GFR calc non Af Amer: >90 mL/min
Glucose, Bld: 94 mg/dL (ref 70–99)
Potassium: 3.9 meq/L (ref 3.5–5.1)
Sodium: 141 meq/L (ref 135–145)

## 2012-05-06 SURGERY — SUPRAVENTRICULAR TACHYCARDIA ABLATION
Anesthesia: LOCAL

## 2012-05-06 MED ORDER — SODIUM CHLORIDE 0.9 % IJ SOLN: 3.0000 mL | INTRAMUSCULAR | Status: DC | PRN

## 2012-05-06 MED ORDER — SODIUM CHLORIDE 0.9 % IV SOLN: 250.0000 mL | INTRAVENOUS | Status: DC | PRN

## 2012-05-06 MED ORDER — TAMSULOSIN HCL 0.4 MG PO CAPS
0.4000 mg | ORAL_CAPSULE | Freq: Every day | ORAL | Status: DC
  Administered 2012-05-06: 0.4 mg via ORAL
  Filled 2012-05-06 (×3): qty 1

## 2012-05-06 MED ORDER — FAMOTIDINE 20 MG PO TABS
20.0000 mg | ORAL_TABLET | Freq: Two times a day (BID) | ORAL | Status: DC
  Administered 2012-05-06 – 2012-05-07 (×2): 20 mg via ORAL
  Filled 2012-05-06 (×4): qty 1

## 2012-05-06 MED ORDER — HEPARIN (PORCINE) IN NACL 2-0.9 UNIT/ML-% IJ SOLN
INTRAMUSCULAR | Status: AC
  Filled 2012-05-06: qty 500

## 2012-05-06 MED ORDER — BUPIVACAINE HCL (PF) 0.25 % IJ SOLN
INTRAMUSCULAR | Status: AC
  Filled 2012-05-06: qty 60

## 2012-05-06 MED ORDER — METOPROLOL TARTRATE 100 MG PO TABS
100.0000 mg | ORAL_TABLET | Freq: Two times a day (BID) | ORAL | Status: DC
  Administered 2012-05-06 – 2012-05-07 (×2): 100 mg via ORAL
  Filled 2012-05-06 (×4): qty 1

## 2012-05-06 MED ORDER — ASPIRIN 81 MG PO CHEW
162.0000 mg | CHEWABLE_TABLET | Freq: Every day | ORAL | Status: DC
  Administered 2012-05-07: 10:00:00 162 mg via ORAL
  Filled 2012-05-06: qty 2

## 2012-05-06 MED ORDER — SODIUM CHLORIDE 0.9 % IV SOLN
INTRAVENOUS | Status: DC
  Administered 2012-05-06: 10:00:00 via INTRAVENOUS

## 2012-05-06 MED ORDER — HYDRALAZINE HCL 20 MG/ML IJ SOLN
10.0000 mg | Freq: Four times a day (QID) | INTRAMUSCULAR | Status: DC | PRN
  Administered 2012-05-06 (×2): 10 mg via INTRAVENOUS
  Filled 2012-05-06 (×2): qty 0.5

## 2012-05-06 MED ORDER — MIDAZOLAM HCL 5 MG/5ML IJ SOLN
INTRAMUSCULAR | Status: AC
  Filled 2012-05-06: qty 5

## 2012-05-06 MED ORDER — ONDANSETRON HCL 4 MG/2ML IJ SOLN: 4.0000 mg | Freq: Four times a day (QID) | INTRAMUSCULAR | Status: DC | PRN

## 2012-05-06 MED ORDER — FENTANYL CITRATE 0.05 MG/ML IJ SOLN
INTRAMUSCULAR | Status: AC
  Filled 2012-05-06: qty 2

## 2012-05-06 MED ORDER — HYDRALAZINE HCL 20 MG/ML IJ SOLN
INTRAMUSCULAR | Status: AC
  Filled 2012-05-06: qty 1

## 2012-05-06 MED ORDER — ACETAMINOPHEN 325 MG PO TABS: 650.0000 mg | ORAL_TABLET | ORAL | Status: DC | PRN

## 2012-05-06 MED ORDER — SODIUM CHLORIDE 0.9 % IJ SOLN
3.0000 mL | Freq: Two times a day (BID) | INTRAMUSCULAR | Status: DC
  Administered 2012-05-06: 3 mL via INTRAVENOUS

## 2012-05-06 NOTE — H&P (View-Only) (Signed)
ELECTROPHYSIOLOGY ROUNDING NOTE    Patient Name: Cody Brewer Date of Encounter: 04-09-2012    SUBJECTIVE: Patient feels well.  No chest pain or shortness of breath.    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  Pt interested in pursuing definitive treatment options.   Blood pressure has been elevated-- currently on Metoprolol 50mg  bid.   TELEMETRY: Reviewed telemetry pt in sinus rhythm with occasional PVC's Filed Vitals:   04/08/12 2147 04/08/12 2300 04/09/12 0405 04/09/12 0525  BP: 173/119     Pulse:      Temp:  98.6 F (37 C) 98.5 F (36.9 C)   TempSrc:  Oral    Resp:      Height:      Weight:    197 lb 8.5 oz (89.6 kg)  SpO2:        Intake/Output Summary (Last 24 hours) at 04/09/12 1610 Last data filed at 04/09/12 0500  Gross per 24 hour  Intake 1269.65 ml  Output   1000 ml  Net 269.65 ml    Well appearing NAD HEENT: Unremarkable Neck:  No JVD, no thyromegally Lungs:  Clear with no wheezes HEART:  Regular rate rhythm, no murmurs, no rubs, no clicks Abd:  Flat, positive bowel sounds, no organomegally, no rebound, no guarding Ext:  2 plus pulses, no edema, no cyanosis, no clubbing Skin:  No rashes no nodules Neuro:  CN II through XII intact, motor grossly intact   LABS: Basic Metabolic Panel:  Basename 04/08/12 0148 04/07/12 1145  NA 139 141  K 3.4* 4.4  CL 104 102  CO2 25 27  GLUCOSE 106* 101*  BUN 15 17  CREATININE 0.95 0.90  CALCIUM 8.8 9.6  MG -- 2.2  PHOS -- --   CBC:  Basename 04/09/12 0521 04/08/12 0148 04/07/12 1145  WBC 9.4 9.9 --  NEUTROABS -- -- 10.7*  HGB 13.4 12.4* --  HCT 40.5 37.1* --  MCV 75.3* 74.1* --  PLT 235 230 --   Cardiac Enzymes:  Basename 04/09/12 0521 04/08/12 1856 04/08/12 1235 04/08/12 0827 04/07/12 1748  CKTOTAL 292* -- -- -- 842*  CKMB 4.8* -- -- -- 26.5*  CKMBINDEX -- -- -- -- --  TROPONINI --  3.44* 4.17* 5.61* --   Hemoglobin A1C:  Basename 04/07/12 2006  HGBA1C 6.0*   Fasting Lipid Panel:  Basename 04/08/12 0148  CHOL 178  HDL 71  LDLCALC 96  TRIG 56  CHOLHDL 2.5  LDLDIRECT --   Thyroid Function Tests:  Basename 04/07/12 2006  TSH 0.877  T4TOTAL --  T3FREE --  THYROIDAB --    Radiology/Studies: Dg Chest 2 View 04/07/2012  *RADIOLOGY REPORT*  Clinical Data: Chest pain.  Previous myocardial infarction.  CHEST - 2 VIEW  Comparison: The same day  Findings: Heart size is normal.  There is calcification and unfolding of the aorta.  Lungs are clear except for mild scarring. The vascularity is normal.  No effusions.  No bony abnormalities.  IMPRESSION: Mild pulmonary scarring.  No active disease.   Original Report Authenticated By: Paulina Fusi, M.D.      SVT ablation scheduled for 05-06-2012 at 11:30AM with Dr Ladona Ridgel.  Pt should arrive at short stay at 9:30AM.  Nothing to eat or drink after midnight the night before.  He should hold Metoprolol night before and morning of procedure.  We will draw labs at  hospital morning of procedure.   EP attending  Patient seen and examined. Agree with above. He is scheduled for catheter ablation in 4 weeks. Ok for discharge on medical therapy.  Leonia Reeves.D.

## 2012-05-06 NOTE — Interval H&P Note (Signed)
History and Physical Interval Note:  05/06/2012 2:33 PM  Cody Brewer  has presented today for surgery, with the diagnosis of svt  The various methods of treatment have been discussed with the patient and family. After consideration of risks, benefits and other options for treatment, the patient has consented to  Procedure(s) (LRB) with comments: SUPRAVENTRICULAR TACHYCARDIA ABLATION (N/A) as a surgical intervention .  The patient's history has been reviewed, patient examined, no change in status, stable for surgery.  I have reviewed the patient's chart and labs.  Questions were answered to the patient's satisfaction.     Leonia Reeves.D.

## 2012-05-06 NOTE — Op Note (Signed)
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 [id number].

## 2012-05-06 NOTE — Interval H&P Note (Signed)
History and Physical Interval Note: Patient seen and examined. Since prior hospital visit, no change in the history physical exam, assessment and plan. For SVT ablation.   05/06/2012 9:59 AM  Cody Brewer  has presented today for surgery, with the diagnosis of svt  The various methods of treatment have been discussed with the patient and family. After consideration of risks, benefits and other options for treatment, the patient has consented to  Procedure(s) (LRB) with comments: SUPRAVENTRICULAR TACHYCARDIA ABLATION (N/A) as a surgical intervention .  The patient's history has been reviewed, patient examined, no change in status, stable for surgery.  I have reviewed the patient's chart and labs.  Questions were answered to the patient's satisfaction.     Cody Brewer

## 2012-05-07 DIAGNOSIS — I471 Supraventricular tachycardia: Secondary | ICD-10-CM

## 2012-05-07 MED ORDER — OFF THE BEAT BOOK
Freq: Once | Status: DC
  Filled 2012-05-07: qty 1

## 2012-05-07 NOTE — Discharge Summary (Signed)
ELECTROPHYSIOLOGY DISCHARGE SUMMARY    Patient ID: Cody Brewer,  MRN: [id number], DOB/AGE: 02-10-1951 61 y.o.  Admit date: 05/06/2012 Discharge date: 05/07/2012  Primary Care Physician: Gildardo Cranker, MD Primary Cardiologist: Jens Som, MD  Primary Discharge Diagnosis:  1. 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 Nonobstructive CAD 2. HTN 3. Dyslipidemia  Procedures This Admission:  1. 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 Baseline ECG: Baseline ECG demonstrates sinus rhythm with  normal axis and intervals. There was no pre-excitation.  B. Baseline intervals: The sinus node cycle length was 840  milliseconds, QRS duration 120 milliseconds, the HV interval 56  milliseconds.  C. Rapid ventricular pacing: The rapid atrial pacing was carried out  and was stepwise decreased down to 320 milliseconds. Prior to ablation,  the rapid ventricular pacing would result in initiation of SVT.  Following catheter ablation, rapid atrial pacing demonstrated no  inducible SVT.  D. Programmed ventricular stimulation: Programmed ventricular  stimulation was carried out from the right ventricle at a base drive  cycle length of 409 milliseconds. The S1-S2 interval was stepwise  decreased down to 230 milliseconds where ventricular refractoriness was  observed. During programmed ventricular stimulation, the atrial  activation sequence was earliest in the CS os.  E. Rapid atrial pacing: The rapid atrial pacing was carried out from  the atrium at a base drive cycle length of 811 milliseconds and was  stepwise decreased down to 340 milliseconds resulting in AV Wenckebach.  Following catheter ablation, rapid atrial pacing was carried out down to  320  milliseconds resulted in AV Wenckebach.  F. Programmed atrial stimulation: Programmed atrial stimulation was  carried out from the atrium at a base drive cycle length of 914  milliseconds. The S1-S2 interval was stepwise decreased from 440  milliseconds down to 290 milliseconds where atrial refractoriness was  observed. During programmed atrial stimulation prior to ablation, there  was easily inducible SVT and S1-S2 coupling interval of 500/390.  Following ablation, there was no inducible SVT.  G. Arrhythmias observed.  1. 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 The  duration was sustained. Cycle length was 400 milliseconds. Method  of termination was with rapid atrial pacing.  a. Mapping: Mapping of Koch's triangle demonstrated the  earliest atrial activation during tachycardia to occur on the  anterior lip of the coronary sinus just before the tricuspid valve  annulus.  b. RF energy application: Total of three RF energy  applications were delivered to the anterior lip of the coronary  sinus resulting in termination of the patient's inducible  tachycardia.  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  History and Hospital Course:  Mr. 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 Please see results/findings as outlined above. He tolerated this procedure well without any immediate complication.  He remains hemodynamically stable and afebrile. His groin site is intact without significant  bleeding or hematoma. He has been given discharge instructions including wound care and activity restrictions. He will follow-up with Dr. Ladona Ridgel in clinic in 12 weeks. He was instructed to follow-up with his PCP in 1 week for BP management. He has been seen, examined and deemed stable for discharge today by Dr. [name].  Discharge Vitals: Blood pressure 147/91, pulse 79, temperature 99 F (37.2 C), temperature source Oral, resp. rate 16, height 6\' 1"  (1.854 m), weight 188 lb 11.4 oz (85.6 kg), SpO2 99.00%.   Labs: Lab Results  Component Value Date   WBC 6.1 05/06/2012   HGB 14.1 05/06/2012   HCT 43.2 05/06/2012   MCV 76.2* 05/06/2012   PLT 253 05/06/2012     Lab 05/06/12 1055  NA 141  K 3.9  CL 105  CO2 25  BUN 14  CREATININE 0.89  CALCIUM 9.3  PROT --  BILITOT --  ALKPHOS --  ALT --  AST --  GLUCOSE 94    Basename 05/06/12 1055  INR 1.01    Disposition:  The patient is being discharged in stable condition.  Follow-up:     Follow-up Information    Follow up with [name], MD. On 08/10/2012. (At 10:30 AM)    Contact information:   [address] [place] [address] [phone]      Follow up with [name], MD. Schedule an appointment as soon as possible for a visit in 1 week. (For BP management/follow-up)    Contact information:   [address] [place] [address] [phone]        Discharge Medications:    Medication List     As of 05/07/2012  8:49 AM    TAKE these medications         aspirin 81 MG chewable tablet   Chew 162 mg by mouth daily.      famotidine 20 MG tablet   Commonly known as: PEPCID   Take 1 tablet (20 mg total) by mouth 2 (two) times daily.      FISH OIL PO   Take 2 capsules by mouth daily.      metoprolol 100 MG tablet   Commonly known as: LOPRESSOR   Take 100 mg by mouth 2 (two) times daily.      Tamsulosin HCl 0.4 MG Caps   Commonly known as: FLOMAX   Take 0.4 mg by mouth  daily.        Duration of Discharge Encounter: Greater than 30 minutes including physician time.  Signed, Rick Duff, PA-C 05/07/2012, 8:49 AM

## 2012-05-07 NOTE — Progress Notes (Signed)
Patient ID: Cody Brewer, male   DOB: 1950-09-09, 61 y.o.   MRN: [id number] Subjective:  No chest pain or sob.   Objective:  Vital Signs in the last 24 hours: Temp:  [97 F (36.1 C)-99.4 F (37.4 C)] 99 F (37.2 C) (12/20 0420) Pulse Rate:  [71-93] 78  (12/20 0420) Resp:  [15-20] 18  (12/20 0420) BP: (135-184)/(85-117) 135/85 mmHg (12/20 0420) SpO2:  [100 %] 100 % (12/20 0420) Weight:  [188 lb 11.4 oz (85.6 kg)-194 lb (87.998 kg)] 188 lb 11.4 oz (85.6 kg) (12/20 0000)  Intake/Output from previous day: 12/19 0701 - 12/20 0700 In: 240 [P.O.:240] Out: 1000 [Urine:1000] Intake/Output from this shift:    Physical Exam: Well appearing NAD HEENT: Unremarkable Neck:  No JVD, no thyromegally Lungs:  Clear HEART:  Regular rate rhythm, no murmurs, no rubs, no clicks Abd:  Flat, positive bowel sounds, no organomegally, no rebound, no guarding Ext:  2 plus pulses, no edema, no cyanosis, no clubbing, no hematoma Skin:  No rashes no nodules Neuro:  CN II through XII intact, motor grossly intact  Lab Results:  Basename 05/06/12 1055  WBC 6.1  HGB 14.1  PLT 253    Basename 05/06/12 1055  NA 141  K 3.9  CL 105  CO2 25  GLUCOSE 94  BUN 14  CREATININE 0.89   No results found for this basename: TROPONINI:2,CK,MB:2 in the last 72 hours Hepatic Function Panel No results found for this basename: PROT,ALBUMIN,AST,ALT,ALKPHOS,BILITOT,BILIDIR,IBILI in the last 72 hours No results found for this basename: CHOL in the last 72 hours No results found for this basename: PROTIME in the last 72 hours  Imaging: No results found.  Cardiac Studies: Tele - NSR Assessment/Plan:  1. SVT 2. 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  Rec: ok for discharge. Usual followup. He has marked HTN and would continue his current meds. Medication adjustment can be made at followup.  LOS: 1 day    Visente Kirker,M.D. 05/07/2012, 7:51 AM

## 2012-05-07 NOTE — Progress Notes (Signed)
Nutrition Brief Note  Patient identified on the Malnutrition Screening Tool (MST) Report. Upon chart review, pt is eating well, consuming 100% of meals.   Body mass index is 24.90 kg/(m^2). Pt meets criteria for weight is WNL based on current BMI.   Current diet order is Heart Healthy, patient is consuming approximately 100% of meals at this time. Labs and medications reviewed.   No nutrition interventions warranted at this time. If nutrition issues arise, please consult RD.   Jarold Motto MS, RD, LDN Pager: [phone] After-hours pager: [phone]

## 2012-05-07 NOTE — Op Note (Signed)
NAME:  Cody Brewer, Cody Brewer NO.:  [id number]  MEDICAL RECORD NO.:  [id number]  LOCATION:  6533                         FACILITY:  [facility]  PHYSICIAN:  Doylene Canning. 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  INTRODUCTION:  The patient is a 61 year old man with incessant SVT, who has been refractory to medical therapy.  He has had documented SVT at rates of 140-150 beats per minute.  He is now referred for catheter ablation.  PROCEDURE:  After informed consent was obtained, the patient was taken to the Diagnostic EP Lab in the fasting state.  After usual preparation and draping, intravenous fentanyl and midazolam was given for sedation. A 6-French hexapolar catheter was inserted percutaneously in the right jugular vein and advanced to the coronary sinus.  A 6-French quadripolar catheter was inserted percutaneously in the right femoral vein and advanced to the right ventricle.  A 6-French quadripolar catheter was inserted percutaneously in the right femoral vein and advanced to the His bundle region.  After measurement of the basic intervals, rapid ventricular pacing was carried out from the right ventricle and stepwise decreased down to 320 milliseconds where VA Wenckebach was observed. During rapid ventricular pacing, the atrial activation was midline and decremental.  It should be noted, however, that the CS56 catheter was somewhat earlier than HiSS catheter.  Next, programmed atrial stimulation was carried out from the right ventricle at a base drive cycle length of 161 milliseconds.  The S1-S2 interval was stepwise decreased down to 230 milliseconds.  At an S1-S2 coupling interval of 600/230, the ventricular refractoriness was observed.  Next, rapid ventricular  pacing was carried out from the right ventricle at a base drive cycle of 096 milliseconds and was stepwise decreased down to 320 milliseconds resulting in the initiation of SVT.  During SVT, the atrial activation was earliest in the CS56 compared to the HiSS.  During SVT, ventricular pacing demonstrated a VA-AV activation sequence and ventricular pacing would reproducibly terminated the tachycardia.  Next, programmed ventricular stimulation was carried out from the right ventricle.  Next, programmed atrial stimulation was carried out from the coronary sinus and the high right atrium and stepwise decreased down to 390 milliseconds resulting in the initiation of SVT.  During programmed atrial stimulation, there were no clearcut AH jumps.  Next, rapid atrial pacing was carried out from the atrium at a base drive cycle length of 045 milliseconds and was stepwise decreased down to 400 milliseconds again resulting in the initiation of SVT.  During SVT, this was a long RP tachycardia.  During SVT, PVCs were placed at the time of His bundle refractoriness and they did not clearly preexcite the atrium.  There was a question of whether or not the PVC is post-excited the atrium raising the suspicion of PJRT.  Unfortunately, this was not reproducibly producible.  At this point, a 7-French quadripolar ablation catheter was utilized to map of Koch's triangle.  Koch's triangle was notable and that the coronary sinus os  was very large and the anterior space in Koch's triangle was very small.  This made the ablation procedure technically more difficult.  The earliest atrial activation was mapped during SVT and was found to be on the anterior lip of the coronary sinus just before the tricuspid valve annulus.  Three RF energy applications were delivered to this area and this resulted in termination of the patient's tachycardia.  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  The patient was observed for 20 minutes and during this time, pacing was carried out and there was no inducible SVT.  The catheters were then removed.  Hemostasis was assured and the patient was returned to his room in satisfactory condition.  COMPLICATIONS:  There were no immediate procedure complications.  RESULTS:  A.  Baseline ECG:  Baseline ECG demonstrates sinus rhythm with normal axis and intervals.  There was no pre-excitation. B.  Baseline intervals:  The sinus node cycle length was 840 milliseconds, QRS duration 120 milliseconds, the HV interval 56 milliseconds. C.  Rapid ventricular pacing:  The rapid atrial pacing was carried out and was stepwise decreased down to 320 milliseconds.  Prior to ablation, the rapid ventricular pacing would result in initiation of SVT. Following catheter ablation, rapid atrial pacing demonstrated no inducible SVT. D.  Programmed ventricular stimulation:  Programmed ventricular stimulation was carried out from the right ventricle at a base drive cycle length of 213 milliseconds.  The S1-S2 interval was stepwise decreased down to 230 milliseconds where ventricular refractoriness was observed.  During programmed ventricular stimulation, the atrial activation sequence was earliest in the CS os. E.  Rapid atrial pacing:  The rapid atrial pacing was carried out from the atrium at a base drive cycle length of 086 milliseconds and was stepwise decreased down to 340 milliseconds resulting in AV Wenckebach. Following catheter ablation, rapid atrial pacing was carried out down to 320 milliseconds resulted in AV Wenckebach. F.  Programmed atrial stimulation:  Programmed atrial stimulation was carried out from the atrium at a base drive  cycle length of 578 milliseconds.  The S1-S2 interval was stepwise decreased from 440 milliseconds down to 290 milliseconds where atrial refractoriness was observed.  During programmed atrial stimulation prior to ablation, there was easily inducible SVT and S1-S2 coupling interval of 500/390. Following ablation, there was no inducible SVT. G.  Arrhythmias observed. 1. SVT, PJRT versus unusual AVNRT, initiation was with programmed     atrial stimulation as well as rapid ventricular pacing.  The     duration was sustained.  Cycle length was 400 milliseconds.  Method     of termination was with rapid atrial pacing.     a.     Mapping:  Mapping of Koch's triangle demonstrated the      earliest atrial activation during tachycardia to occur on the      anterior lip of the coronary sinus just before the tricuspid valve      annulus.     b.     RF energy application:  Total of three RF energy      applications were delivered to the anterior lip of the coronary      sinus resulting in termination of the patient's inducible      tachycardia.  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     Doylene Canning. Ladona Ridgel, MD     GWT/MEDQ  D:  05/06/2012  T:  05/07/2012  Job:  [id number]

## 2012-06-18 ENCOUNTER — Telehealth: Payer: Self-pay | Admitting: Internal Medicine

## 2012-06-18 DIAGNOSIS — I471 Supraventricular tachycardia: Secondary | ICD-10-CM

## 2012-06-18 NOTE — Telephone Encounter (Signed)
Pt had SVT ablation on 05/06/12 with Dr. Ladona Ridgel and is concerned that he continues to have the same symptoms as before the ablation of CP and sob with exertion. Also, he is concerned that his f/u with Dr. Ladona Ridgel is in April and he feels it should be sooner. Pt is advised that Dr. Ladona Ridgel and his nurse is out of office today and will be here on Monday and will contact him to address his concerns. Pt is advised to call 911 if symstoms worsens, he verbalized understanding.  Note forwarded to Tresa Endo, Dr Lubertha Basque nurse.

## 2012-06-18 NOTE — Telephone Encounter (Signed)
New problem:   Aware that nurse is off today.    S/p ablation done about 3-4 weeks.  C/O not feeling well today. Still having the same problems as before

## 2012-06-22 NOTE — Telephone Encounter (Signed)
Called patient and let him know Dr Lubertha Basque recommendations.  I have place the order for monitor and I have sent a staff message to [facility] pool to obtain.  Patient aware

## 2012-06-22 NOTE — Telephone Encounter (Signed)
Having monitor 06/23/12

## 2012-06-22 NOTE — Telephone Encounter (Addendum)
Called patient back and lmom for him to return my call.  As I was leaving him a message, he was calling the office.  I have spoken to patient and he is c/o CP(7 out of 10), SOB and his heart racing when he moves around.  He is very hard to pin point down with symptoms.  It sounds to me he is more concerned with the CP, but that the HR is also an issue.  I have ex[plained to him that after an ablation it is normal to still have some "fluttering or racing" while the heart is healing.  He expressed that he "feels the dame as before going into the hospital"  He had a cath 03/2012 that showed non-obstructive CAD.  I have discussed with Dr Ladona Ridgel.  We will get a 48 hour holter monitor to see what his heart rates are doing

## 2012-06-23 ENCOUNTER — Other Ambulatory Visit: Payer: Self-pay | Admitting: *Deleted

## 2012-06-23 ENCOUNTER — Telehealth: Payer: Self-pay | Admitting: *Deleted

## 2012-06-23 ENCOUNTER — Encounter (INDEPENDENT_AMBULATORY_CARE_PROVIDER_SITE_OTHER): Payer: 59

## 2012-06-23 DIAGNOSIS — R0989 Other specified symptoms and signs involving the circulatory and respiratory systems: Secondary | ICD-10-CM

## 2012-06-23 DIAGNOSIS — I471 Supraventricular tachycardia: Secondary | ICD-10-CM

## 2012-06-23 MED ORDER — CARVEDILOL 25 MG PO TABS: 25.0000 mg | ORAL_TABLET | Freq: Two times a day (BID) | ORAL | Status: DC

## 2012-06-23 NOTE — Telephone Encounter (Signed)
Pt arrived 06/23/12 for 48 hr holter monitor, but patient advised he was not paying anything out of pocket since his last procedure did not work.  Dr. Rosette Reveal was advised, and spoke with patient.  Dr. Ladona Ridgel decided to change his Rx, and see how that works before proceeding for further testing. TK

## 2012-07-08 ENCOUNTER — Ambulatory Visit: Payer: 59 | Admitting: Internal Medicine

## 2012-08-10 ENCOUNTER — Ambulatory Visit (INDEPENDENT_AMBULATORY_CARE_PROVIDER_SITE_OTHER): Payer: 59 | Admitting: Internal Medicine

## 2012-08-10 ENCOUNTER — Encounter: Payer: Self-pay | Admitting: Internal Medicine

## 2012-08-10 VITALS — BP 184/103 | HR 73 | Ht 73.0 in | Wt 200.8 lb

## 2012-08-10 DIAGNOSIS — I471 Supraventricular tachycardia, unspecified: Secondary | ICD-10-CM

## 2012-08-10 DIAGNOSIS — I498 Other specified cardiac arrhythmias: Secondary | ICD-10-CM

## 2012-08-10 MED ORDER — CARVEDILOL 25 MG PO TABS: 25.0000 mg | ORAL_TABLET | Freq: Two times a day (BID) | ORAL | Status: DC

## 2012-08-10 MED ORDER — CARVEDILOL 25 MG PO TABS: ORAL_TABLET | ORAL | Status: DC

## 2012-08-10 NOTE — Progress Notes (Signed)
HPI Cody Brewer returns today for followup. He is a 62 year old man with a history of SVT, status post catheter ablation. 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 The patient does not have palpitations. In the past, when he went into SVT, he would get chest pressure. Since his ablation, he has had recurrent chest pressure. It is unclear whether this represents recurrent SVT or other symptoms. The patient's blood pressure has been elevated. When I saw the patient several weeks ago, we initiated they'll blocker therapy. Despite taking 25 mg twice a day of carvedilol, his blood pressure continues to be elevated. His wife who is with him today notes that when his chest pain comes on, she will check his blood pressure and heart rate, and notes that his blood pressure is always elevated in his heart rate is usually under 100 beats per minute. No Known Allergies   Current Outpatient Prescriptions  Medication Sig Dispense Refill  . aspirin 81 MG chewable tablet Chew 162 mg by mouth daily.      . carvedilol (COREG) 25 MG tablet Take as directed--2 tablets by mouth twice daily  180 tablet  3  . carvedilol (COREG) 25 MG tablet Take 1 tablet (25 mg total) by mouth 2 (two) times daily.  360 tablet  3  . famotidine (PEPCID) 20 MG tablet Take 1 tablet (20 mg total) by mouth 2 (two) times daily.  60 tablet  1  . Omega-3 Fatty Acids (FISH OIL PO) Take 2 capsules by mouth daily.      . Tamsulosin HCl (FLOMAX) 0.4 MG CAPS Take 0.4 mg by mouth daily.      . valACYclovir (VALTREX) 1000 MG tablet        No current facility-administered medications for this visit.     Past Medical History  Diagnosis Date  . Hypertension   . Sleep disturbance, unspecified   . Anemia     Microcytic   . SVT (supraventricular tachycardia)   . Hypercholesteremia   . Chest pain   . Asthma     "when I was younger" (05/06/2012)  . Exertional dyspnea     "sometimes" (05/06/2012)  . Chronic upper back  pain   . Old myocardial infarct     /H&P 04/09/2012 (05/06/2012)    ROS:   All systems reviewed and negative except as noted in the HPI.   Past Surgical History  Procedure Laterality Date  . Supraventricular tachycardia ablation  05/06/2012  . Excisional hemorrhoidectomy  ~ 2007  . Cardiac catheterization  04/08/2012     No family history on file.   History   Social History  . Marital Status: Married    Spouse Name: N/A    Number of Children: N/A  . Years of Education: N/A   Occupational History  . Not on file.   Social History Main Topics  . Smoking status: Former Smoker -- 4 years    Types: Cigarettes  . Smokeless tobacco: Never Used     Comment: 05/06/2012 "quit in the 1990's; never smoked much"  . Alcohol Use: 1.2 oz/week    2 Cans of beer per week  . Drug Use: Yes    Special: Marijuana     Comment: 05/06/2012 "when I was younger I probably  smoked pot"  . Sexually Active: Not Currently   Other Topics Concern  . Not on file   Social History Narrative  . No narrative on file  BP 184/103  Pulse 73  Ht 6\' 1"  (1.854 m)  Wt 200 lb 12.8 oz (91.082 kg)  BMI 26.5 kg/m2  Physical Exam:  Well appearing middle-aged man,NAD HEENT: Unremarkable Neck:  8 cm JVD, no thyromegally Lungs:  Clear with no wheezes, rales, or rhonchi. HEART:  Regular rate rhythm, no murmurs, no rubs, no clicks, S4 gallop is present Abd:  soft, positive bowel sounds, no organomegally, no rebound, no guarding Ext:  2 plus pulses, no edema, no cyanosis, no clubbing Skin:  No rashes no nodules Neuro:  CN II through XII intact, motor grossly intact  EKG - normal sinus rhythm with left axis deviation and left ventricular hypertrophy. Nonspecific ST-T changes are present  Assess/Plan:

## 2012-08-10 NOTE — Patient Instructions (Addendum)
Your physician recommends that you schedule a follow-up appointment in: 4 weeks with Dr Ladona Ridgel   Your physician has recommended you make the following change in your medication:  1) Increase Carvedilol to 25mg    1 1/2 tablets twice daily for 2 weeks then increase to 2 tablets twice daily  Record your BP and Heart rates and keep log until your next appointment

## 2012-08-10 NOTE — Assessment & Plan Note (Signed)
It is unclear whether or not he is having recurrent SVT. He does not have palpitations. With his blood pressure checks during chest discomfort demonstrating a normal heart rate, I suspect he has not in fact having SVT. We discussed the treatment options, specifically whether or not to undergo cardiac monitoring, or exercise treadmill testing. We also discussed the possibility of up titration of his beta blocker. After reviewing the advantages and disadvantages of all these approaches, I recommended that he up titrate his beta blocker with a goal being that he take 50 mg twice a day after an initial up titration of 37.5 mg twice daily. I'll see the patient back in several weeks. He is encouraged to reduce his salt intake.

## 2012-09-14 ENCOUNTER — Ambulatory Visit: Payer: 59 | Admitting: Internal Medicine

## 2012-09-16 ENCOUNTER — Ambulatory Visit: Payer: 59 | Admitting: Internal Medicine

## 2013-06-21 ENCOUNTER — Other Ambulatory Visit: Payer: Self-pay | Admitting: Internal Medicine

## 2013-07-22 ENCOUNTER — Other Ambulatory Visit: Payer: Self-pay

## 2013-07-22 DIAGNOSIS — I471 Supraventricular tachycardia: Secondary | ICD-10-CM

## 2013-07-22 MED ORDER — CARVEDILOL 25 MG PO TABS: ORAL_TABLET | ORAL | Status: DC

## 2013-09-22 ENCOUNTER — Telehealth: Payer: Self-pay | Admitting: Cardiology

## 2013-09-22 DIAGNOSIS — I2510 Atherosclerotic heart disease of native coronary artery without angina pectoris: Secondary | ICD-10-CM

## 2013-09-22 DIAGNOSIS — R079 Chest pain, unspecified: Secondary | ICD-10-CM

## 2013-09-22 DIAGNOSIS — R0602 Shortness of breath: Secondary | ICD-10-CM

## 2013-09-22 NOTE — Telephone Encounter (Signed)
Dr. Sherrye Payor office called stating pt has had CP and SOB over the last two weeks. They have seen pt and evaluated, but they want to rule out heart problems. Per Dr. Eldridge Dace order exercise myoview.

## 2013-09-22 NOTE — Telephone Encounter (Signed)
Pt notified and instructions given.  

## 2013-09-24 ENCOUNTER — Encounter (HOSPITAL_COMMUNITY): Payer: Self-pay | Admitting: Emergency Medicine

## 2013-09-24 ENCOUNTER — Emergency Department (HOSPITAL_COMMUNITY): Payer: 59

## 2013-09-24 ENCOUNTER — Inpatient Hospital Stay (HOSPITAL_COMMUNITY)
Admission: EM | Admit: 2013-09-24 | Discharge: 2013-09-29 | DRG: 287 | Disposition: A | Discharge status: Home / Self Care | Payer: 59 | Attending: Internal Medicine | Admitting: Internal Medicine

## 2013-09-24 DIAGNOSIS — Z7982 Long term (current) use of aspirin: Secondary | ICD-10-CM

## 2013-09-24 DIAGNOSIS — I2489 Other forms of acute ischemic heart disease: Secondary | ICD-10-CM | POA: Diagnosis present

## 2013-09-24 DIAGNOSIS — R000 Tachycardia, unspecified: Secondary | ICD-10-CM | POA: Diagnosis present

## 2013-09-24 DIAGNOSIS — I252 Old myocardial infarction: Secondary | ICD-10-CM

## 2013-09-24 DIAGNOSIS — R0602 Shortness of breath: Secondary | ICD-10-CM

## 2013-09-24 DIAGNOSIS — E785 Hyperlipidemia, unspecified: Secondary | ICD-10-CM

## 2013-09-24 DIAGNOSIS — I248 Other forms of acute ischemic heart disease: Secondary | ICD-10-CM | POA: Diagnosis present

## 2013-09-24 DIAGNOSIS — I428 Other cardiomyopathies: Secondary | ICD-10-CM

## 2013-09-24 DIAGNOSIS — Z87891 Personal history of nicotine dependence: Secondary | ICD-10-CM

## 2013-09-24 DIAGNOSIS — I509 Heart failure, unspecified: Secondary | ICD-10-CM | POA: Diagnosis present

## 2013-09-24 DIAGNOSIS — R079 Chest pain, unspecified: Secondary | ICD-10-CM

## 2013-09-24 DIAGNOSIS — G4733 Obstructive sleep apnea (adult) (pediatric): Secondary | ICD-10-CM | POA: Diagnosis present

## 2013-09-24 DIAGNOSIS — I10 Essential (primary) hypertension: Secondary | ICD-10-CM

## 2013-09-24 DIAGNOSIS — K5900 Constipation, unspecified: Secondary | ICD-10-CM | POA: Diagnosis not present

## 2013-09-24 DIAGNOSIS — I4891 Unspecified atrial fibrillation: Principal | ICD-10-CM | POA: Diagnosis present

## 2013-09-24 DIAGNOSIS — I5020 Unspecified systolic (congestive) heart failure: Secondary | ICD-10-CM

## 2013-09-24 DIAGNOSIS — E7800 Pure hypercholesterolemia, unspecified: Secondary | ICD-10-CM | POA: Diagnosis present

## 2013-09-24 DIAGNOSIS — I471 Supraventricular tachycardia: Secondary | ICD-10-CM

## 2013-09-24 DIAGNOSIS — I2510 Atherosclerotic heart disease of native coronary artery without angina pectoris: Secondary | ICD-10-CM

## 2013-09-24 HISTORY — DX: Atherosclerotic heart disease of native coronary artery without angina pectoris: I25.10

## 2013-09-24 LAB — PRO B NATRIURETIC PEPTIDE: Pro B Natriuretic peptide (BNP): 2724 pg/mL — ABNORMAL HIGH (ref 0–125)

## 2013-09-24 LAB — CBC
HEMATOCRIT: 41.9 % (ref 39.0–52.0)
HEMOGLOBIN: 13.5 g/dL (ref 13.0–17.0)
MCH: 24 pg — ABNORMAL LOW (ref 26.0–34.0)
MCHC: 32.2 g/dL (ref 30.0–36.0)
MCV: 74.4 fL — AB (ref 78.0–100.0)
Platelets: 348 10*3/uL (ref 150–400)
RBC: 5.63 MIL/uL (ref 4.22–5.81)
RDW: 16 % — ABNORMAL HIGH (ref 11.5–15.5)
WBC: 10.7 10*3/uL — ABNORMAL HIGH (ref 4.0–10.5)

## 2013-09-24 LAB — BASIC METABOLIC PANEL
BUN: 16 mg/dL (ref 6–23)
CO2: 23 mEq/L (ref 19–32)
CREATININE: 0.93 mg/dL (ref 0.50–1.35)
Calcium: 9 mg/dL (ref 8.4–10.5)
Chloride: 102 mEq/L (ref 96–112)
GFR calc Af Amer: >90 mL/min (ref >90)
GFR, EST NON AFRICAN AMERICAN: 88 mL/min — AB (ref >90)
GLUCOSE: 117 mg/dL — AB (ref 70–99)
POTASSIUM: 4.5 meq/L (ref 3.7–5.3)
Sodium: 139 mEq/L (ref 137–147)

## 2013-09-24 LAB — I-STAT TROPONIN, ED: Troponin i, poc: 0 ng/mL (ref 0.00–0.08)

## 2013-09-24 LAB — TSH: TSH: 1.63 u[IU]/mL (ref 0.350–4.500)

## 2013-09-24 MED ORDER — HEPARIN (PORCINE) IN NACL 100-0.45 UNIT/ML-% IJ SOLN
2100.0000 [IU]/h | INTRAMUSCULAR | Status: DC
  Administered 2013-09-24: 1350 [IU]/h via INTRAVENOUS
  Administered 2013-09-25: 1750 [IU]/h via INTRAVENOUS
  Administered 2013-09-26 – 2013-09-27 (×2): 1800 [IU]/h via INTRAVENOUS
  Filled 2013-09-24 (×12): qty 250

## 2013-09-24 MED ORDER — DILTIAZEM HCL 100 MG IV SOLR
5.0000 mg/h | INTRAVENOUS | Status: DC
  Administered 2013-09-24: 5 mg/h via INTRAVENOUS
  Filled 2013-09-24: qty 100

## 2013-09-24 MED ORDER — TAMSULOSIN HCL 0.4 MG PO CAPS
0.4000 mg | ORAL_CAPSULE | Freq: Every day | ORAL | Status: DC
  Administered 2013-09-24 – 2013-09-29 (×6): 0.4 mg via ORAL
  Filled 2013-09-24 (×6): qty 1

## 2013-09-24 MED ORDER — ASPIRIN 81 MG PO CHEW
162.0000 mg | CHEWABLE_TABLET | Freq: Every day | ORAL | Status: DC
  Administered 2013-09-25 – 2013-09-29 (×5): 162 mg via ORAL
  Filled 2013-09-24 (×6): qty 2

## 2013-09-24 MED ORDER — DILTIAZEM HCL 100 MG IV SOLR
5.0000 mg/h | INTRAVENOUS | Status: DC
  Administered 2013-09-25 (×3): 15 mg/h via INTRAVENOUS
  Filled 2013-09-24 (×2): qty 100

## 2013-09-24 MED ORDER — DILTIAZEM HCL 25 MG/5ML IV SOLN
20.0000 mg | Freq: Once | INTRAVENOUS | Status: AC
  Administered 2013-09-24: 20 mg via INTRAVENOUS
  Filled 2013-09-24: qty 5

## 2013-09-24 MED ORDER — CARVEDILOL 25 MG PO TABS
25.0000 mg | ORAL_TABLET | Freq: Two times a day (BID) | ORAL | Status: DC
  Administered 2013-09-25 – 2013-09-29 (×10): 25 mg via ORAL
  Filled 2013-09-24 (×11): qty 1

## 2013-09-24 MED ORDER — FAMOTIDINE 20 MG PO TABS
20.0000 mg | ORAL_TABLET | Freq: Two times a day (BID) | ORAL | Status: DC
  Administered 2013-09-24 – 2013-09-29 (×9): 20 mg via ORAL
  Filled 2013-09-24 (×11): qty 1

## 2013-09-24 MED ORDER — ASPIRIN 325 MG PO TABS
325.0000 mg | ORAL_TABLET | Freq: Once | ORAL | Status: AC
  Administered 2013-09-24: 325 mg via ORAL
  Filled 2013-09-24: qty 1

## 2013-09-24 MED ORDER — NITROGLYCERIN 0.4 MG SL SUBL
0.4000 mg | SUBLINGUAL_TABLET | SUBLINGUAL | Status: AC | PRN
  Administered 2013-09-24 (×3): 0.4 mg via SUBLINGUAL
  Filled 2013-09-24: qty 1

## 2013-09-24 MED ORDER — HEPARIN BOLUS VIA INFUSION
4000.0000 [IU] | Freq: Once | INTRAVENOUS | Status: AC
  Administered 2013-09-24: 4000 [IU] via INTRAVENOUS
  Filled 2013-09-24: qty 4000

## 2013-09-24 NOTE — H&P (Signed)
Patient ID: LUDY LITHERLAND MRN: [id number], DOB/AGE: March 29, 1951   Admit date: 09/24/2013   Primary Physician:  Duane Lope, MD Primary Cardiologist: [name], MD ([facility])  Pt. Profile:  12M with HTN, AVNRT s/p ablation, chest pain, prior MI who presents with new onset AF.   Problem List  Past Medical History  Diagnosis Date  . Hypertension   . Sleep disturbance, unspecified   . Anemia     Microcytic   . SVT (supraventricular tachycardia)   . Hypercholesteremia   . Chest pain   . Asthma     "when I was younger" (05/06/2012)  . Exertional dyspnea     "sometimes" (05/06/2012)  . Chronic upper back pain   . Old myocardial infarct     /H&P 04/09/2012 (05/06/2012)  . Coronary artery disease     Past Surgical History  Procedure Laterality Date  . Supraventricular tachycardia ablation  05/06/2012  . Excisional hemorrhoidectomy  ~ 2007  . Cardiac catheterization  04/08/2012     Allergies  No Known Allergies  HPI  12M with HTN, AVNRT s/p ablation, chest pain, prior MI who presents with new onset AF. Mr. Pera reports 5-7 days of lower/chest epigastric discomfort. Pain has been constant and has been preventing sleep. Also notes profound fatigue and occasional palpitations.  Denies syncope or pre-syncope. Notes occasional LE edema. These symptoms were reported to Dr. Eldridge Dace who recommended exercise myoview this week. Due to intolerable symptoms, Mr. Kentner came today for evaluation.   In ED, he was noticed to be in AF with RVR with rates to 140s and he was started on diltiazem drip. Of note, Mr. Fronheiser had aSVT ablation on 04/26/12. Symptoms of SVT included chest discomfort.   Home Medications  Prior to Admission medications   Medication Sig Start Date End Date Taking? Authorizing Provider  aspirin 81 MG chewable tablet Chew 162 mg by mouth daily.   Yes Historical Provider, MD  carvedilol (COREG) 25 MG tablet Take 2 tablets by mouth twice a day 07/22/13   Yes Marinus Maw, MD  famotidine (PEPCID) 20 MG tablet Take 1 tablet (20 mg total) by mouth 2 (two) times daily. 04/09/12  Yes Russella Dar, NP  Tamsulosin HCl (FLOMAX) 0.4 MG CAPS Take 0.4 mg by mouth daily.   Yes Historical Provider, MD    Family History  No family history on file.  Social History  History   Social History  . Marital Status: Married    Spouse Name: N/A    Number of Children: N/A  . Years of Education: N/A   Occupational History  . Not on file.   Social History Main Topics  . Smoking status: Former Smoker -- 4 years    Types: Cigarettes  . Smokeless tobacco: Never Used     Comment: 05/06/2012 "quit in the 1990's; never smoked much"  . Alcohol Use: 1.2 oz/week    2 Cans of beer per week  . Drug Use: Yes    Special: Marijuana     Comment: 05/06/2012 "when I was younger I probably  smoked pot"  . Sexual Activity: Not Currently   Other Topics Concern  . Not on file   Social History Narrative  . No narrative on file     Review of Systems General:  No chills, fever, night sweats or weight changes. + fatigue. Cardiovascular:  + chest pain,  - dyspnea on exertion, occasional edema, - orthopnea, occasional palpitations,  Dermatological: No rash, lesions/masses Respiratory:  No cough, dyspnea Urologic: No hematuria, dysuria Abdominal:   No nausea, vomiting, diarrhea, bright red blood per rectum, melena, or hematemesis Neurologic:  No visual changes, wkns, changes in mental status. All other systems reviewed and are otherwise negative except as noted above.  Physical Exam  Blood pressure 143/93, pulse 67, temperature 97.9 F (36.6 C), temperature source Oral, resp. rate 30, height 6\' 1"  (1.854 m), weight 91.173 kg (201 lb), SpO2 98.00%.  General: Pleasant, NAD Psych: Normal affect. Neuro: Alert and oriented X 3. Moves all extremities spontaneously. HEENT: Normal  Neck: Supple without bruits or JVD. Lungs:  Resp regular and unlabored,  CTA. Heart: Irregularly irregular. no s3, s4, or murmurs. Abdomen: Soft, non-tender, non-distended Extremities: No clubbing, cyanosis or edema. DP/PT/Radials 2+ and equal bilaterally.  Labs  Troponin [facility](Point of Care Test)  Recent Labs  09/24/13 1540  TROPIPOC 0.00   No results found for this basename: CKTOTAL, CKMB, TROPONINI,  in the last 72 hours Lab Results  Component Value Date   WBC 10.7* 09/24/2013   HGB 13.5 09/24/2013   HCT 41.9 09/24/2013   MCV 74.4* 09/24/2013   PLT 348 09/24/2013    Recent Labs Lab 09/24/13 1531  NA 139  K 4.5  CL 102  CO2 23  BUN 16  CREATININE 0.93  CALCIUM 9.0  GLUCOSE 117*   Lab Results  Component Value Date   CHOL 178 04/08/2012   HDL 71 04/08/2012   LDLCALC 96 04/08/2012   TRIG 56 04/08/2012   No results found for this basename: DDIMER     Radiology/Studies  Dg Chest Portable 1 View  09/24/2013   CLINICAL DATA:  CHEST PAIN CHEST PAIN  EXAM: PORTABLE CHEST - 1 VIEW  COMPARISON:  DG CHEST 2 VIEW dated 04/07/2012  FINDINGS: The cardiac silhouette is enlarged. Low lung volumes. Mild prominence of the interstitial markings. No focal regions of consolidation or focal infiltrates. No acute osseous abnormalities.  IMPRESSION: Pulmonary vascular congestion.  Cardiomegaly   Electronically Signed   By: Salome HolmesHector  Cooper M.D.   On: 09/24/2013 16:13    04/08/12 TTE - Left ventricle: The cavity size was normal. There was mild focal basal hypertrophy of the septum. Systolic function was normal. The estimated ejection fraction was in the range of 55% to 60%. Wall motion was normal; there were no regional wall motion abnormalities. - Mitral valve: Mild regurgitation. - Atrial septum: No defect or patent foramen ovale was identified.  04/07/12 Cardiac Cath Hemodynamic Findings:  Central aortic pressure: 117/80  Left ventricular pressure: 134/12/26  Angiographic Findings:  Left main: No evidence of disease.  Left Anterior Descending Artery: Large  caliber vessel that courses to the apex. There are mild luminal irregularities in the mid vessel. The diagonal branch is moderate sized with 10% proximal stenosis.  Circumflex Artery: Large caliber vessel with a large obtuse marginal branch. No obstructive disease noted.  Right Coronary Artery: Large, dominant vessel with no disease noted.  Left Ventricular Angiogram: LVEF 50% with hypokinesis of the anterior and inferior bases.  Impression:  1. Mild non-obstructive CAD  2. Segmental wall motion abnormality of the LV  3. Elevated troponin likely related to demand ischemia in setting of SVT with rapid ventricular rates.   ECG AF with RVR. IVCD with LAD. Lateral ST depressions. Rate 140s  ASSESSMENT AND PLAN 25M with HTN, AVNRT s/p ablation, chest pain, prior MI who presents with new onset AF.   AF with RVR. CHADSVASC = 1 for HTN - start  heparin drip for now; may not need to pursue long term AC given CHADSVASC score of 1 (assuming EF remains normal). Would at least need short term AC if TEE DCCV is required - continue home coreg - TSH - TTE - continue diltiazem drip  Chest pain. Fairly low risk given minimal risk factors and essentially normal cath in Nov 2013.  - cycle cardiac biomarkers - consider stress test if biomarkers are negative; if positive, consider cath  Signed, Glori Luis, MD  09/24/2013, 5:54 PM

## 2013-09-24 NOTE — ED Notes (Signed)
Patient with reported chest pain for 4 to 5 days.  He has known hx of cardiac disease.  Patient states he is to have a stress test.  Patient with noted tachycardia in triage.  He reports he has tingling al over.  Denies diziness.  Patient did take aspirin 162mg .

## 2013-09-24 NOTE — ED Notes (Signed)
Attempted to call report to floor 

## 2013-09-24 NOTE — ED Provider Notes (Signed)
CSN: [id number]     Arrival date & time 09/24/13  1518 History   First MD Initiated Contact with Patient 09/24/13 1533     Chief Complaint  Patient presents with  . Chest Pain     (Consider location/radiation/quality/duration/timing/severity/associated sxs/prior Treatment) Patient is a 63 y.o. male presenting with chest pain. The history is provided by the patient.  Chest Pain Pain location:  Substernal area Pain quality: tightness   Pain radiates to:  Does not radiate Pain radiates to the back: no   Pain severity:  Moderate Onset quality:  Gradual Duration:  7 days Timing:  Constant Progression:  Unchanged Chronicity:  New Context: not at rest, no stress and no trauma   Relieved by:  Nothing Worsened by:  Nothing tried Associated symptoms: palpitations and shortness of breath   Associated symptoms: no abdominal pain, no cough, no fever, no syncope and not vomiting     Past Medical History  Diagnosis Date  . Hypertension   . Sleep disturbance, unspecified   . Anemia     Microcytic   . SVT (supraventricular tachycardia)   . Hypercholesteremia   . Chest pain   . Asthma     "when I was younger" (05/06/2012)  . Exertional dyspnea     "sometimes" (05/06/2012)  . Chronic upper back pain   . Old myocardial infarct     /H&P 04/09/2012 (05/06/2012)  . Coronary artery disease    Past Surgical History  Procedure Laterality Date  . Supraventricular tachycardia ablation  05/06/2012  . Excisional hemorrhoidectomy  ~ 2007  . Cardiac catheterization  04/08/2012   No family history on file. History  Substance Use Topics  . Smoking status: Former Smoker -- 4 years    Types: Cigarettes  . Smokeless tobacco: Never Used     Comment: 05/06/2012 "quit in the 1990's; never smoked much"  . Alcohol Use: 1.2 oz/week    2 Cans of beer per week    Review of Systems  Constitutional: Negative for fever and chills.  Respiratory: Positive for shortness of breath. Negative for cough.    Cardiovascular: Positive for chest pain and palpitations. Negative for syncope.  Gastrointestinal: Negative for vomiting and abdominal pain.  All other systems reviewed and are negative.     Allergies  Review of patient's allergies indicates no known allergies.  Home Medications   Prior to Admission medications   Medication Sig Start Date End Date Taking? Authorizing Provider  aspirin 81 MG chewable tablet Chew 162 mg by mouth daily.    Historical Provider, MD  carvedilol (COREG) 25 MG tablet Take 2 tablets by mouth twice a day 07/22/13   Marinus Maw, MD  famotidine (PEPCID) 20 MG tablet Take 1 tablet (20 mg total) by mouth 2 (two) times daily. 04/09/12   Russella Dar, NP  Omega-3 Fatty Acids (FISH OIL PO) Take 2 capsules by mouth daily.    Historical Provider, MD  Tamsulosin HCl (FLOMAX) 0.4 MG CAPS Take 0.4 mg by mouth daily.    Historical Provider, MD  valACYclovir (VALTREX) 1000 MG tablet  07/22/12   Historical Provider, MD   BP 156/118  Pulse 164  Temp(Src) 97.9 F (36.6 C) (Oral)  Resp 22  Ht 6\' 1"  (1.854 m)  Wt 201 lb (91.173 kg)  BMI 26.52 kg/m2  SpO2 97% Physical Exam  Nursing note and vitals reviewed. Constitutional: He is oriented to person, place, and time. He appears well-developed and well-nourished. No distress.  HENT:  Head:  Normocephalic and atraumatic.  Mouth/Throat: No oropharyngeal exudate.  Eyes: EOM are normal. Pupils are equal, round, and reactive to light.  Neck: Normal range of motion. Neck supple.  Cardiovascular: An irregularly irregular rhythm present. Tachycardia present.  Exam reveals no friction rub.   No murmur heard. Pulmonary/Chest: Effort normal and breath sounds normal. No respiratory distress. He has no wheezes. He has no rales.  Abdominal: Soft. He exhibits no distension. There is no tenderness. There is no rebound.  Musculoskeletal: Normal range of motion. He exhibits no edema.  Neurological: He is alert and oriented to person,  place, and time.  Skin: No rash noted. He is not diaphoretic.    ED Course  Procedures (including critical care time) Labs Review Labs Reviewed  CBC  BASIC METABOLIC PANEL  I-STAT TROPOININ, ED    Imaging Review Dg Chest Portable 1 View  09/24/2013   CLINICAL DATA:  CHEST PAIN CHEST PAIN  EXAM: PORTABLE CHEST - 1 VIEW  COMPARISON:  DG CHEST 2 VIEW dated 04/07/2012  FINDINGS: The cardiac silhouette is enlarged. Low lung volumes. Mild prominence of the interstitial markings. No focal regions of consolidation or focal infiltrates. No acute osseous abnormalities.  IMPRESSION: Pulmonary vascular congestion.  Cardiomegaly   Electronically Signed   By: Salome HolmesHector  Cooper M.D.   On: 09/24/2013 16:13     EKG Interpretation   Date/Time:  Saturday Sep 24 2013 15:23:19 EDT Ventricular Rate:  143 PR Interval:    QRS Duration: 134 QT Interval:  308 QTC Calculation: 475 R Axis:   -40 Text Interpretation:  Atrial fibrillation with rapid ventricular response  with premature ventricular or aberrantly conducted complexes Left axis  deviation Left ventricular hypertrophy with QRS widening Cannot rule out  Septal infarct , age undetermined T wave abnormality, consider lateral  ischemia or digitalis effect Abnormal ECG Similar morphology before - was  SVT Confirmed by Gwendolyn GrantWALDEN  MD, Gerber Penza ([id number]) on 09/24/2013 3:34:17 PM      CRITICAL CARE Performed by: Dagmar HaitWilliam Elex Mainwaring   Total critical care time: 30 minutes  Critical care time was exclusive of separately billable procedures and treating other patients.  Critical care was necessary to treat or prevent imminent or life-threatening deterioration.  Critical care was time spent personally by me on the following activities: development of treatment plan with patient and/or surrogate as well as nursing, discussions with consultants, evaluation of patient's response to treatment, examination of patient, obtaining history from patient or surrogate,  ordering and performing treatments and interventions, ordering and review of laboratory studies, ordering and review of radiographic studies, pulse oximetry and re-evaluation of patient's condition.  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 Present for past week, getting worse. At the end of March, he saw Dr. Ladona Ridgelaylor to increased his beta blocker to help with intermittent chest pain. He was stated is not having palpitations at that time in an office note. Here, he is tachycardic with irregularly irregular rhythm consistent with A. fib with RVR. He has normal blood pressure. He is not currently on any anticoagulation and I cannot find history of atrial fibrillation in his notes. Since this has been prolonged for the past 7 days, will start with diltiazem infusion and admitted to cardiology. On reexam, patient's heart rate is much improved as are his symptoms of chest pain shortness  of breath.   Dagmar Hait, MD 09/24/13 2239

## 2013-09-24 NOTE — ED Notes (Addendum)
Called kitchen to order tray for the patient to receive in new room assignment.

## 2013-09-24 NOTE — Progress Notes (Signed)
ANTICOAGULATION CONSULT NOTE - Initial Consult  Pharmacy Consult for Heparin Indication: atrial fibrillation  No Known Allergies  Patient Measurements: Height: 6\' 1"  (185.4 cm) Weight: 201 lb (91.173 kg) IBW/kg (Calculated) : 79.9  Vital Signs: Temp: 97.9 F (36.6 C) (05/09 1530) Temp src: Oral (05/09 1530) BP: 157/109 mmHg (05/09 1900) Pulse Rate: 68 (05/09 1900)  Labs:  Recent Labs  09/24/13 1531  HGB 13.5  HCT 41.9  PLT 348  CREATININE 0.93    Estimated Creatinine Clearance: 93.1 ml/min (by C-G formula based on Cr of 0.93).   Medical History: Past Medical History  Diagnosis Date  . Hypertension   . Sleep disturbance, unspecified   . Anemia     Microcytic   . SVT (supraventricular tachycardia)   . Hypercholesteremia   . Chest pain   . Asthma     "when I was younger" (05/06/2012)  . Exertional dyspnea     "sometimes" (05/06/2012)  . Chronic upper back pain   . Old myocardial infarct     /H&P 04/09/2012 (05/06/2012)  . Coronary artery disease     Assessment: 63 year old male to begin iv heparin for Afib Renal function is stable  Goal of Therapy:  Heparin level 0.3-0.7 units/ml Monitor platelets by anticoagulation protocol: Yes   Plan:  1) Heparin 4000 units iv x 1 2) Heparin drip at 1350 units / hr 3) Heparin level and CBC daily  Thank you. Okey Regal, PharmD [phone]  Elwin Sleight 09/24/2013,7:27 PM

## 2013-09-25 DIAGNOSIS — E785 Hyperlipidemia, unspecified: Secondary | ICD-10-CM

## 2013-09-25 DIAGNOSIS — R0602 Shortness of breath: Secondary | ICD-10-CM

## 2013-09-25 DIAGNOSIS — I471 Supraventricular tachycardia: Secondary | ICD-10-CM

## 2013-09-25 DIAGNOSIS — I10 Essential (primary) hypertension: Secondary | ICD-10-CM

## 2013-09-25 LAB — BASIC METABOLIC PANEL
BUN: 16 mg/dL (ref 6–23)
CO2: 21 meq/L (ref 19–32)
Calcium: 8.3 mg/dL — ABNORMAL LOW (ref 8.4–10.5)
Chloride: 105 mEq/L (ref 96–112)
Creatinine, Ser: 0.87 mg/dL (ref 0.50–1.35)
GFR calc Af Amer: >90 mL/min (ref >90)
GFR calc non Af Amer: >90 mL/min (ref >90)
GLUCOSE: 101 mg/dL — AB (ref 70–99)
Potassium: 4.4 mEq/L (ref 3.7–5.3)
SODIUM: 141 meq/L (ref 137–147)

## 2013-09-25 LAB — LIPID PANEL
CHOL/HDL RATIO: 3.7 ratio
Cholesterol: 141 mg/dL (ref 0–200)
HDL: 38 mg/dL — AB (ref >39)
LDL Cholesterol: 88 mg/dL (ref 0–99)
Triglycerides: 75 mg/dL (ref <150)
VLDL: 15 mg/dL (ref 0–40)

## 2013-09-25 LAB — HEPARIN LEVEL (UNFRACTIONATED)
Heparin Unfractionated: 0.1 IU/mL — ABNORMAL LOW (ref 0.30–0.70)
Heparin Unfractionated: 0.31 IU/mL (ref 0.30–0.70)
Heparin Unfractionated: 0.38 IU/mL (ref 0.30–0.70)

## 2013-09-25 LAB — CBC
HCT: 39.7 % (ref 39.0–52.0)
HEMOGLOBIN: 12.5 g/dL — AB (ref 13.0–17.0)
MCH: 23.6 pg — AB (ref 26.0–34.0)
MCHC: 31.5 g/dL (ref 30.0–36.0)
MCV: 74.9 fL — AB (ref 78.0–100.0)
Platelets: 267 10*3/uL (ref 150–400)
RBC: 5.3 MIL/uL (ref 4.22–5.81)
RDW: 16.2 % — ABNORMAL HIGH (ref 11.5–15.5)
WBC: 9.4 10*3/uL (ref 4.0–10.5)

## 2013-09-25 LAB — T4, FREE: Free T4: 1.24 ng/dL (ref 0.80–1.80)

## 2013-09-25 LAB — TROPONIN I
Troponin I: <0.3 ng/mL (ref <0.30)
Troponin I: <0.3 ng/mL (ref <0.30)

## 2013-09-25 LAB — PROTIME-INR
INR: 1.22 (ref 0.00–1.49)
PROTHROMBIN TIME: 15.1 s (ref 11.6–15.2)

## 2013-09-25 MED ORDER — HEPARIN BOLUS VIA INFUSION
3000.0000 [IU] | Freq: Once | INTRAVENOUS | Status: AC
  Administered 2013-09-25: 3000 [IU] via INTRAVENOUS
  Filled 2013-09-25: qty 3000

## 2013-09-25 MED ORDER — OFF THE BEAT BOOK
Freq: Once | Status: AC
  Administered 2013-09-25: 06:00:00
  Filled 2013-09-25: qty 1

## 2013-09-25 MED ORDER — DILTIAZEM HCL 60 MG PO TABS
60.0000 mg | ORAL_TABLET | Freq: Three times a day (TID) | ORAL | Status: DC
  Administered 2013-09-25 – 2013-09-29 (×10): 60 mg via ORAL
  Filled 2013-09-25 (×15): qty 1

## 2013-09-25 NOTE — Progress Notes (Signed)
ANTICOAGULATION CONSULT NOTE - Follow Up Consult  Pharmacy Consult for heparin Indication: atrial fibrillation  Labs:  Recent Labs  09/24/13 1531 09/25/13 0417  HGB 13.5 12.5*  HCT 41.9 39.7  PLT 348 267  LABPROT  --  15.1  INR  --  1.22  HEPARINUNFRC  --  0.10*  CREATININE 0.93  --     Assessment: 63yo male subtherapeutic on heparin with initial dosing for Afib.  Goal of Therapy:  Heparin level 0.3-0.7 units/ml   Plan:  Will rebolus with heparin 3000 units and increase gtt by 4 units/kg/hr to 1750 units/hr and check level in 6hr.  Vernard Gambles, PharmD, BCPS  09/25/2013,5:44 AM

## 2013-09-25 NOTE — Progress Notes (Signed)
ANTICOAGULATION CONSULT NOTE - Follow Up Consult  Pharmacy Consult for heparin Indication: atrial fibrillation  Labs:  Recent Labs  09/24/13 1531 09/25/13 0417 09/25/13 1148  HGB 13.5 12.5*  --   HCT 41.9 39.7  --   PLT 348 267  --   LABPROT  --  15.1  --   INR  --  1.22  --   HEPARINUNFRC  --  0.10* 0.31  CREATININE 0.93 0.87  --     Assessment: 63yo male now therapeutic on heparin at 1750 units/hr but on lower end of goal range. Hgb slight trend down to 12.5. Plt wnl   Goal of Therapy:  Heparin level 0.3-0.7 units/ml   Plan:  1) Increase heparin infusion to 1800 units/hr  2) F/u 6-hr HL 3) Monitor daily HL, CBC and s/s of bleeding   Vinnie Level, PharmD.  Clinical Pharmacist Pager [phone]

## 2013-09-25 NOTE — Progress Notes (Signed)
ANTICOAGULATION CONSULT NOTE - Follow Up Consult  Pharmacy Consult for heparin Indication: atrial fibrillation  No Known Allergies  Patient Measurements: Height: 6\' 1"  (185.4 cm) Weight: 199 lb 9.6 oz (90.538 kg) IBW/kg (Calculated) : 79.9 Heparin Dosing Weight:   Vital Signs: Temp: 98.2 F (36.8 C) (05/10 1400) Temp src: Oral (05/10 1400) BP: 130/80 mmHg (05/10 1402) Pulse Rate: 88 (05/10 1402)  Labs:  Recent Labs  09/24/13 1531 09/25/13 0417 09/25/13 1148 09/25/13 1415 09/25/13 2030  HGB 13.5 12.5*  --   --   --   HCT 41.9 39.7  --   --   --   PLT 348 267  --   --   --   LABPROT  --  15.1  --   --   --   INR  --  1.22  --   --   --   HEPARINUNFRC  --  0.10* 0.31  --  0.38  CREATININE 0.93 0.87  --   --   --   TROPONINI  --   --   --  <0.30  --     Estimated Creatinine Clearance: 99.5 ml/min (by C-G formula based on Cr of 0.87).   Medications:  Scheduled:  . aspirin  162 mg Oral Daily  . carvedilol  25 mg Oral BID WC  . diltiazem  60 mg Oral 3 times per day  . famotidine  20 mg Oral BID  . tamsulosin  0.4 mg Oral Daily   Infusions:  . diltiazem (CARDIZEM) infusion Stopped (09/25/13 1716)  . heparin 1,800 Units/hr (09/25/13 1400)    Assessment: 63 yo male with afib is currently on therapeutic heparin.  Heparin level is 0.38 Goal of Therapy:  Heparin level 0.3-0.7 units/ml Monitor platelets by anticoagulation protocol: Yes   Plan:  1) Continue heparin at 1800 units/hr 2) Daily heparin level and CBC  Tsz-Yin Fallon Haecker 09/25/2013,9:35 PM

## 2013-09-25 NOTE — Progress Notes (Signed)
SUBJECTIVE: Pt admitted with symptomatic rapid atrial fibrillation. Now on diltiazem drip and denies chest pain. Does feel somewhat fatigued, but better than last night.     Intake/Output Summary (Last 24 hours) at 09/25/13 1256 Last data filed at 09/25/13 0841  Gross per 24 hour  Intake    360 ml  Output    300 ml  Net     60 ml    Current Facility-Administered Medications  Medication Dose Route Frequency Provider Last Rate Last Dose  . aspirin chewable tablet 162 mg  162 mg Oral Daily Glori Luis, MD   162 mg at 09/25/13 0916  . carvedilol (COREG) tablet 25 mg  25 mg Oral BID WC Glori Luis, MD   25 mg at 09/25/13 [phone]  . diltiazem (CARDIZEM) 100 mg in dextrose 5 % 100 mL infusion  5-15 mg/hr Intravenous Titrated Dagmar Hait, MD 10 mL/hr at 09/24/13 1929 10 mg/hr at 09/24/13 1929  . diltiazem (CARDIZEM) 100 mg in dextrose 5 % 100 mL infusion  5-15 mg/hr Intravenous Titrated Glori Luis, MD 15 mL/hr at 09/25/13 0646 15 mg/hr at 09/25/13 0646  . famotidine (PEPCID) tablet 20 mg  20 mg Oral BID Glori Luis, MD   20 mg at 09/25/13 0916  . heparin ADULT infusion 100 units/mL (25000 units/250 mL)  1,750 Units/hr Intravenous Continuous Colleen Can, RPH 17.5 mL/hr at 09/25/13 0647 1,750 Units/hr at 09/25/13 0647  . tamsulosin (FLOMAX) capsule 0.4 mg  0.4 mg Oral Daily Glori Luis, MD   0.4 mg at 09/25/13 0916    Filed Vitals:   09/24/13 1945 09/24/13 2034 09/24/13 2330 09/25/13 0500  BP: 160/109 163/117 157/109 159/102  Pulse: 94 104  88  Temp:  98.6 F (37 C)  98.8 F (37.1 C)  TempSrc:  Oral  Oral  Resp: 26 22  20   Height:  6\' 1"  (1.854 m)    Weight:  206 lb 9.6 oz (93.713 kg)  199 lb 9.6 oz (90.538 kg)  SpO2: 99%   96%    PHYSICAL EXAM General: NAD Neck: No JVD, no thyromegaly.  Lungs: Clear to auscultation bilaterally with normal respiratory effort. CV: Nondisplaced PMI.  Irregular rhythm, normal rate, normal S1/S2, no S3, no  murmur.  No pretibial edema.  No carotid bruit.  Normal pedal pulses.  Abdomen: Soft, nontender, no hepatosplenomegaly, no distention.  Neurologic: Alert and oriented x 3.  Psych: Normal affect. Extremities: No clubbing or cyanosis.   TELEMETRY: Reviewed telemetry pt in controlled atrial fibrillation, HR 80's.  LABS: Basic Metabolic Panel:  Recent Labs  03/88/82 1531 09/25/13 0417  NA 139 141  K 4.5 4.4  CL 102 105  CO2 23 21  GLUCOSE 117* 101*  BUN 16 16  CREATININE 0.93 0.87  CALCIUM 9.0 8.3*   Liver Function Tests: No results found for this basename: AST, ALT, ALKPHOS, BILITOT, PROT, ALBUMIN,  in the last 72 hours No results found for this basename: LIPASE, AMYLASE,  in the last 72 hours CBC:  Recent Labs  09/24/13 1531 09/25/13 0417  WBC 10.7* 9.4  HGB 13.5 12.5*  HCT 41.9 39.7  MCV 74.4* 74.9*  PLT 348 267   Cardiac Enzymes: No results found for this basename: CKTOTAL, CKMB, CKMBINDEX, TROPONINI,  in the last 72 hours BNP: No components found with this basename: POCBNP,  D-Dimer: No results found for this basename: DDIMER,  in the last 72 hours Hemoglobin A1C: No results found for this  basename: HGBA1C,  in the last 72 hours Fasting Lipid Panel:  Recent Labs  09/25/13 0417  CHOL 141  HDL 38*  LDLCALC 88  TRIG 75  CHOLHDL 3.7   Thyroid Function Tests:  Recent Labs  09/24/13 2130  TSH 1.630   Anemia Panel: No results found for this basename: VITAMINB12, FOLATE, FERRITIN, TIBC, IRON, RETICCTPCT,  in the last 72 hours  RADIOLOGY: Dg Chest Portable 1 View  09/24/2013   CLINICAL DATA:  CHEST PAIN CHEST PAIN  EXAM: PORTABLE CHEST - 1 VIEW  COMPARISON:  DG CHEST 2 VIEW dated 04/07/2012  FINDINGS: The cardiac silhouette is enlarged. Low lung volumes. Mild prominence of the interstitial markings. No focal regions of consolidation or focal infiltrates. No acute osseous abnormalities.  IMPRESSION: Pulmonary vascular congestion.  Cardiomegaly    Electronically Signed   By: Salome HolmesHector  Cooper M.D.   On: 09/24/2013 16:13      ASSESSMENT AND PLAN: 1. Atrial fibrillation with RVR: HR presently controlled on diltiazem drip. Will switch to oral diltiazem and wean off drip. BP elevated and diltiazem may help to reduce this as well. Continue Coreg at present dose. TSH normal. CHADSVASC score is 1 (HTN), thus anticoagulation not currently indicated. Will await echo results. 2. HTN: Oral diltiazem for rate control may also help to normalize BP. Continue Coreg at present dose. Will monitor. 3. Chest pain: Resolved with HR control. Nonobstructive CAD by cath in 03/2012, with elevated troponins at that time deemed to be secondary to demand ischemia related to rapid SVT. Echo in 03/2012 demonstrated normal LV systolic function and regional wall motion. Troponins ordered but not drawn. Will have lab contacted. Will f/u echo results.    Prentice DockerSuresh Koneswaran, M.D., F.A.C.C.

## 2013-09-26 ENCOUNTER — Encounter (HOSPITAL_COMMUNITY): Payer: Self-pay | Admitting: Physician Assistant

## 2013-09-26 DIAGNOSIS — K5900 Constipation, unspecified: Secondary | ICD-10-CM

## 2013-09-26 LAB — CBC
HEMATOCRIT: 36.2 % — AB (ref 39.0–52.0)
Hemoglobin: 11.3 g/dL — ABNORMAL LOW (ref 13.0–17.0)
MCH: 23.4 pg — ABNORMAL LOW (ref 26.0–34.0)
MCHC: 31.2 g/dL (ref 30.0–36.0)
MCV: 74.9 fL — AB (ref 78.0–100.0)
Platelets: 281 10*3/uL (ref 150–400)
RBC: 4.83 MIL/uL (ref 4.22–5.81)
RDW: 16.3 % — ABNORMAL HIGH (ref 11.5–15.5)
WBC: 8.6 10*3/uL (ref 4.0–10.5)

## 2013-09-26 LAB — HEPARIN LEVEL (UNFRACTIONATED): Heparin Unfractionated: 0.4 IU/mL (ref 0.30–0.70)

## 2013-09-26 LAB — TROPONIN I: Troponin I: <0.3 ng/mL (ref <0.30)

## 2013-09-26 MED ORDER — SODIUM CHLORIDE 0.9 % IV SOLN: INTRAVENOUS | Status: DC

## 2013-09-26 MED ORDER — SODIUM CHLORIDE 0.9 % IJ SOLN
3.0000 mL | Freq: Two times a day (BID) | INTRAMUSCULAR | Status: DC
  Administered 2013-09-26 – 2013-09-27 (×3): 3 mL via INTRAVENOUS

## 2013-09-26 MED ORDER — POLYETHYLENE GLYCOL 3350 17 G PO PACK
17.0000 g | PACK | Freq: Every day | ORAL | Status: DC
  Administered 2013-09-26 – 2013-09-29 (×4): 17 g via ORAL
  Filled 2013-09-26 (×5): qty 1

## 2013-09-26 MED ORDER — SODIUM CHLORIDE 0.9 % IV SOLN
250.0000 mL | INTRAVENOUS | Status: DC
  Administered 2013-09-27 (×2): via INTRAVENOUS

## 2013-09-26 MED ORDER — SODIUM CHLORIDE 0.9 % IJ SOLN: 3.0000 mL | INTRAMUSCULAR | Status: DC | PRN

## 2013-09-26 NOTE — Care Management Note (Addendum)
    Page 1 of 1   09/29/2013     3:41:00 PM CARE MANAGEMENT NOTE 09/29/2013  Patient:  Cody Brewer, Cody Brewer   Account Number:  [id number]  Date Initiated:  09/26/2013  Documentation initiated by:  GRAVES-BIGELOW,Beena Catano  Subjective/Objective Assessment:   Pt admitted for new onset afib initiated on cardizem gtt. Plan for cardioversion.     Action/Plan:   Plan for home when stable.   Anticipated DC Date:  09/28/2013   Anticipated DC Plan:  HOME/SELF CARE      DC Planning Services  CM consult      Choice offered to / List presented to:             Status of service:  Completed, signed off Medicare Important Message given?   (If response is "NO", the following Medicare IM given date fields will be blank) Date Medicare IM given:   Date Additional Medicare IM given:    Discharge Disposition:  HOME/SELF CARE  Per UR Regulation:  Reviewed for med. necessity/level of care/duration of stay  If discussed at Long Length of Stay Meetings, dates discussed:   09/29/2013    Comments:   1533  09-29-13 co pay for eliquis will be 18.00. [facility] [address] has medication available.  09-29-13 [address], [place] [phone] CM did a benefits check- still in process for eliquis. CM will provide pt with 30 day free and co pay cards. Pt will need Rx for 30 day free no refills and the orignal Rx with refills. No further needs from CM at this time.   Pt s/p TEE cadioversion-successful. Post cath 09-28-13 Plan for possible d/c 09-29-13. CM will continue to monitor. Tomi Bamberger, RN,BSN [phone]

## 2013-09-26 NOTE — Progress Notes (Addendum)
Patient Name: Cody Brewer Date of Encounter: 09/26/2013     Active Problems:   Atrial fibrillation    SUBJECTIVE  He did not sleep well last night due to palpitations. Worse with exertion. He does report mild orthopnea and hx of PND.  CURRENT MEDS . aspirin  162 mg Oral Daily  . carvedilol  25 mg Oral BID WC  . diltiazem  60 mg Oral 3 times per day  . famotidine  20 mg Oral BID  . tamsulosin  0.4 mg Oral Daily    OBJECTIVE  Filed Vitals:   09/25/13 1402 09/25/13 2149 09/25/13 2150 09/26/13 0542  BP: 130/80 141/99 141/99 152/108  Pulse: 88  74 94  Temp:   97.5 F (36.4 C) 98.5 F (36.9 C)  TempSrc:   Oral Oral  Resp:   16 16  Height:      Weight:      SpO2:   95% 98%    Intake/Output Summary (Last 24 hours) at 09/26/13 0956 Last data filed at 09/26/13 0826  Gross per 24 hour  Intake    300 ml  Output    200 ml  Net    100 ml   Filed Weights   09/24/13 1528 09/24/13 2034 09/25/13 0500  Weight: 201 lb (91.173 kg) 206 lb 9.6 oz (93.713 kg) 199 lb 9.6 oz (90.538 kg)    PHYSICAL EXAM General: NAD  Neck: No JVD, no thyromegaly.  Lungs: Dull at bases with mild crackles CV: Nondisplaced PMI. Irregular rhythm, normal rate, normal S1/S2, no S3, no murmur. No pretibial edema. No carotid bruit. Normal pedal pulses.  Abdomen: Soft, nontender, no hepatosplenomegaly, no distention.  Neurologic: Alert and oriented x 3.  Psych: Normal affect.  Extremities: No clubbing or cyanosis.    Accessory Clinical Findings  CBC  Recent Labs  09/25/13 0417 09/26/13 0150  WBC 9.4 8.6  HGB 12.5* 11.3*  HCT 39.7 36.2*  MCV 74.9* 74.9*  PLT 267 281   Basic Metabolic Panel  Recent Labs  09/24/13 1531 09/25/13 0417  NA 139 141  K 4.5 4.4  CL 102 105  CO2 23 21  GLUCOSE 117* 101*  BUN 16 16  CREATININE 0.93 0.87  CALCIUM 9.0 8.3*   Cardiac Enzymes  Recent Labs  09/25/13 1415 09/25/13 2030 09/26/13 0150  TROPONINI <0.30 <0.30 <0.30   Fasting Lipid  Panel  Recent Labs  09/25/13 0417  CHOL 141  HDL 38*  LDLCALC 88  TRIG 75  CHOLHDL 3.7   Thyroid Function Tests  Recent Labs  09/24/13 2130  TSH 1.630    TELE  Atrial fibrillation with controlled VR. His HRs have not been controlled.   Radiology/Studies  Dg Chest Portable 1 View  09/24/2013   CLINICAL DATA:  CHEST PAIN CHEST PAIN  EXAM: PORTABLE CHEST - 1 VIEW  COMPARISON:  DG CHEST 2 VIEW dated 04/07/2012  FINDINGS: The cardiac silhouette is enlarged. Low lung volumes. Mild prominence of the interstitial markings. No focal regions of consolidation or focal infiltrates. No acute osseous abnormalities.  IMPRESSION: Pulmonary vascular congestion.  Cardiomegaly      ASSESSMENT AND PLAN Cody Brewer is a 63 y.o. male with a history of HTN, AVNRT s/p ablation and CAD who presented to [facility] ED on 09/24/13 with new onset AF and chest pain.  Atrial fibrillation with RVR: HR presently controlled on oral dilt. Continue Coreg at present dose. TSH normal. CHADSVASC score is 1 (HTN), thus anticoagulation not currently  indicated. Will await echo results.  -- CXR with pulm vasc congestion and BNP elevated @2 .7K.    HTN: Oral diltiazem for rate control may also help to normalize BP. Continue Coreg at present dose.  BP better but still slightly elevated  Chest pain: Resolved with HR control. Nonobstructive CAD by cath in 03/2012, with elevated troponins at that time deemed to be secondary to demand ischemia related to rapid SVT. Echo in 03/2012 demonstrated normal LV systolic function and regional wall motion.  -- Troponin neg x3   Signed, Thereasa ParkinKathryn Stern PA-C  Pager [phone]

## 2013-09-26 NOTE — Progress Notes (Signed)
ANTICOAGULATION CONSULT NOTE - Follow Up Consult  Pharmacy Consult for heparin Indication: atrial fibrillation  No Known Allergies  Patient Measurements: Height: 6\' 1"  (185.4 cm) Weight: 199 lb 9.6 oz (90.538 kg) IBW/kg (Calculated) : 79.9 Heparin Dosing Weight: 90 kg  Vital Signs: Temp: 98.5 F (36.9 C) (05/11 0542) Temp src: Oral (05/11 0542) BP: 152/108 mmHg (05/11 0542) Pulse Rate: 94 (05/11 0542)  Labs:  Recent Labs  09/24/13 1531  09/25/13 0417 09/25/13 1148 09/25/13 1415 09/25/13 2030 09/26/13 0150  HGB 13.5  --  12.5*  --   --   --  11.3*  HCT 41.9  --  39.7  --   --   --  36.2*  PLT 348  --  267  --   --   --  281  LABPROT  --   --  15.1  --   --   --   --   INR  --   --  1.22  --   --   --   --   HEPARINUNFRC  --   < > 0.10* 0.31  --  0.38 0.40  CREATININE 0.93  --  0.87  --   --   --   --   TROPONINI  --   --   --   --  <0.30 <0.30 <0.30  < > = values in this interval not displayed.  Estimated Creatinine Clearance: 99.5 ml/min (by C-G formula based on Cr of 0.87).   Medications:  Scheduled:  . aspirin  162 mg Oral Daily  . carvedilol  25 mg Oral BID WC  . diltiazem  60 mg Oral 3 times per day  . famotidine  20 mg Oral BID  . polyethylene glycol  17 g Oral Daily  . sodium chloride  3 mL Intravenous Q12H  . tamsulosin  0.4 mg Oral Daily   Infusions:  . sodium chloride    . diltiazem (CARDIZEM) infusion Stopped (09/25/13 1716)  . heparin 1,800 Units/hr (09/26/13 0541)    Assessment: 63 yo male with afib is currently on therapeutic heparin.  Heparin level is 0.4. Goal of Therapy:  Heparin level 0.3-0.7 units/ml Monitor platelets by anticoagulation protocol: Yes   Plan:  1) Continue heparin at 1800 units/hr 2) Daily heparin level and CBC  Weyman Bogdon, Pharm.D., BCPS Clinical Pharmacist Pager [phone] 09/26/2013 10:44 AM

## 2013-09-26 NOTE — Progress Notes (Signed)
Pt. Seen and examined. Agree with the NP/PA-C note as written.  Still with exercise-related tachycardia. Mild dyspnea and features of CHF with elevated BNP, dullness at the lung bases and faint crackles. Would recommend lasix today. He has not had a BM in 48 hrs, will give miralax today. 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 Plan to keep NPO p MN for TEE/Cardioversion tomorrow.  He does report hypersomnolence, snoring and witnessed apnea at home according to his wife (now sleeps in another room) - suspicious for OSA - would arrange as an outpatient.  Chrystie Nose, MD, [facility] Attending Cardiologist [facility]

## 2013-09-27 ENCOUNTER — Encounter (HOSPITAL_COMMUNITY): Payer: Self-pay | Admitting: Anesthesiology

## 2013-09-27 ENCOUNTER — Encounter (HOSPITAL_COMMUNITY)
Admission: EM | Disposition: A | Discharge status: Home / Self Care | Payer: Self-pay | Source: Home / Self Care | Attending: Internal Medicine

## 2013-09-27 ENCOUNTER — Inpatient Hospital Stay (HOSPITAL_COMMUNITY): Payer: 59 | Admitting: Anesthesiology

## 2013-09-27 ENCOUNTER — Encounter (HOSPITAL_COMMUNITY): Payer: 59 | Admitting: Anesthesiology

## 2013-09-27 DIAGNOSIS — I4891 Unspecified atrial fibrillation: Secondary | ICD-10-CM

## 2013-09-27 DIAGNOSIS — I059 Rheumatic mitral valve disease, unspecified: Secondary | ICD-10-CM

## 2013-09-27 HISTORY — PX: TEE WITHOUT CARDIOVERSION: SHX5443

## 2013-09-27 HISTORY — PX: CARDIOVERSION: SHX1299

## 2013-09-27 LAB — CBC
HEMATOCRIT: 38.4 % — AB (ref 39.0–52.0)
HEMOGLOBIN: 12.5 g/dL — AB (ref 13.0–17.0)
MCH: 23.9 pg — ABNORMAL LOW (ref 26.0–34.0)
MCHC: 32.6 g/dL (ref 30.0–36.0)
MCV: 73.4 fL — ABNORMAL LOW (ref 78.0–100.0)
Platelets: 307 10*3/uL (ref 150–400)
RBC: 5.23 MIL/uL (ref 4.22–5.81)
RDW: 16 % — ABNORMAL HIGH (ref 11.5–15.5)
WBC: 9.3 10*3/uL (ref 4.0–10.5)

## 2013-09-27 LAB — HEPARIN LEVEL (UNFRACTIONATED): HEPARIN UNFRACTIONATED: 0.32 [IU]/mL (ref 0.30–0.70)

## 2013-09-27 SURGERY — ECHOCARDIOGRAM, TRANSESOPHAGEAL
Anesthesia: Monitor Anesthesia Care

## 2013-09-27 MED ORDER — MIDAZOLAM HCL 5 MG/5ML IJ SOLN
INTRAMUSCULAR | Status: DC | PRN
  Administered 2013-09-27: 1 mg via INTRAVENOUS

## 2013-09-27 MED ORDER — LABETALOL HCL 5 MG/ML IV SOLN
INTRAVENOUS | Status: DC | PRN
  Administered 2013-09-27 (×2): 5 mg via INTRAVENOUS
  Administered 2013-09-27: 10 mg via INTRAVENOUS

## 2013-09-27 MED ORDER — FUROSEMIDE 10 MG/ML IJ SOLN
40.0000 mg | Freq: Once | INTRAMUSCULAR | Status: AC
  Administered 2013-09-27: 40 mg via INTRAVENOUS
  Filled 2013-09-27: qty 4

## 2013-09-27 MED ORDER — HYDRALAZINE HCL 20 MG/ML IJ SOLN
10.0000 mg | Freq: Four times a day (QID) | INTRAMUSCULAR | Status: DC | PRN
  Administered 2013-09-27 – 2013-09-29 (×3): 10 mg via INTRAVENOUS
  Filled 2013-09-27 (×3): qty 1

## 2013-09-27 MED ORDER — BUTAMBEN-TETRACAINE-BENZOCAINE 2-2-14 % EX AERO
INHALATION_SPRAY | CUTANEOUS | Status: DC | PRN
  Administered 2013-09-27: 2 via TOPICAL

## 2013-09-27 MED ORDER — PROPOFOL 10 MG/ML IV BOLUS
INTRAVENOUS | Status: DC | PRN
Start: 2013-09-27 — End: 2013-09-27
  Administered 2013-09-27: 40 mg via INTRAVENOUS
  Administered 2013-09-27 (×2): 30 mg via INTRAVENOUS

## 2013-09-27 MED ORDER — MIDAZOLAM HCL 5 MG/ML IJ SOLN
INTRAMUSCULAR | Status: AC
  Filled 2013-09-27: qty 1

## 2013-09-27 NOTE — Progress Notes (Signed)
Patient Name: Cody Brewer Date of Encounter: 09/27/2013     Active Problems:   Atrial fibrillation    SUBJECTIVE  He feels tired out. He did not sleep well again last night due to palpitations. Worse with exertion. He does report mild orthopnea. He feels mildly SOB  CURRENT MEDS . aspirin  162 mg Oral Daily  . carvedilol  25 mg Oral BID WC  . diltiazem  60 mg Oral 3 times per day  . famotidine  20 mg Oral BID  . polyethylene glycol  17 g Oral Daily  . sodium chloride  3 mL Intravenous Q12H  . tamsulosin  0.4 mg Oral Daily    OBJECTIVE  Filed Vitals:   09/26/13 0542 09/26/13 1457 09/26/13 2128 09/27/13 0528  BP: 152/108 151/102 161/114 170/99  Pulse: 94 94 84 111  Temp: 98.5 F (36.9 C) 97.8 F (36.6 C) 97.8 F (36.6 C) 98 F (36.7 C)  TempSrc: Oral Oral Oral Oral  Resp: 16 17 20 20   Height:      Weight:    200 lb 8 oz (90.946 kg)  SpO2: 98% 95% 96% 94%    Intake/Output Summary (Last 24 hours) at 09/27/13 0947 Last data filed at 09/27/13 4825  Gross per 24 hour  Intake    696 ml  Output   1200 ml  Net   -504 ml   Filed Weights   09/24/13 2034 09/25/13 0500 09/27/13 0528  Weight: 206 lb 9.6 oz (93.713 kg) 199 lb 9.6 oz (90.538 kg) 200 lb 8 oz (90.946 kg)    PHYSICAL EXAM General: NAD  Neck: No JVD, no thyromegaly.  Lungs: Dull at bases with mild crackles CV: Nondisplaced PMI. Irregular rhythm, normal rate, normal S1/S2, no S3, no murmur. No pretibial edema. No carotid bruit. Normal pedal pulses.  Abdomen: Soft, nontender, no hepatosplenomegaly, no distention.  Neurologic: Alert and oriented x 3.  Psych: Normal affect.  Extremities: No clubbing or cyanosis.    Accessory Clinical Findings  CBC  Recent Labs  09/26/13 0150 09/27/13 0552  WBC 8.6 9.3  HGB 11.3* 12.5*  HCT 36.2* 38.4*  MCV 74.9* 73.4*  PLT 281 307   Basic Metabolic Panel  Recent Labs  09/24/13 1531 09/25/13 0417  NA 139 141  K 4.5 4.4  CL 102 105  CO2 23 21    GLUCOSE 117* 101*  BUN 16 16  CREATININE 0.93 0.87  CALCIUM 9.0 8.3*   Cardiac Enzymes  Recent Labs  09/25/13 1415 09/25/13 2030 09/26/13 0150  TROPONINI <0.30 <0.30 <0.30   Fasting Lipid Panel  Recent Labs  09/25/13 0417  CHOL 141  HDL 38*  LDLCALC 88  TRIG 75  CHOLHDL 3.7   Thyroid Function Tests  Recent Labs  09/24/13 2130  TSH 1.630    TELE  Atrial fibrillation with controlled VR. His HRs have not been controlled earlier this AM (in 150s).  Radiology/Studies  Dg Chest Portable 1 View  09/24/2013   CLINICAL DATA:  CHEST PAIN CHEST PAIN  EXAM: PORTABLE CHEST - 1 VIEW  COMPARISON:  DG CHEST 2 VIEW dated 04/07/2012  FINDINGS: The cardiac silhouette is enlarged. Low lung volumes. Mild prominence of the interstitial markings. No focal regions of consolidation or focal infiltrates. No acute osseous abnormalities.  IMPRESSION: Pulmonary vascular congestion.  Cardiomegaly      ASSESSMENT AND PLAN Cody Brewer is a 63 y.o. male with a history of HTN, AVNRT s/p ablation and CAD who  presented to [facility] ED on 09/24/13 with new onset AF and chest pain.  Atrial fibrillation with RVR: HR presently controlled on oral dilt. Continue Coreg at present dose. TSH normal. CHADSVASC score is 1 (HTN), thus anticoagulation not currently indicated. Will await echo results.  -- CXR with pulm vasc congestion on and BNP elevated @2.7K.  -- Will give one dose of 40mg IV Lasix to see if this helps his SOB -- His rate is presently controlled in the 90s. But poorly controlled last night and this morning. Awaiting TEE/DCCV today.  -- Continue dilt 60mg TID   HTN: Oral diltiazem for rate control may also help to normalize BP. Continue Coreg at present dose.  BP still slightly elevated. Up to 170/99.   Chest pain: Resolved with HR control. Nonobstructive CAD by cath in 03/2012, with elevated troponins at that time deemed to be secondary to demand ischemia related to rapid SVT. Echo in 03/2012  demonstrated normal LV systolic function and regional wall motion.  -- Troponin neg x3  Suspected OSA- reports hypersomnolence, snoring and witnessed apnea at home according to his wife (now sleeps in another room) - suspicious for OSA - will arrange as an outpatient.  Constipation-he is very uncomfortable. He has not had a BM in 4 days. given Mirelax yesterday, NPO until DCCV. If he does not have a BM after he eats after his procedure, he would like an enema.    Signed, Zhoe Catania Stern PA-C  Pager [phone]   

## 2013-09-27 NOTE — Clinical Documentation Improvement (Signed)
Possible Clinical Conditions?   Chronic Systolic Congestive Heart Failure Chronic Diastolic Congestive Heart Failure Chronic Systolic & Diastolic Congestive Heart Failure Acute Systolic Congestive Heart Failure Acute Diastolic Congestive Heart Failure Acute Systolic & Diastolic Congestive Heart Failure Acute on Chronic Systolic Congestive Heart Failure Acute on Chronic Diastolic Congestive Heart Failure Acute on Chronic Systolic & Diastolic Congestive Heart Failure Other Condition Cannot Clinically Determine    Risk Factors: Patient with features of CHF, with elevated BNP noted per 5/11 progress notes.  Labs: 5/09: proBNP:  2724.0   Thank You, Marciano Sequin, Clinical Documentation Specialist:  [phone]  [facility]- Health Information Management

## 2013-09-27 NOTE — Anesthesia Preprocedure Evaluation (Addendum)
Anesthesia Evaluation  Patient identified by MRN, date of birth, ID band Patient awake    Reviewed: Allergy & Precautions, H&P , NPO status , Patient's Chart, lab work & pertinent test results, reviewed documented beta blocker date and time   History of Anesthesia Complications Negative for: history of anesthetic complications  Airway Mallampati: I TM Distance: >3 FB Neck ROM: Full    Dental  (+) Edentulous Upper, Edentulous Lower   Pulmonary former smoker,  breath sounds clear to auscultation        Cardiovascular hypertension, Pt. on medications and Pt. on home beta blockers + CAD ('13 cath: non-obstructive coronary disease) and + Past MI + dysrhythmias Atrial Fibrillation and Supra Ventricular Tachycardia Rhythm:Irregular Rate:Tachycardia  '13 ECHO: normal LVF and valves   Neuro/Psych negative neurological ROS     GI/Hepatic Neg liver ROS, GERD-  Medicated and Controlled,  Endo/Other  negative endocrine ROS  Renal/GU negative Renal ROS     Musculoskeletal   Abdominal   Peds  Hematology negative hematology ROS (+)   Anesthesia Other Findings   Reproductive/Obstetrics                         Anesthesia Physical Anesthesia Plan  ASA: III  Anesthesia Plan: MAC   Post-op Pain Management:    Induction: Intravenous  Airway Management Planned: Natural Airway and Nasal Cannula  Additional Equipment:   Intra-op Plan:   Post-operative Plan:   Informed Consent: I have reviewed the patients History and Physical, chart, labs and discussed the procedure including the risks, benefits and alternatives for the proposed anesthesia with the patient or authorized representative who has indicated his/her understanding and acceptance.     Plan Discussed with: CRNA and Surgeon  Anesthesia Plan Comments: (Dr. Elease Hashimoto aware of diastolic hypertension, wishes to proceed.  Plan routine monitors, MAC)        Anesthesia Quick Evaluation

## 2013-09-27 NOTE — Interval H&P Note (Signed)
History and Physical Interval Note:  09/27/2013 2:12 PM  Cody Brewer  has presented today for surgery, with the diagnosis of afib  The various methods of treatment have been discussed with the patient and family. After consideration of risks, benefits and other options for treatment, the patient has consented to  Procedure(s): TRANSESOPHAGEAL ECHOCARDIOGRAM (TEE) (N/A) CARDIOVERSION (N/A) as a surgical intervention .  The patient's history has been reviewed, patient examined, no change in status, stable for surgery.  I have reviewed the patient's chart and labs.  Questions were answered to the patient's satisfaction.     Deloris Ping Brindley Madarang

## 2013-09-27 NOTE — CV Procedure (Signed)
    Transesophageal Echocardiogram Note  Cody Brewer [id number] 08/12/1950  Procedure: Transesophageal Echocardiogram Indications: atrial fib , CHF  Procedure Details Consent: Obtained Time Out: Verified patient identification, verified procedure, site/side was marked, verified correct patient position, special equipment/implants available, Radiology Safety Procedures followed,  medications/allergies/relevent history reviewed, required imaging and test results available.  Performed  Medications: Fentanyl:  Lebatolol 20 mg Versed: 1 mg  Propofol 100  ( per anesthesia   Left Ventrical:  Markedly depressed LV function,  EF 10-20 %.  + spontaneous contrast  Mitral Valve: moderate MR  Aortic Valve: normal AV  Tricuspid Valve: normal TV  Pulmonic Valve: not see well   Left Atrium/ Left atrial appendage: lots of spontaneous contrast. No thrombus  Atrial septum: intact.   Aorta: mild atheroma   Complications: No apparent complications Patient did tolerate procedure well.   Vesta Mixer, Montez Hageman., MD, [facility]      Cardioversion Note  Cody Brewer [id number] 02-19-1951  Procedure: DC Cardioversion Indications: atrial fib  Procedure Details Consent: Obtained Time Out: Verified patient identification, verified procedure, site/side was marked, verified correct patient position, special equipment/implants available, Radiology Safety Procedures followed,  medications/allergies/relevent history reviewed, required imaging and test results available.  Performed  The patient has been on adequate anticoagulation.  The patient received IV Profol 100 mg  for sedation.  Synchronous cardioversion was performed at 120  joules.  The cardioversion was successful .     Complications: No apparent complications Patient did tolerate procedure well.   Vesta Mixer, Montez Hageman., MD, [facility] 09/27/2013, 2:51 PM       09/27/2013, 2:49 PM

## 2013-09-27 NOTE — Telephone Encounter (Signed)
Pt is currently in [facility]. FYI.

## 2013-09-27 NOTE — H&P (View-Only) (Signed)
Pt. Seen and examined. Agree with the NP/PA-C note as written.  Agree with lasix - plan for TEE/DCCV today - he has altered atrial substrate from prior SVT ablation. If he does not convert, may need anti-arrythmic therapy.  Kenneth C. Hilty, MD, FACC Attending Cardiologist [facility]   

## 2013-09-27 NOTE — Progress Notes (Signed)
Telemetry Utilization protocol: Patient still in atrial fib. Continue telemetry. Pt to have cardioversion today.

## 2013-09-27 NOTE — Progress Notes (Signed)
Pt's HR is sustained in the 120's-140's, BP=170/99, pt resting in bed, cardizem 60mg  PO given, pt is scheduled for cardioversion today. MD on call notified. Will continue to monitor.

## 2013-09-27 NOTE — Anesthesia Postprocedure Evaluation (Signed)
  Anesthesia Post-op Note  Patient: Cody Brewer  Procedure(s) Performed: Procedure(s): TRANSESOPHAGEAL ECHOCARDIOGRAM (TEE) (N/A) CARDIOVERSION (N/A)  Patient Location: PACU and Endoscopy Unit  Anesthesia Type:MAC  Level of Consciousness: awake, alert  and oriented  Airway and Oxygen Therapy: Patient Spontanous Breathing and Patient connected to nasal cannula oxygen  Post-op Pain: none  Post-op Assessment: Post-op Vital signs reviewed and Patient's Cardiovascular Status Stable  Post-op Vital Signs: Reviewed and stable  Last Vitals:  Filed Vitals:   09/27/13 1327  BP: 169/109  Pulse: 111  Temp: 36.7 C  Resp: 19    Complications: No apparent anesthesia complications

## 2013-09-27 NOTE — Progress Notes (Signed)
  Echocardiogram 2D Echocardiogram has been performed.  [name] 09/27/2013, 12:27 PM

## 2013-09-27 NOTE — Transfer of Care (Signed)
Immediate Anesthesia Transfer of Care Note  Patient: Cody Brewer  Procedure(s) Performed: Procedure(s): TRANSESOPHAGEAL ECHOCARDIOGRAM (TEE) (N/A) CARDIOVERSION (N/A)  Patient Location: PACU and Endoscopy Unit  Anesthesia Type:MAC  Level of Consciousness: awake, alert  and oriented  Airway & Oxygen Therapy: Patient Spontanous Breathing and Patient connected to nasal cannula oxygen  Post-op Assessment: Report given to PACU RN and Post -op Vital signs reviewed and stable  Post vital signs: Reviewed and stable  Complications: No apparent anesthesia complications

## 2013-09-27 NOTE — Progress Notes (Signed)
Pt. Seen and examined. Agree with the NP/PA-C note as written.  Agree with lasix - plan for TEE/DCCV today - he has altered atrial substrate from prior SVT ablation. If he does not convert, may need anti-arrythmic therapy.  Chrystie Nose, MD, [facility] Attending Cardiologist [facility]

## 2013-09-27 NOTE — Progress Notes (Signed)
ANTICOAGULATION CONSULT NOTE - Follow Up Consult  Pharmacy Consult for heparin Indication: atrial fibrillation  No Known Allergies  Patient Measurements: Height: 6\' 1"  (185.4 cm) Weight: 200 lb 8 oz (90.946 kg) IBW/kg (Calculated) : 79.9 Heparin Dosing Weight: 90 kg  Vital Signs: Temp: 98 F (36.7 C) (05/12 0528) Temp src: Oral (05/12 0528) BP: 170/99 mmHg (05/12 0528) Pulse Rate: 111 (05/12 0528)  Labs:  Recent Labs  09/24/13 1531 09/25/13 0417  09/25/13 1415 09/25/13 2030 09/26/13 0150 09/27/13 0552  HGB 13.5 12.5*  --   --   --  11.3* 12.5*  HCT 41.9 39.7  --   --   --  36.2* 38.4*  PLT 348 267  --   --   --  281 307  LABPROT  --  15.1  --   --   --   --   --   INR  --  1.22  --   --   --   --   --   HEPARINUNFRC  --  0.10*  < >  --  0.38 0.40 0.32  CREATININE 0.93 0.87  --   --   --   --   --   TROPONINI  --   --   --  <0.30 <0.30 <0.30  --   < > = values in this interval not displayed.  Estimated Creatinine Clearance: 99.5 ml/min (by C-G formula based on Cr of 0.87).   Medications:  Scheduled:  . aspirin  162 mg Oral Daily  . carvedilol  25 mg Oral BID WC  . diltiazem  60 mg Oral 3 times per day  . famotidine  20 mg Oral BID  . polyethylene glycol  17 g Oral Daily  . sodium chloride  3 mL Intravenous Q12H  . tamsulosin  0.4 mg Oral Daily   Infusions:  . sodium chloride    . sodium chloride Stopped (09/26/13 1324)  . diltiazem (CARDIZEM) infusion Stopped (09/25/13 1716)  . heparin 1,800 Units/hr (09/27/13 0524)    Assessment: 63 yo male with afib is currently on therapeutic heparin.  Heparin level is 0.32.  Noted plans for cardioversion today.  No plans for long term anticoagulation. Goal of Therapy:  Heparin level 0.3-0.7 units/ml Monitor platelets by anticoagulation protocol: Yes   Plan:  1) Continue heparin at 1800 units/hr 2) Daily heparin level and CBC  Jakeria Caissie, Pharm.D., BCPS Clinical Pharmacist Pager [phone] 09/27/2013 9:59  AM

## 2013-09-27 NOTE — Progress Notes (Signed)
  Echocardiogram Echocardiogram Transesophageal has been performed.  Deandrew Hoecker Esmond Harps 09/27/2013, 2:59 PM

## 2013-09-27 NOTE — H&P (View-Only) (Signed)
Patient Name: Cody Brewer Date of Encounter: 09/27/2013     Active Problems:   Atrial fibrillation    SUBJECTIVE  He feels tired out. He did not sleep well again last night due to palpitations. Worse with exertion. He does report mild orthopnea. He feels mildly SOB  CURRENT MEDS . aspirin  162 mg Oral Daily  . carvedilol  25 mg Oral BID WC  . diltiazem  60 mg Oral 3 times per day  . famotidine  20 mg Oral BID  . polyethylene glycol  17 g Oral Daily  . sodium chloride  3 mL Intravenous Q12H  . tamsulosin  0.4 mg Oral Daily    OBJECTIVE  Filed Vitals:   09/26/13 0542 09/26/13 1457 09/26/13 2128 09/27/13 0528  BP: 152/108 151/102 161/114 170/99  Pulse: 94 94 84 111  Temp: 98.5 F (36.9 C) 97.8 F (36.6 C) 97.8 F (36.6 C) 98 F (36.7 C)  TempSrc: Oral Oral Oral Oral  Resp: 16 17 20 20   Height:      Weight:    200 lb 8 oz (90.946 kg)  SpO2: 98% 95% 96% 94%    Intake/Output Summary (Last 24 hours) at 09/27/13 0947 Last data filed at 09/27/13 4825  Gross per 24 hour  Intake    696 ml  Output   1200 ml  Net   -504 ml   Filed Weights   09/24/13 2034 09/25/13 0500 09/27/13 0528  Weight: 206 lb 9.6 oz (93.713 kg) 199 lb 9.6 oz (90.538 kg) 200 lb 8 oz (90.946 kg)    PHYSICAL EXAM General: NAD  Neck: No JVD, no thyromegaly.  Lungs: Dull at bases with mild crackles CV: Nondisplaced PMI. Irregular rhythm, normal rate, normal S1/S2, no S3, no murmur. No pretibial edema. No carotid bruit. Normal pedal pulses.  Abdomen: Soft, nontender, no hepatosplenomegaly, no distention.  Neurologic: Alert and oriented x 3.  Psych: Normal affect.  Extremities: No clubbing or cyanosis.    Accessory Clinical Findings  CBC  Recent Labs  09/26/13 0150 09/27/13 0552  WBC 8.6 9.3  HGB 11.3* 12.5*  HCT 36.2* 38.4*  MCV 74.9* 73.4*  PLT 281 307   Basic Metabolic Panel  Recent Labs  09/24/13 1531 09/25/13 0417  NA 139 141  K 4.5 4.4  CL 102 105  CO2 23 21    GLUCOSE 117* 101*  BUN 16 16  CREATININE 0.93 0.87  CALCIUM 9.0 8.3*   Cardiac Enzymes  Recent Labs  09/25/13 1415 09/25/13 2030 09/26/13 0150  TROPONINI <0.30 <0.30 <0.30   Fasting Lipid Panel  Recent Labs  09/25/13 0417  CHOL 141  HDL 38*  LDLCALC 88  TRIG 75  CHOLHDL 3.7   Thyroid Function Tests  Recent Labs  09/24/13 2130  TSH 1.630    TELE  Atrial fibrillation with controlled VR. His HRs have not been controlled earlier this AM (in 150s).  Radiology/Studies  Dg Chest Portable 1 View  09/24/2013   CLINICAL DATA:  CHEST PAIN CHEST PAIN  EXAM: PORTABLE CHEST - 1 VIEW  COMPARISON:  DG CHEST 2 VIEW dated 04/07/2012  FINDINGS: The cardiac silhouette is enlarged. Low lung volumes. Mild prominence of the interstitial markings. No focal regions of consolidation or focal infiltrates. No acute osseous abnormalities.  IMPRESSION: Pulmonary vascular congestion.  Cardiomegaly      ASSESSMENT AND PLAN Cody Brewer is a 63 y.o. male with a history of HTN, AVNRT s/p ablation and CAD who  [facility] ED [facility] 09/24/13 with new onset AF and chest pain.  Atrial fibrillation with RVR: HR presently controlled on oral dilt. Continue Coreg at present dose. TSH normal. CHADSVASC score is 1 (HTN), thus anticoagulation not currently indicated. Will await echo results.  -- CXR with pulm vasc congestion on and BNP elevated @2 .7K.  -- Will give one dose of 40mg  IV Lasix to see if this helps his SOB -- His rate is presently controlled in the 90s. But poorly controlled last night and this morning. Awaiting TEE/DCCV today.  -- Continue dilt 60mg  TID   HTN: Oral diltiazem for rate control may also help to normalize BP. Continue Coreg at present dose.  BP still slightly elevated. Up to 170/99.   Chest pain: Resolved with HR control. Nonobstructive CAD by cath in 03/2012, with elevated troponins at that time deemed to be secondary to demand ischemia related to rapid SVT. Echo in 03/2012  demonstrated normal LV systolic function and regional wall motion.  -- Troponin neg x3  Suspected OSA- reports hypersomnolence, snoring and witnessed apnea at home according to his wife (now sleeps in another room) - suspicious for OSA - will arrange as an outpatient.  Constipation-he is very uncomfortable. He has not had a BM in 4 days. given Mirelax yesterday, NPO until DCCV. If he does not have a BM after he eats after his procedure, he would like an enema.    Signed, Thereasa ParkinKathryn Stern PA-C  Pager [phone]

## 2013-09-28 ENCOUNTER — Encounter (HOSPITAL_COMMUNITY): Payer: Self-pay | Admitting: Cardiovascular Disease

## 2013-09-28 ENCOUNTER — Encounter (HOSPITAL_COMMUNITY)
Admission: EM | Disposition: A | Discharge status: Home / Self Care | Payer: Self-pay | Source: Home / Self Care | Attending: Internal Medicine

## 2013-09-28 DIAGNOSIS — R079 Chest pain, unspecified: Secondary | ICD-10-CM

## 2013-09-28 DIAGNOSIS — I428 Other cardiomyopathies: Secondary | ICD-10-CM

## 2013-09-28 DIAGNOSIS — I509 Heart failure, unspecified: Secondary | ICD-10-CM

## 2013-09-28 HISTORY — PX: LEFT AND RIGHT HEART CATHETERIZATION WITH CORONARY ANGIOGRAM: SHX5449

## 2013-09-28 LAB — CBC
HCT: 39.5 % (ref 39.0–52.0)
Hemoglobin: 12.8 g/dL — ABNORMAL LOW (ref 13.0–17.0)
MCH: 23.9 pg — ABNORMAL LOW (ref 26.0–34.0)
MCHC: 32.4 g/dL (ref 30.0–36.0)
MCV: 73.8 fL — ABNORMAL LOW (ref 78.0–100.0)
Platelets: 303 10*3/uL (ref 150–400)
RBC: 5.35 MIL/uL (ref 4.22–5.81)
RDW: 16.3 % — ABNORMAL HIGH (ref 11.5–15.5)
WBC: 11 10*3/uL — ABNORMAL HIGH (ref 4.0–10.5)

## 2013-09-28 LAB — POCT I-STAT 3, ART BLOOD GAS (G3+)
ACID-BASE DEFICIT: 2 mmol/L (ref 0.0–2.0)
Acid-base deficit: 2 mmol/L (ref 0.0–2.0)
Bicarbonate: 20.8 mEq/L (ref 20.0–24.0)
Bicarbonate: 21.6 mEq/L (ref 20.0–24.0)
O2 SAT: 87 %
O2 SAT: 92 %
PCO2 ART: 27.8 mmHg — AB (ref 35.0–45.0)
PH ART: 7.482 — AB (ref 7.350–7.450)
PO2 ART: 58 mmHg — AB (ref 80.0–100.0)
TCO2: 22 mmol/L (ref 0–100)
TCO2: 23 mmol/L (ref 0–100)
pCO2 arterial: 31.6 mmHg — ABNORMAL LOW (ref 35.0–45.0)
pH, Arterial: 7.443 (ref 7.350–7.450)
pO2, Arterial: 50 mmHg — ABNORMAL LOW (ref 80.0–100.0)

## 2013-09-28 LAB — POCT I-STAT 3, VENOUS BLOOD GAS (G3P V)
Acid-base deficit: 1 mmol/L (ref 0.0–2.0)
Acid-base deficit: 1 mmol/L (ref 0.0–2.0)
BICARBONATE: 22.3 meq/L (ref 20.0–24.0)
Bicarbonate: 22.5 mEq/L (ref 20.0–24.0)
O2 Saturation: 62 %
O2 Saturation: 64 %
PCO2 VEN: 32.1 mmHg — AB (ref 45.0–50.0)
PCO2 VEN: 32.8 mmHg — AB (ref 45.0–50.0)
PH VEN: 7.444 — AB (ref 7.250–7.300)
PH VEN: 7.45 — AB (ref 7.250–7.300)
PO2 VEN: 31 mmHg (ref 30.0–45.0)
TCO2: 23 mmol/L (ref 0–100)
TCO2: 24 mmol/L (ref 0–100)
pO2, Ven: 31 mmHg (ref 30.0–45.0)

## 2013-09-28 LAB — HEPARIN LEVEL (UNFRACTIONATED)
Heparin Unfractionated: 0.14 IU/mL — ABNORMAL LOW (ref 0.30–0.70)
Heparin Unfractionated: 0.53 IU/mL (ref 0.30–0.70)

## 2013-09-28 LAB — POCT ACTIVATED CLOTTING TIME: ACTIVATED CLOTTING TIME: 160 s

## 2013-09-28 SURGERY — LEFT AND RIGHT HEART CATHETERIZATION WITH CORONARY ANGIOGRAM
Anesthesia: LOCAL

## 2013-09-28 MED ORDER — MIDAZOLAM HCL 2 MG/2ML IJ SOLN
INTRAMUSCULAR | Status: AC
  Filled 2013-09-28: qty 2

## 2013-09-28 MED ORDER — LORAZEPAM 0.5 MG PO TABS
0.5000 mg | ORAL_TABLET | Freq: Once | ORAL | Status: AC
  Administered 2013-09-28: 0.5 mg via ORAL
  Filled 2013-09-28: qty 1

## 2013-09-28 MED ORDER — SODIUM CHLORIDE 0.9 % IV SOLN: INTRAVENOUS | Status: AC

## 2013-09-28 MED ORDER — MINERAL OIL RE ENEM
1.0000 | ENEMA | Freq: Once | RECTAL | Status: AC
  Administered 2013-09-28: 1 via RECTAL
  Filled 2013-09-28: qty 1

## 2013-09-28 MED ORDER — LIDOCAINE HCL (PF) 1 % IJ SOLN
INTRAMUSCULAR | Status: AC
  Filled 2013-09-28: qty 30

## 2013-09-28 MED ORDER — FENTANYL CITRATE 0.05 MG/ML IJ SOLN
INTRAMUSCULAR | Status: AC
  Filled 2013-09-28: qty 2

## 2013-09-28 MED ORDER — LABETALOL HCL 5 MG/ML IV SOLN
10.0000 mg | Freq: Once | INTRAVENOUS | Status: AC
  Administered 2013-09-28: 10 mg via INTRAVENOUS
  Filled 2013-09-28: qty 4

## 2013-09-28 MED ORDER — FUROSEMIDE 10 MG/ML IJ SOLN: 40.0000 mg | Freq: Once | INTRAMUSCULAR | Status: DC

## 2013-09-28 MED ORDER — HEPARIN (PORCINE) IN NACL 100-0.45 UNIT/ML-% IJ SOLN
2100.0000 [IU]/h | INTRAMUSCULAR | Status: DC
  Administered 2013-09-28: 2100 [IU]/h via INTRAVENOUS
  Filled 2013-09-28 (×3): qty 250

## 2013-09-28 MED ORDER — NITROGLYCERIN 0.2 MG/ML ON CALL CATH LAB
INTRAVENOUS | Status: AC
  Filled 2013-09-28: qty 1

## 2013-09-28 MED ORDER — HEPARIN BOLUS VIA INFUSION
3000.0000 [IU] | Freq: Once | INTRAVENOUS | Status: AC
  Administered 2013-09-28: 3000 [IU] via INTRAVENOUS
  Filled 2013-09-28: qty 3000

## 2013-09-28 MED ORDER — LISINOPRIL 10 MG PO TABS
10.0000 mg | ORAL_TABLET | Freq: Every day | ORAL | Status: DC
  Administered 2013-09-28: 10 mg via ORAL
  Filled 2013-09-28 (×2): qty 1

## 2013-09-28 MED ORDER — ASPIRIN 81 MG PO CHEW: 81.0000 mg | CHEWABLE_TABLET | ORAL | Status: DC

## 2013-09-28 MED ORDER — FUROSEMIDE 40 MG PO TABS
40.0000 mg | ORAL_TABLET | Freq: Two times a day (BID) | ORAL | Status: DC
  Administered 2013-09-28 – 2013-09-29 (×3): 40 mg via ORAL
  Filled 2013-09-28 (×4): qty 1

## 2013-09-28 MED ORDER — HEPARIN (PORCINE) IN NACL 2-0.9 UNIT/ML-% IJ SOLN
INTRAMUSCULAR | Status: AC
  Filled 2013-09-28: qty 1000

## 2013-09-28 MED ORDER — SODIUM CHLORIDE 0.9 % IV SOLN: 1.0000 mL/kg/h | INTRAVENOUS | Status: DC

## 2013-09-28 MED ORDER — SODIUM CHLORIDE 0.9 % IJ SOLN: 3.0000 mL | INTRAMUSCULAR | Status: DC | PRN

## 2013-09-28 MED ORDER — SODIUM CHLORIDE 0.9 % IV SOLN: 250.0000 mL | INTRAVENOUS | Status: DC | PRN

## 2013-09-28 MED ORDER — SODIUM CHLORIDE 0.9 % IJ SOLN: 3.0000 mL | Freq: Two times a day (BID) | INTRAMUSCULAR | Status: DC

## 2013-09-28 NOTE — CV Procedure (Signed)
   Cardiac Catheterization Procedure Note  Name: LEVERE FAHRER MRN: [id number] DOB: 1950/12/03  Procedure: Right Heart Cath, Left Heart Cath, Selective Coronary Angiography, LV angiography  Indication: Chest pain with cardiomyopathy and congestive heart failure   Sedation: 1 mg of Versed and 25 mcg fentanyl. Contrast: 65 ml  Procedural Details: The right groin was prepped, draped, and anesthetized with 1% lidocaine. Using the modified Seldinger technique a 5 French sheath was placed in the right femoral artery and a 7 French sheath was placed in the right femoral vein. A Swan-Ganz catheter was used for the right heart catheterization. Standard protocol was followed for recording of right heart pressures and sampling of oxygen saturations. Fick cardiac output was calculated. Standard Judkins catheters were used for selective coronary angiography and left ventriculography. There were no immediate procedural complications. The patient was transferred to the post catheterization recovery area for further monitoring.  Procedural Findings: Hemodynamics RA 14 mmHg RV 40/3 mmHg PA  41/20 mmHg PCWP 22 mmHg LV 149/13 mmHg . LVEDP: 24 mmHg AO 150/103 mmHg  Oxygen saturations: PA 64% AO 92%  Cardiac Output (Fick) 5.87  Cardiac Index (Fick) 2.73   Aortic Valve: Peak to Peak gradient: Not significant Pulmonary vascular resistance (PVR): 2 Woods units.  Coronary angiography: Coronary dominance: Right   Left Main:  Normal  Left Anterior Descending (LAD):  Normal in size with minor irregularities distally.  1st diagonal (D1):  Normal in size with 20% proximal stenosis.  2nd diagonal (D2):  Large in size with no significant disease.  3rd diagonal (D3):  Normal in size with minor irregularities.  Circumflex (LCx):  Normal in size and nondominant. The vessel has no significant disease.  1st obtuse marginal:  Small in size with no significant disease.  2nd obtuse marginal:  Normal  in size with no significant disease.  3rd obtuse marginal:  Medium in size with no significant disease.   AV groove continuation segment: Normal.  Right Coronary Artery: Normal in size and dominant. There is 20% proximal stenosis. The rest of the vessel is free of significant disease.  Posterior descending artery: Normal in size with no significant disease.  Posterior AV segment: Normal  Posterolateral branchs:  Normal  Left ventriculography: Left ventricular systolic function is severely reduced , LVEF is estimated at 25-30  %, there is no significant mitral regurgitation . There is severe hypokinesis in the basal segments.  Final Conclusions:   1. Moderately elevated filling pressures with normal cardiac output 2. No evidence of obstructive coronary artery disease with only minor irregularities. 3. Severely reduced LV systolic function with an ejection fraction of 25-30%.  Recommendations:  The patient has nonischemic cardiomyopathy which could be tachycardia induced or due to hypertensive heart disease. I started lisinopril and Lasix. Consider spironolactone. Heparin to be in resumed 8 hours after sheath pull given his recent cardioversion. Oral anticoagulation can be started tomorrow morning. Possible discharge in the morning.  Iran Ouch MD, [facility] 09/28/2013, 1:41 PM

## 2013-09-28 NOTE — Progress Notes (Signed)
ANTICOAGULATION CONSULT NOTE - Follow Up Consult  Pharmacy Consult  :  Heparin Indication  :  atrial fibrillation   Heparin Dosing Weight: 90 kg   Recent Labs  09/25/13 2030 09/26/13 0150 09/27/13 0552 09/28/13 0344 09/28/13 1040  HGB  --  11.3* 12.5* 12.8*  --   HCT  --  36.2* 38.4* 39.5  --   PLT  --  281 307 303  --   HEPARINUNFRC 0.38 0.40 0.32 0.14* 0.53    Medications: Scheduled:  . aspirin  162 mg Oral Daily  . carvedilol  25 mg Oral BID WC  . diltiazem  60 mg Oral 3 times per day  . famotidine  20 mg Oral BID  . furosemide  40 mg Intravenous Once  . mineral oil  1 enema Rectal Once  . polyethylene glycol  17 g Oral Daily  . sodium chloride  3 mL Intravenous Q12H  . tamsulosin  0.4 mg Oral Daily    Infusions:  . sodium chloride 0 mL (09/26/13 1324)  . sodium chloride 300 mL (09/28/13 1438)  . RESTART HEPARIN 8hrs after sheath removal 2,100 Units/hr   .      Assessment:  S/p Cardiac Cath today.  Findings incluce severely reduced LV systolic function with EF 25 - 30 %.  Heparin to be restarted 8 hours after sheath removal because of recent cardioversion.  Goal: Heparin Level  >  0.3 - 0.7 units/ml   Plan: 1. Restart Heparin, no bolus, at 2100 units/hr.  Begin 2230 pm, 8 hours after sheath removal. 2. Will check Heparin Level in 8 hours , then 3. Daily Heparin Levels, Platelet counts, CBC.  Monitor for bleeding complications    Laurena Bering,  Pharm.D  09/28/2013, 3:02 PM

## 2013-09-28 NOTE — Progress Notes (Signed)
Pt. Seen and examined. Agree with the NP/PA-C note as written.  Successful cardioversion to sinus yesterday - he is maintaining it. EF is now worse at 25-30% with regional wall motion abnormalities. Would recommend [facility] today. Please keep NPO - he ate a small breakfast at 8:00. Still constipated - will order enema today. Continue IV heparin for now - we will determine long-term anticoagulation after cath.  Chrystie Nose, MD, [facility] Attending Cardiologist [facility]

## 2013-09-28 NOTE — H&P (View-Only) (Signed)
Patient Name: Cody Brewer Date of Encounter: 09/28/2013     Active Problems:   Atrial fibrillation    SUBJECTIVE  He reports chest pain and SOB with high BPs yesterday. He was able to sleep with hydralazine and ativan. Feeling fine today. Constipated.   CURRENT MEDS . aspirin  162 mg Oral Daily  . carvedilol  25 mg Oral BID WC  . diltiazem  60 mg Oral 3 times per day  . famotidine  20 mg Oral BID  . polyethylene glycol  17 g Oral Daily  . sodium chloride  3 mL Intravenous Q12H  . tamsulosin  0.4 mg Oral Daily    OBJECTIVE  Filed Vitals:   09/27/13 2202 09/27/13 2304 09/28/13 0217 09/28/13 0503  BP: 164/113 170/110 171/112 162/92  Pulse:    87  Temp:    98.6 F (37 C)  TempSrc:    Oral  Resp:    23  Height:      Weight:      SpO2:    96%    Intake/Output Summary (Last 24 hours) at 09/28/13 0757 Last data filed at 09/28/13 0654  Gross per 24 hour  Intake    103 ml  Output    500 ml  Net   -397 ml   Filed Weights   09/24/13 2034 09/25/13 0500 09/27/13 0528  Weight: 206 lb 9.6 oz (93.713 kg) 199 lb 9.6 oz (90.538 kg) 200 lb 8 oz (90.946 kg)    PHYSICAL EXAM General: NAD  Neck: No JVD, no thyromegaly.  Lungs: Dull at bases with mild crackles CV: Nondisplaced PMI. Irregular rhythm, normal rate, normal S1/S2, no S3, no murmur. No pretibial edema. No carotid bruit. Normal pedal pulses.  Abdomen: Soft, nontender, no hepatosplenomegaly, no distention.  Neurologic: Alert and oriented x 3.  Psych: Normal affect.  Extremities: No clubbing or cyanosis.    Accessory Clinical Findings  CBC  Recent Labs  09/27/13 0552 09/28/13 0344  WBC 9.3 11.0*  HGB 12.5* 12.8*  HCT 38.4* 39.5  MCV 73.4* 73.8*  PLT 307 303    Cardiac Enzymes  Recent Labs  09/25/13 1415 09/25/13 2030 09/26/13 0150  TROPONINI <0.30 <0.30 <0.30    TELE  NSR HR 80s  Radiology/Studies  Dg Chest Portable 1 View  09/24/2013   CLINICAL DATA:  CHEST PAIN CHEST PAIN   EXAM: PORTABLE CHEST - 1 VIEW  COMPARISON:  DG CHEST 2 VIEW dated 04/07/2012  FINDINGS: The cardiac silhouette is enlarged. Low lung volumes. Mild prominence of the interstitial markings. No focal regions of consolidation or focal infiltrates. No acute osseous abnormalities.  IMPRESSION: Pulmonary vascular congestion.  Cardiomegaly     Study Date: 09/27/2013 LV EF: 30% - 35% ------------------------------------------------------------ Study Conclusions - Left ventricle: The cavity size was normal. Wall thickness was increased in a pattern of mild LVH. Systolic function was moderately to severely reduced. The estimated ejection fraction was in the range of 30% to 35%. There is akinesis of the mid-distalanteroseptal myocardium. - Aortic valve: Trivial regurgitation. - Mitral valve: Mild regurgitation. - Left atrium: The atrium was moderately dilated. - Right ventricle: The cavity size was mildly dilated. Wall thickness was normal. - Right atrium: The atrium was mildly dilated. Impressions: - When compared to prior, EF is reduced.   ASSESSMENT AND PLAN [name] is a 63 y.o. male with a history of HTN, AVNRT s/p ablation and CAD who presented to [facility] ED on 09/24/13 with new onset  AF and chest pain.  Atrial fibrillation with RVR: s/p successful TEE/DCCV. TSH normal. CHADS2 score is 1 (HTN). -- His rate is NSR and presently controlled in the 80s. Continue Coreg and dilt.  -- Continue heparin htt  Cardiomyopathy- newly reduced EF: TTE with EF 30-35%, down from 55-60% in 03/2012.   -- There is akinesis of the mid-distalanteroseptal myocardium. He will need a R/LHC, planned for later today. NPO now.   CHF- CXR with pulm vasc congestion on and BNP elevated @2 .7K. Reports new orthopnea and PND.  -- Gvien one dose of 40mg  IV Lasix which helped his SOB. Will give him one more.   HTN:  BP elevated. Up to 171/112. He reports chest pain and SOB with high pressures that was relieved with  hydralazine. Consider increasing his BP meds.   Chest pain: Resolved with HR control. Nonobstructive CAD by cath in 03/2012, with elevated troponins at that time deemed to be secondary to demand ischemia related to rapid SVT. Echo in 03/2012 demonstrated normal LV systolic function and regional wall motion. Now ECHO with newly reduced EF and WMA. On for cath later today. -- Troponin neg x3  Suspected OSA- reports hypersomnolence, snoring and witnessed apnea at home according to his wife (now sleeps in another room) - suspicious for OSA - will arrange as an outpatient.  Constipation-he is very uncomfortable. He has not had a BM in 5 days. If he does not have a BM after he eats today, he would like an enema.    Signed, Thereasa ParkinKathryn Stern PA-C  Pager [phone]

## 2013-09-28 NOTE — Interval H&P Note (Signed)
Cath Lab Visit (complete for each Cath Lab visit)  Clinical Evaluation Leading to the Procedure:   ACS: no  Non-ACS:    Anginal Classification: CCS IV  Anti-ischemic medical therapy: Minimal Therapy (1 class of medications)  Non-Invasive Test Results: No non-invasive testing performed  Prior CABG: No previous CABG      History and Physical Interval Note:  09/28/2013 12:57 PM  Cody Brewer  has presented today for surgery, with the diagnosis of cp  The various methods of treatment have been discussed with the patient and family. After consideration of risks, benefits and other options for treatment, the patient has consented to  Procedure(s): LEFT AND RIGHT HEART CATHETERIZATION WITH CORONARY ANGIOGRAM (N/A) as a surgical intervention .  The patient's history has been reviewed, patient examined, no change in status, stable for surgery.  I have reviewed the patient's chart and labs.  Questions were answered to the patient's satisfaction.     [place] Ouch

## 2013-09-28 NOTE — Progress Notes (Signed)
Patient Name: Cody Brewer Date of Encounter: 09/28/2013     Active Problems:   Atrial fibrillation    SUBJECTIVE  He reports chest pain and SOB with high BPs yesterday. He was able to sleep with hydralazine and ativan. Feeling fine today. Constipated.   CURRENT MEDS . aspirin  162 mg Oral Daily  . carvedilol  25 mg Oral BID WC  . diltiazem  60 mg Oral 3 times per day  . famotidine  20 mg Oral BID  . polyethylene glycol  17 g Oral Daily  . sodium chloride  3 mL Intravenous Q12H  . tamsulosin  0.4 mg Oral Daily    OBJECTIVE  Filed Vitals:   09/27/13 2202 09/27/13 2304 09/28/13 0217 09/28/13 0503  BP: 164/113 170/110 171/112 162/92  Pulse:    87  Temp:    98.6 F (37 C)  TempSrc:    Oral  Resp:    23  Height:      Weight:      SpO2:    96%    Intake/Output Summary (Last 24 hours) at 09/28/13 0757 Last data filed at 09/28/13 0654  Gross per 24 hour  Intake    103 ml  Output    500 ml  Net   -397 ml   Filed Weights   09/24/13 2034 09/25/13 0500 09/27/13 0528  Weight: 206 lb 9.6 oz (93.713 kg) 199 lb 9.6 oz (90.538 kg) 200 lb 8 oz (90.946 kg)    PHYSICAL EXAM General: NAD  Neck: No JVD, no thyromegaly.  Lungs: Dull at bases with mild crackles CV: Nondisplaced PMI. Irregular rhythm, normal rate, normal S1/S2, no S3, no murmur. No pretibial edema. No carotid bruit. Normal pedal pulses.  Abdomen: Soft, nontender, no hepatosplenomegaly, no distention.  Neurologic: Alert and oriented x 3.  Psych: Normal affect.  Extremities: No clubbing or cyanosis.    Accessory Clinical Findings  CBC  Recent Labs  09/27/13 0552 09/28/13 0344  WBC 9.3 11.0*  HGB 12.5* 12.8*  HCT 38.4* 39.5  MCV 73.4* 73.8*  PLT 307 303    Cardiac Enzymes  Recent Labs  09/25/13 1415 09/25/13 2030 09/26/13 0150  TROPONINI <0.30 <0.30 <0.30    TELE  NSR HR 80s  Radiology/Studies  Dg Chest Portable 1 View  09/24/2013   CLINICAL DATA:  CHEST PAIN CHEST PAIN   EXAM: PORTABLE CHEST - 1 VIEW  COMPARISON:  DG CHEST 2 VIEW dated 04/07/2012  FINDINGS: The cardiac silhouette is enlarged. Low lung volumes. Mild prominence of the interstitial markings. No focal regions of consolidation or focal infiltrates. No acute osseous abnormalities.  IMPRESSION: Pulmonary vascular congestion.  Cardiomegaly     Study Date: 09/27/2013 LV EF: 30% - 35% ------------------------------------------------------------ Study Conclusions - Left ventricle: The cavity size was normal. Wall thickness was increased in a pattern of mild LVH. Systolic function was moderately to severely reduced. The estimated ejection fraction was in the range of 30% to 35%. There is akinesis of the mid-distalanteroseptal myocardium. - Aortic valve: Trivial regurgitation. - Mitral valve: Mild regurgitation. - Left atrium: The atrium was moderately dilated. - Right ventricle: The cavity size was mildly dilated. Wall thickness was normal. - Right atrium: The atrium was mildly dilated. Impressions: - When compared to prior, EF is reduced.   ASSESSMENT AND PLAN Cody Brewer is a 63 y.o. male with a history of HTN, AVNRT s/p ablation and CAD who presented to [facility] ED on 09/24/13 with new onset  AF and chest pain.  Atrial fibrillation with RVR: s/p successful TEE/DCCV. TSH normal. CHADS2 score is 1 (HTN). -- His rate is NSR and presently controlled in the 80s. Continue Coreg and dilt.  -- Continue heparin htt  Cardiomyopathy- newly reduced EF: TTE with EF 30-35%, down from 55-60% in 03/2012.   -- There is akinesis of the mid-distalanteroseptal myocardium. He will need a R/LHC, planned for later today. NPO now.   CHF- CXR with pulm vasc congestion on and BNP elevated @2.7K. Reports new orthopnea and PND.  -- Gvien one dose of 40mg IV Lasix which helped his SOB. Will give him one more.   HTN:  BP elevated. Up to 171/112. He reports chest pain and SOB with high pressures that was relieved with  hydralazine. Consider increasing his BP meds.   Chest pain: Resolved with HR control. Nonobstructive CAD by cath in 03/2012, with elevated troponins at that time deemed to be secondary to demand ischemia related to rapid SVT. Echo in 03/2012 demonstrated normal LV systolic function and regional wall motion. Now ECHO with newly reduced EF and WMA. On for cath later today. -- Troponin neg x3  Suspected OSA- reports hypersomnolence, snoring and witnessed apnea at home according to his wife (now sleeps in another room) - suspicious for OSA - will arrange as an outpatient.  Constipation-he is very uncomfortable. He has not had a BM in 5 days. If he does not have a BM after he eats today, he would like an enema.    Signed, Merlyn Conley Stern PA-C  Pager [phone]   

## 2013-09-28 NOTE — Progress Notes (Signed)
ANTICOAGULATION CONSULT NOTE - Follow Up Consult  Pharmacy Consult for heparin Indication: atrial fibrillation  Labs:  Recent Labs  09/25/13 1415 09/25/13 2030  09/26/13 0150 09/27/13 0552 09/28/13 0344  HGB  --   --   < > 11.3* 12.5* 12.8*  HCT  --   --   --  36.2* 38.4* 39.5  PLT  --   --   --  281 307 303  HEPARINUNFRC  --  0.38  --  0.40 0.32 0.14*  TROPONINI <0.30 <0.30  --  <0.30  --   --   < > = values in this interval not displayed.   Assessment: 63yo male now subtherapeutic on heparin after several levels at low end of goal; RN reports a short (~23min) interruption to gtt that should not have contributed to this drop in level; had successful cardioversion yesterday.  Goal of Therapy:  Heparin level 0.3-0.7 units/ml   Plan:  Will rebolus with heparin 3000 units and increase heparin gtt by 3 units/kg/hr to 2100 units/hr and check level in 6hr.  Vernard Gambles, PharmD, BCPS  09/28/2013,5:04 AM

## 2013-09-28 NOTE — H&P (View-Only) (Signed)
Pt. Seen and examined. Agree with the NP/PA-C note as written.  Successful cardioversion to sinus yesterday - he is maintaining it. EF is now worse at 25-30% with regional wall motion abnormalities. Would recommend R/LHC today. Please keep NPO - he ate a small breakfast at 8:00. Still constipated - will order enema today. Continue IV heparin for now - we will determine long-term anticoagulation after cath.  Kenneth C. Hilty, MD, FACC Attending Cardiologist [facility]   

## 2013-09-29 DIAGNOSIS — I428 Other cardiomyopathies: Secondary | ICD-10-CM

## 2013-09-29 DIAGNOSIS — I5020 Unspecified systolic (congestive) heart failure: Secondary | ICD-10-CM

## 2013-09-29 DIAGNOSIS — I2510 Atherosclerotic heart disease of native coronary artery without angina pectoris: Secondary | ICD-10-CM

## 2013-09-29 DIAGNOSIS — I10 Essential (primary) hypertension: Secondary | ICD-10-CM

## 2013-09-29 LAB — CBC
HEMATOCRIT: 40 % (ref 39.0–52.0)
HEMOGLOBIN: 13.3 g/dL (ref 13.0–17.0)
MCH: 24.1 pg — AB (ref 26.0–34.0)
MCHC: 33.3 g/dL (ref 30.0–36.0)
MCV: 72.6 fL — ABNORMAL LOW (ref 78.0–100.0)
Platelets: 299 10*3/uL (ref 150–400)
RBC: 5.51 MIL/uL (ref 4.22–5.81)
RDW: 16 % — ABNORMAL HIGH (ref 11.5–15.5)
WBC: 9.4 10*3/uL (ref 4.0–10.5)

## 2013-09-29 LAB — BASIC METABOLIC PANEL
BUN: 9 mg/dL (ref 6–23)
CHLORIDE: 103 meq/L (ref 96–112)
CO2: 21 meq/L (ref 19–32)
CREATININE: 0.76 mg/dL (ref 0.50–1.35)
Calcium: 8.9 mg/dL (ref 8.4–10.5)
GFR calc non Af Amer: >90 mL/min (ref >90)
Glucose, Bld: 95 mg/dL (ref 70–99)
Potassium: 4.1 mEq/L (ref 3.7–5.3)
Sodium: 138 mEq/L (ref 137–147)

## 2013-09-29 LAB — HEPARIN LEVEL (UNFRACTIONATED): Heparin Unfractionated: 0.34 IU/mL (ref 0.30–0.70)

## 2013-09-29 MED ORDER — ISOSORB DINITRATE-HYDRALAZINE 20-37.5 MG PO TABS
1.0000 | ORAL_TABLET | Freq: Two times a day (BID) | ORAL | Status: DC
  Administered 2013-09-29: 1 via ORAL
  Filled 2013-09-29 (×2): qty 1

## 2013-09-29 MED ORDER — FUROSEMIDE 40 MG PO TABS: 40.0000 mg | ORAL_TABLET | Freq: Two times a day (BID) | ORAL | Status: DC

## 2013-09-29 MED ORDER — APIXABAN 5 MG PO TABS
5.0000 mg | ORAL_TABLET | Freq: Two times a day (BID) | ORAL | Status: DC
  Administered 2013-09-29: 5 mg via ORAL
  Filled 2013-09-29 (×2): qty 1

## 2013-09-29 MED ORDER — LISINOPRIL 20 MG PO TABS
20.0000 mg | ORAL_TABLET | Freq: Every day | ORAL | Status: DC
  Administered 2013-09-29: 20 mg via ORAL
  Filled 2013-09-29: qty 1

## 2013-09-29 MED ORDER — ISOSORB DINITRATE-HYDRALAZINE 20-37.5 MG PO TABS: 1.0000 | ORAL_TABLET | Freq: Two times a day (BID) | ORAL | Status: DC

## 2013-09-29 MED ORDER — LORAZEPAM 0.5 MG PO TABS
0.5000 mg | ORAL_TABLET | Freq: Once | ORAL | Status: AC
  Administered 2013-09-29: 0.5 mg via ORAL
  Filled 2013-09-29: qty 1

## 2013-09-29 MED ORDER — LISINOPRIL 20 MG PO TABS: 20.0000 mg | ORAL_TABLET | Freq: Every day | ORAL | Status: DC

## 2013-09-29 MED ORDER — APIXABAN 5 MG PO TABS: 5.0000 mg | ORAL_TABLET | Freq: Two times a day (BID) | ORAL | Status: DC

## 2013-09-29 NOTE — Progress Notes (Signed)
Blood pressure at the time of discharge 133/86. Educational and discharge handouts given; emphasis placed on diet and daily weight. Questions answered. D/C home with spouse to self care; awake, alert, and conversant.

## 2013-09-29 NOTE — Progress Notes (Signed)
Patient Name: Cody Brewer Date of Encounter: 09/29/2013     Active Problems:   Atrial fibrillation   Nonischemic cardiomyopathy - EF 25-30%. nonobstructive CAD on LHC 09/28/12   Systolic CHF   HTN (hypertension)    SUBJECTIVE  Cath yesterday showed no significant CAD, but as we know significant, non-ischemic cardiomyopathy.  CURRENT MEDS . aspirin  162 mg Oral Daily  . carvedilol  25 mg Oral BID WC  . diltiazem  60 mg Oral 3 times per day  . famotidine  20 mg Oral BID  . furosemide  40 mg Oral BID  . lisinopril  10 mg Oral Daily  . polyethylene glycol  17 g Oral Daily  . tamsulosin  0.4 mg Oral Daily    OBJECTIVE  Filed Vitals:   09/28/13 1244 09/28/13 2232 09/29/13 0000 09/29/13 0400  BP:  154/101 150/91 169/101  Pulse: 82 86 91 87  Temp:  98.2 F (36.8 C) 98.4 F (36.9 C) 98.6 F (37 C)  TempSrc:  Oral Oral Oral  Resp:  20 20 20   Height:      Weight:    190 lb 4.8 oz (86.32 kg)  SpO2:  93% 96% 92%    Intake/Output Summary (Last 24 hours) at 09/29/13 0839 Last data filed at 09/29/13 0700  Gross per 24 hour  Intake 1676.8 ml  Output   3175 ml  Net -1498.2 ml   Filed Weights   09/25/13 0500 09/27/13 0528 09/29/13 0400  Weight: 199 lb 9.6 oz (90.538 kg) 200 lb 8 oz (90.946 kg) 190 lb 4.8 oz (86.32 kg)    PHYSICAL EXAM General: NAD  Neck: No JVD, no thyromegaly.  Lungs: Dull at bases with mild crackles CV: Nondisplaced PMI. Irregular rhythm, normal rate, normal S1/S2, no S3, no murmur. No pretibial edema. No carotid bruit. Normal pedal pulses.  Abdomen: Soft, nontender, no hepatosplenomegaly, no distention.  Neurologic: Alert and oriented x 3.  Psych: Normal affect.  Extremities: No clubbing or cyanosis.    Accessory Clinical Findings  CBC  Recent Labs  09/28/13 0344 09/29/13 0700  WBC 11.0* 9.4  HGB 12.8* 13.3  HCT 39.5 40.0  MCV 73.8* 72.6*  PLT 303 299    Cardiac Enzymes No results found for this basename: CKTOTAL,  CKMB, CKMBINDEX, TROPONINI,  in the last 72 hours  TELE  NSR HR 80s  Radiology/Studies  Dg Chest Portable 1 View  09/24/2013   CLINICAL DATA:  CHEST PAIN CHEST PAIN  EXAM: PORTABLE CHEST - 1 VIEW  COMPARISON:  DG CHEST 2 VIEW dated 04/07/2012  FINDINGS: The cardiac silhouette is enlarged. Low lung volumes. Mild prominence of the interstitial markings. No focal regions of consolidation or focal infiltrates. No acute osseous abnormalities.  IMPRESSION: Pulmonary vascular congestion.  Cardiomegaly     Study Date: 09/27/2013 LV EF: 30% - 35% ------------------------------------------------------------ Study Conclusions - Left ventricle: The cavity size was normal. Wall thickness was increased in a pattern of mild LVH. Systolic function was moderately to severely reduced. The estimated ejection fraction was in the range of 30% to 35%. There is akinesis of the mid-distalanteroseptal myocardium. - Aortic valve: Trivial regurgitation. - Mitral valve: Mild regurgitation. - Left atrium: The atrium was moderately dilated. - Right ventricle: The cavity size was mildly dilated. Wall thickness was normal. - Right atrium: The atrium was mildly dilated. Impressions: - When compared to prior, EF is reduced.   ASSESSMENT AND PLAN Cody Brewer is a 63 y.o.  male with a history of HTN, AVNRT s/p ablation and CAD who [facility]24/13 with new onset AF and chest pain.  Atrial fibrillation with RVR: s/p successful TEE/DCCV. TSH normal. CHADS2 score is 2 (CHF, HTN). Maintaining sinus. -- His rate is NSR and presently controlled in the 80s. Continue Coreg and dilt.  -- Switch to Eliquis 5 mg BID today.  Cardiomyopathy- newly reduced EF: TTE with EF 30-35%, down from 55-60% in 03/2012.   -- Cath yesterday shows no obstructive CAD. ACE-I added.   CHF- CXR with pulm vasc congestion on and BNP elevated @2 .7K. Reports new orthopnea and PND.  -- Change to po lasix today. Start BIdil for  afterload reduction.  HTN:  BP elevated. Up to 171/112. He reports chest pain and SOB with high pressures that was relieved with hydralazine prn. Add lisinopril 20 mg daily today.  Chest pain: Resolved with HR control. Nonobstructive CAD by cath in 03/2012, with elevated troponins at that time deemed to be secondary to demand ischemia related to rapid SVT. Echo in 03/2012 demonstrated normal LV systolic function and regional wall motion. Now ECHO with newly reduced EF and WMA. On for cath later today. -- Troponin neg x3 -- Essentially normal coronaries by cath yesterday.  Suspected OSA- reports hypersomnolence, snoring and witnessed apnea at home according to his wife (now sleeps in another room) - suspicious for OSA - will arrange as an outpatient.  Constipation- still with small BM's after enema. Will need outpatient GI consult.  Probable d/c later today if BP is better controlled <150 systolic.  Chrystie NoseKenneth C. Hilty, MD, [facility] HospitalFACC Attending Cardiologist [facility]

## 2013-09-29 NOTE — Progress Notes (Signed)
ANTICOAGULATION CONSULT NOTE - Follow Up Consult  Pharmacy Consult for Heparin >> Eliquis Indication: atrial fibrillation  No Known Allergies  Patient Measurements: Height: 6\' 1"  (185.4 cm) Weight: 190 lb 4.8 oz (86.32 kg) IBW/kg (Calculated) : 79.9  Vital Signs: Temp: 98.6 F (37 C) (05/14 0400) Temp src: Oral (05/14 0400) BP: 169/101 mmHg (05/14 0400) Pulse Rate: 87 (05/14 0400)  Labs:  Recent Labs  09/27/13 0552 09/28/13 0344 09/28/13 1040 09/29/13 0700 09/29/13 0720  HGB 12.5* 12.8*  --  13.3  --   HCT 38.4* 39.5  --  40.0  --   PLT 307 303  --  299  --   HEPARINUNFRC 0.32 0.14* 0.53  --  0.34    Estimated Creatinine Clearance: 99.5 ml/min (by C-G formula based on Cr of 0.87).   Medications:  Scheduled:  . apixaban  5 mg Oral BID  . aspirin  162 mg Oral Daily  . carvedilol  25 mg Oral BID WC  . famotidine  20 mg Oral BID  . furosemide  40 mg Oral BID  . isosorbide-hydrALAZINE  1 tablet Oral BID  . lisinopril  20 mg Oral Daily  . polyethylene glycol  17 g Oral Daily  . tamsulosin  0.4 mg Oral Daily    Assessment: 62 yo M with afib s/p successful TEE/DCCV on 5/12.  Pt has been continued on therapeutic heparin and is to transition to Eliquis for anticoagulation.  Goal of Therapy:  Therapeutic Anticoaulation Monitor platelets by anticoagulation protocol: Yes   Plan:  Discontinue heparin and heparin labs. Eliquis 5mg  PO BID Will provide Eliquis education prior to discharge.  [employer], Pharm.D., BCPS Clinical Pharmacist Pager [phone] 09/29/2013 10:04 AM

## 2013-09-29 NOTE — Discharge Summary (Signed)
Physician Discharge Summary  Patient ID: Cody NeuJames A Churchman MRN: [id number] DOB/AGE: 62/11/1950 62 y.o.  Primary Cardiologist: Dr. Ladona Ridgelaylor Admit date: 09/24/2013 Discharge date: 09/29/2013  Admission Diagnoses: Atrial Fibrillation w/ RVR  Discharge Diagnoses:  Active Problems:   Atrial fibrillation   Nonischemic cardiomyopathy - EF 25-30%. nonobstructive CAD on LHC 09/28/12   Systolic CHF   HTN (hypertension)   Discharged Condition: stable  Hospital Course: The patient is a [age] AAM, followed by Dr. 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 He was found to be in Afib w/ RVR with rates in the 140s. He was started on IV Cardizem and admitted for further w/u. TSH was normal.  He ultimately required TEE/DCCV. This was performed by Dr. Elease HashimotoNahser. TEE was negative for left atrial clot, but did demonstrate a low EF of 10-20%. He was successfully cardioverted back to NSR after 1 shock at 120J. He was continued on a BB. PO Cardizem was not prescribed given is severely reduced systolic function (see details below). His CHADS-VASc score is 2 (HTN, CHF). He was started on 5 mg of Eliquis BID.   He was also noted to be in acute HF at time of admission. A CXR demonstrated pulmonary vascular congestion and BNP was elevated at 2724. IV Lasix was initiated. Post cardioversion, a 2D echo was performed and demonstrated an EF of 30-35% with akinesis of the mid-distalanteroseptal myocardium. His EF was significantly reduced compared to prior study (55-60% in 2013). Subsequently, he underwent a R/LHC by Dr. Kirke CorinArida, via the right femoral artery. He had moderately elevated filling pressures with normal cardiac output.  There was no evidence of obstructive coronary artery disease with only minor irregularities.  EF on cath was 25-30%. He left the cath lab in stable condition and was placed on the  appropriate HF medications. He had no post cath complications. The right femoral access site remained stable. He did have some problems with HTN and his medications had to be adjusted. His meds included Coreg, lisinopril and Bidil and Lasix.   By hospital day 5, his BP had improved, he was maintaining NSR on telemetry. He had no further chest pain and breathing improved. He was last seen and examined by Dr. Rennis GoldenHilty, who determined he was stable for discharge. He is scheduled to f/u with Rick DuffBrooke Edmisten, PA-C, on 10/11/13 for post hospital f/u. He will need to f/u long term with Dr. Ladona Ridgelaylor.    Consults: None  Significant Diagnostic Studies:   CXR 09/24/13 FINDINGS: The cardiac silhouette is enlarged. Low lung volumes. Mild prominence of the interstitial markings. No focal regions of consolidation or focal infiltrates. No acute osseous abnormalities.  IMPRESSION: Pulmonary vascular congestion.  Cardiomegaly    TEE 09/27/13 Left Ventrical: Markedly depressed LV function, EF 10-20 %. + spontaneous contrast  Mitral Valve: moderate MR  Aortic Valve: normal AV  Tricuspid Valve: normal TV  Pulmonic Valve: not see well  Left Atrium/ Left atrial appendage: lots of spontaneous contrast. No thrombus  Atrial septum: intact.  Aorta: mild atheroma    [facility] Procedural Findings:  Hemodynamics  RA 14 mmHg  RV 40/3 mmHg  PA 41/20 mmHg  PCWP 22 mmHg  LV 149/13 mmHg . LVEDP: 24 mmHg  AO 150/103 mmHg  Oxygen saturations:  PA 64%  AO 92%  Cardiac Output (Fick) 5.87  Cardiac Index (Fick) 2.73  Aortic Valve:  Peak to Peak gradient: Not significant  Pulmonary vascular resistance (PVR): 2 Woods units.  Coronary angiography:  Coronary dominance: Right  Left Main: Normal  Left Anterior Descending (LAD): Normal in size with minor irregularities distally.  1st diagonal (D1): Normal in size with 20% proximal stenosis.  2nd diagonal (D2): Large in size with no significant disease.  3rd  diagonal (D3): Normal in size with minor irregularities.  Circumflex (LCx): Normal in size and nondominant. The vessel has no significant disease.  1st obtuse marginal: Small in size with no significant disease.  2nd obtuse marginal: Normal in size with no significant disease.  3rd obtuse marginal: Medium in size with no significant disease.  AV groove continuation segment: Normal.  Right Coronary Artery: Normal in size and dominant. There is 20% proximal stenosis. The rest of the vessel is free of significant disease.  Posterior descending artery: Normal in size with no significant disease.  Posterior AV segment: Normal  Posterolateral branchs: Normal Left ventriculography: Left ventricular systolic function is severely reduced , LVEF is estimated at 25-30 %, there is no significant mitral regurgitation . There is severe hypokinesis in the basal segments   Treatments: See Hospital Course  Discharge Exam: Blood pressure 122/70, pulse 88, temperature 98.5 F (36.9 C), temperature source Oral, resp. rate 20, height 6\' 1"  (1.854 m), weight 190 lb 4.8 oz (86.32 kg), SpO2 92.00%.   Disposition: 01-Home or Self Care      Discharge Orders   Future Appointments Provider Department Dept Phone   10/11/2013 2:30 PM Minda Meo, PA-C [facility] [phone]   Future Orders Complete By Expires   Diet - low sodium heart healthy  As directed    Increase activity slowly  As directed        Medication List    STOP taking these medications       aspirin 81 MG chewable tablet      TAKE these medications       apixaban 5 MG Tabs tablet  Commonly known as:  ELIQUIS  Take 1 tablet (5 mg total) by mouth 2 (two) times daily.     apixaban 5 MG Tabs tablet  Commonly known as:  ELIQUIS  Take 1 tablet (5 mg total) by mouth 2 (two) times daily.     carvedilol 25 MG tablet  Commonly known as:  COREG  Take 2 tablets by mouth twice a day     famotidine 20 MG tablet    Commonly known as:  PEPCID  Take 1 tablet (20 mg total) by mouth 2 (two) times daily.     furosemide 40 MG tablet  Commonly known as:  LASIX  Take 1 tablet (40 mg total) by mouth 2 (two) times daily.     isosorbide-hydrALAZINE 20-37.5 MG per tablet  Commonly known as:  BIDIL  Take 1 tablet by mouth 2 (two) times daily.     lisinopril 20 MG tablet  Commonly known as:  PRINIVIL,ZESTRIL  Take 1 tablet (20 mg total) by mouth daily.     tamsulosin 0.4 MG Caps capsule  Commonly known as:  FLOMAX  Take 0.4 mg by mouth daily.       Follow-up Information   Follow up with Rick Duff, PA-C On 10/11/2013. (2:30 PM)    Specialty:  Cardiology   Contact information:   [address] [place] [address] [phone]      TIME SPENT ON DISCHARGE, INCLUDING PHYSICIAN TIME: >30 MINUTES  Signed: Robbie Lis 09/29/2013, 4:23 PM

## 2013-09-29 NOTE — Progress Notes (Deleted)
Patient Profile: Cody Brewer is a 63 y.o. male with a history of HTN, AVNRT s/p ablation and CAD who [facility] with new onset AF and chest pain. Now with newly diagnosed nonischemic cardiomyopathy w/ an EF of 25-30%.   S/p DCCV 09/27/13 S/p R/LHC 09/28/13  Subjective: Feels "OK". He continues to feel SOB on exertion. Denies resting dyspnea. He continues to sleep with the head of the bed elevated (mainly due to low back pain), so he can not fully r/o orthopnea or PND. He denies chest pain. He still has yet to have a BM. He is passing flatus.   Objective: Vital signs in last 24 hours: Temp:  [98.1 F (36.7 C)-98.6 F (37 C)] 98.6 F (37 C) (05/14 0400) Pulse Rate:  [82-91] 87 (05/14 0400) Resp:  [20-22] 20 (05/14 0400) BP: (150-169)/(91-101) 169/101 mmHg (05/14 0400) SpO2:  [92 %-96 %] 92 % (05/14 0400) Weight:  [190 lb 4.8 oz (86.32 kg)] 190 lb 4.8 oz (86.32 kg) (05/14 0400) Last BM Date: 09/23/13  Intake/Output from previous day: 05/13 0701 - 05/14 0700 In: 1676.8 [P.O.:740; I.V.:936.8] Out: 3175 [Urine:3175] Intake/Output this shift:    Medications Current Facility-Administered Medications  Medication Dose Route Frequency Provider Last Rate Last Dose  . aspirin chewable tablet 162 mg  162 mg Oral Daily Glori Luisaniel Friedman, MD   162 mg at 09/28/13 1100  . carvedilol (COREG) tablet 25 mg  25 mg Oral BID WC Glori Luisaniel Friedman, MD   25 mg at 09/28/13 1648  . diltiazem (CARDIZEM) tablet 60 mg  60 mg Oral 3 times per day Laqueta LindenSuresh A Koneswaran, MD   60 mg at 09/29/13 0622  . famotidine (PEPCID) tablet 20 mg  20 mg Oral BID Glori Luisaniel Friedman, MD   20 mg at 09/28/13 2117  . furosemide (LASIX) tablet 40 mg  40 mg Oral BID Iran OuchMuhammad A Arida, MD   40 mg at 09/28/13 1648  . heparin ADULT infusion 100 units/mL (25000 units/250 mL)  2,100 Units/hr Intravenous Continuous Marinus MawGregg W Taylor, MD 21 mL/hr at 09/28/13 2245 2,100 Units/hr at 09/28/13 2245  . hydrALAZINE (APRESOLINE)  injection 10 mg  10 mg Intravenous Q6H PRN Christie Nottinghamahul Sharma, MD   10 mg at 09/29/13 0607  . lisinopril (PRINIVIL,ZESTRIL) tablet 20 mg  20 mg Oral Daily Chrystie NoseKenneth C. Hilty, MD      . polyethylene glycol (MIRALAX / GLYCOLAX) packet 17 g  17 g Oral Daily Chrystie NoseKenneth C. Hilty, MD   17 g at 09/28/13 [id number]  . tamsulosin (FLOMAX) capsule 0.4 mg  0.4 mg Oral Daily Glori Luisaniel Friedman, MD   0.4 mg at 09/28/13 1100    PE: General appearance: alert, cooperative and no distress Neck: + JVD with + HJR Lungs: faint right-sided basilar rales. LL field fairly clear Heart: regular rate and rhythm Extremities: no LEE Pulses: 2+ and symmetric Skin: warm and dry  Neurologic: Grossly normal  Lab Results:   Recent Labs  09/27/13 0552 09/28/13 0344 09/29/13 0700  WBC 9.3 11.0* 9.4  HGB 12.5* 12.8* 13.3  HCT 38.4* 39.5 40.0  PLT 307 303 299   BMET No results found for this basename: NA, K, CL, CO2, GLUCOSE, BUN, CREATININE, CALCIUM,  in the last 72 hours   [facility]  Procedural Findings:  Hemodynamics  RA 14 mmHg  RV 40/3 mmHg  PA 41/20 mmHg  PCWP 22 mmHg  LV 149/13 mmHg . LVEDP: 24 mmHg  AO 150/103 mmHg  Oxygen saturations:  PA 64%  AO 92%  Cardiac Output (Fick) 5.87  Cardiac Index (Fick) 2.73  Aortic Valve: Peak to Peak gradient: Not significant  Pulmonary vascular resistance (PVR): 2 Woods units.  Coronary angiography:  Coronary dominance: Right  Left Main: Normal  Left Anterior Descending (LAD): Normal in size with minor irregularities distally.  1st diagonal (D1): Normal in size with 20% proximal stenosis.  2nd diagonal (D2): Large in size with no significant disease.  3rd diagonal (D3): Normal in size with minor irregularities.  Circumflex (LCx): Normal in size and nondominant. The essel has no significant disease.  1st obtuse marginal: Small in size with no significant disease.  2nd obtuse marginal: Normal in size with no significant disease.  3rd obtuse marginal:  Medium in size with no significant disease.  AV groove continuation segment: Normal.  Right Coronary Artery: Normal in size and dominant. There is 20% proximal stenosis. The rest of the vessel is free of significant disease.  Posterior descending artery: Normal in size with no significant disease.  Posterior AV segment: Normal  Posterolateral branchs: Normalv Left ventriculography: Left ventricular systolic function is severely reduced , LVEF is estimated at 25-30 %, there is no significant mitral regurgitation . There is severe hypokinesis in the basal segments.  Final Conclusions:  1. Moderately elevated filling pressures with normal cardiac output  2. No evidence of obstructive coronary artery disease with only minor irregularities.  3. Severely reduced LV systolic function with an ejection fraction of 25-30%.    Assessment/Plan  Active Problems:   Atrial fibrillation   Nonischemic cardiomyopathy - EF 25-30%. nonobstructive CAD on [facility] 09/28/12   Systolic CHF   HTN (hypertension)  Atrial fibrillation with RVR: s/p successful TEE/DCCV. TSH normal. CHADS2 score is 1 (HTN).  --Maintaining NSR on telemetry with HR in the 90s.  --Continue Coreg.  --Stop Cardizem in the setting of reduced systolic dysfunction. If additional rate control is needed, may consider adding digoxin -- He will need at least 3 weeks of continued anticoagulation, given he is post cardioversion. ? Xarelto vs Eliquis.  Cardiomyopathy- newly reduced EF: 25-30% on cath, down from 55-60% in 03/2012.  -- LHC on 09/28/13 demonstrated nonobstructive CAD, making the diagnosis of nonischemic cardiomyopathy. -- Continue BB, ACE and diuretic.  -- With severe LV systolic dysfunction, we will need to discontinue his Cardizem, as this is a non-dihydropyridine CCB which may worsen his HF. --He will require maximum titration of his HF meds, as HR and BP allows. He is on the max dose of Coreg. Can increase linisopril to 20 mg, given  his HTN. --Repeat 2D echo in 3 months. If EF remains <35% then he may require an ICD.    CHF- Still with DOE, faint right-sided basilar rales and JVD with + HJR.  RHC yesterday revealed moderately elevated filling pressures.  -- Continue BID Lasix. -- Continue BB, ACE -- Daily weights -- Low sodium diet  -- BMP has not been checked in 4 days. Will order to check electrolytes and renal function.   HTN- BP elevated. Up to 169/101. -- Continue 25 mg Coreg BID -- Will increase Lisinopril to 20 mg  --  Recommend d/c Cardizem -- Can add Hydralazine + Nitrate if BP still poorly controlled.   Chest pain - Resolved with HR control. Nonobstructive CAD by cath in 09/28/13.  Suspected OSA- reports hypersomnolence, snoring and witnessed apnea at home according to his wife (now sleeps in another room) - suspicious for OSA - will arrange sleep study  as an outpatient.   Constipation-he is very uncomfortable. He has not had a BM in 5 days. If he does not have a BM after he eats today, he would like an enema.   Right Groin: Day 1 s/p R/LHC.  Denies anterior groin, flank and back pain. Groin stable.   LOS: 5 days    Brittainy M. Delmer Islam 09/29/2013 8:43 AM

## 2013-10-08 ENCOUNTER — Emergency Department (HOSPITAL_COMMUNITY): Payer: 59

## 2013-10-08 ENCOUNTER — Encounter (HOSPITAL_COMMUNITY): Payer: Self-pay | Admitting: Emergency Medicine

## 2013-10-08 ENCOUNTER — Inpatient Hospital Stay (HOSPITAL_COMMUNITY)
Admission: EM | Admit: 2013-10-08 | Discharge: 2013-10-10 | DRG: 312 | Disposition: A | Discharge status: Home / Self Care | Payer: 59 | Attending: Cardiovascular Disease | Admitting: Cardiovascular Disease

## 2013-10-08 DIAGNOSIS — F1210 Cannabis abuse, uncomplicated: Secondary | ICD-10-CM | POA: Diagnosis present

## 2013-10-08 DIAGNOSIS — I2510 Atherosclerotic heart disease of native coronary artery without angina pectoris: Secondary | ICD-10-CM | POA: Diagnosis present

## 2013-10-08 DIAGNOSIS — E7800 Pure hypercholesterolemia, unspecified: Secondary | ICD-10-CM | POA: Diagnosis present

## 2013-10-08 DIAGNOSIS — Z87891 Personal history of nicotine dependence: Secondary | ICD-10-CM

## 2013-10-08 DIAGNOSIS — J45909 Unspecified asthma, uncomplicated: Secondary | ICD-10-CM | POA: Diagnosis present

## 2013-10-08 DIAGNOSIS — Z7901 Long term (current) use of anticoagulants: Secondary | ICD-10-CM

## 2013-10-08 DIAGNOSIS — I10 Essential (primary) hypertension: Secondary | ICD-10-CM | POA: Diagnosis present

## 2013-10-08 DIAGNOSIS — I5022 Chronic systolic (congestive) heart failure: Secondary | ICD-10-CM | POA: Diagnosis present

## 2013-10-08 DIAGNOSIS — I428 Other cardiomyopathies: Secondary | ICD-10-CM | POA: Diagnosis present

## 2013-10-08 DIAGNOSIS — R079 Chest pain, unspecified: Secondary | ICD-10-CM

## 2013-10-08 DIAGNOSIS — I4891 Unspecified atrial fibrillation: Secondary | ICD-10-CM

## 2013-10-08 DIAGNOSIS — I252 Old myocardial infarction: Secondary | ICD-10-CM

## 2013-10-08 DIAGNOSIS — T465X5A Adverse effect of other antihypertensive drugs, initial encounter: Secondary | ICD-10-CM | POA: Diagnosis present

## 2013-10-08 DIAGNOSIS — R55 Syncope and collapse: Secondary | ICD-10-CM | POA: Diagnosis present

## 2013-10-08 DIAGNOSIS — I509 Heart failure, unspecified: Secondary | ICD-10-CM | POA: Diagnosis present

## 2013-10-08 LAB — BASIC METABOLIC PANEL
BUN: 21 mg/dL (ref 6–23)
CALCIUM: 9.1 mg/dL (ref 8.4–10.5)
CHLORIDE: 101 meq/L (ref 96–112)
CO2: 25 mEq/L (ref 19–32)
Creatinine, Ser: 1.01 mg/dL (ref 0.50–1.35)
GFR calc Af Amer: >90 mL/min (ref >90)
GFR calc non Af Amer: 78 mL/min — ABNORMAL LOW (ref >90)
Glucose, Bld: 120 mg/dL — ABNORMAL HIGH (ref 70–99)
Potassium: 4 mEq/L (ref 3.7–5.3)
Sodium: 138 mEq/L (ref 137–147)

## 2013-10-08 LAB — URINALYSIS, ROUTINE W REFLEX MICROSCOPIC
Bilirubin Urine: NEGATIVE
GLUCOSE, UA: NEGATIVE mg/dL
HGB URINE DIPSTICK: NEGATIVE
Ketones, ur: NEGATIVE mg/dL
Leukocytes, UA: NEGATIVE
Nitrite: NEGATIVE
PH: 5.5 (ref 5.0–8.0)
PROTEIN: NEGATIVE mg/dL
Specific Gravity, Urine: 1.02 (ref 1.005–1.030)
Urobilinogen, UA: 0.2 mg/dL (ref 0.0–1.0)

## 2013-10-08 LAB — RAPID URINE DRUG SCREEN, HOSP PERFORMED
AMPHETAMINES: NOT DETECTED
BENZODIAZEPINES: NOT DETECTED
Barbiturates: NOT DETECTED
Cocaine: NOT DETECTED
OPIATES: NOT DETECTED
TETRAHYDROCANNABINOL: NOT DETECTED

## 2013-10-08 LAB — CBC WITH DIFFERENTIAL/PLATELET
Basophils Absolute: 0 10*3/uL (ref 0.0–0.1)
Basophils Relative: 0 % (ref 0–1)
EOS PCT: 1 % (ref 0–5)
Eosinophils Absolute: 0.1 10*3/uL (ref 0.0–0.7)
HEMATOCRIT: 38.5 % — AB (ref 39.0–52.0)
HEMOGLOBIN: 12.5 g/dL — AB (ref 13.0–17.0)
LYMPHS PCT: 17 % (ref 12–46)
Lymphs Abs: 1.3 10*3/uL (ref 0.7–4.0)
MCH: 23.9 pg — ABNORMAL LOW (ref 26.0–34.0)
MCHC: 32.5 g/dL (ref 30.0–36.0)
MCV: 73.8 fL — ABNORMAL LOW (ref 78.0–100.0)
MONOS PCT: 9 % (ref 3–12)
Monocytes Absolute: 0.7 10*3/uL (ref 0.1–1.0)
NEUTROS ABS: 5.7 10*3/uL (ref 1.7–7.7)
Neutrophils Relative %: 73 % (ref 43–77)
Platelets: 345 10*3/uL (ref 150–400)
RBC: 5.22 MIL/uL (ref 4.22–5.81)
RDW: 15.4 % (ref 11.5–15.5)
WBC: 7.8 10*3/uL (ref 4.0–10.5)

## 2013-10-08 LAB — PRO B NATRIURETIC PEPTIDE: Pro B Natriuretic peptide (BNP): 913.2 pg/mL — ABNORMAL HIGH (ref 0–125)

## 2013-10-08 LAB — TROPONIN I: Troponin I: <0.3 ng/mL (ref <0.30)

## 2013-10-08 MED ORDER — SODIUM CHLORIDE 0.9 % IJ SOLN
3.0000 mL | Freq: Two times a day (BID) | INTRAMUSCULAR | Status: DC
  Administered 2013-10-08 – 2013-10-09 (×2): 3 mL via INTRAVENOUS

## 2013-10-08 MED ORDER — SODIUM CHLORIDE 0.9 % IJ SOLN: 3.0000 mL | INTRAMUSCULAR | Status: DC | PRN

## 2013-10-08 MED ORDER — ACETAMINOPHEN 325 MG PO TABS: 650.0000 mg | ORAL_TABLET | Freq: Four times a day (QID) | ORAL | Status: DC | PRN

## 2013-10-08 MED ORDER — SODIUM CHLORIDE 0.9 % IJ SOLN
3.0000 mL | Freq: Two times a day (BID) | INTRAMUSCULAR | Status: DC
  Administered 2013-10-10: 3 mL via INTRAVENOUS

## 2013-10-08 MED ORDER — DOCUSATE SODIUM 100 MG PO CAPS
100.0000 mg | ORAL_CAPSULE | Freq: Two times a day (BID) | ORAL | Status: DC
  Administered 2013-10-09 – 2013-10-10 (×3): 100 mg via ORAL
  Filled 2013-10-08 (×5): qty 1

## 2013-10-08 MED ORDER — LISINOPRIL 20 MG PO TABS
20.0000 mg | ORAL_TABLET | Freq: Every day | ORAL | Status: DC
  Administered 2013-10-09: 20 mg via ORAL
  Filled 2013-10-08: qty 1

## 2013-10-08 MED ORDER — FAMOTIDINE 20 MG PO TABS
20.0000 mg | ORAL_TABLET | Freq: Two times a day (BID) | ORAL | Status: DC
  Administered 2013-10-09 – 2013-10-10 (×3): 20 mg via ORAL
  Filled 2013-10-08 (×5): qty 1

## 2013-10-08 MED ORDER — ONDANSETRON HCL 4 MG PO TABS: 4.0000 mg | ORAL_TABLET | Freq: Four times a day (QID) | ORAL | Status: DC | PRN

## 2013-10-08 MED ORDER — TAMSULOSIN HCL 0.4 MG PO CAPS
0.4000 mg | ORAL_CAPSULE | Freq: Every day | ORAL | Status: DC
  Administered 2013-10-08 – 2013-10-09 (×2): 0.4 mg via ORAL
  Filled 2013-10-08 (×2): qty 1

## 2013-10-08 MED ORDER — ACETAMINOPHEN 650 MG RE SUPP: 650.0000 mg | Freq: Four times a day (QID) | RECTAL | Status: DC | PRN

## 2013-10-08 MED ORDER — ISOSORB DINITRATE-HYDRALAZINE 20-37.5 MG PO TABS
1.0000 | ORAL_TABLET | Freq: Two times a day (BID) | ORAL | Status: DC
  Administered 2013-10-08 – 2013-10-10 (×4): 1 via ORAL
  Filled 2013-10-08 (×5): qty 1

## 2013-10-08 MED ORDER — CARVEDILOL 25 MG PO TABS
50.0000 mg | ORAL_TABLET | Freq: Two times a day (BID) | ORAL | Status: DC
  Administered 2013-10-08 – 2013-10-09 (×2): 50 mg via ORAL
  Filled 2013-10-08 (×4): qty 2

## 2013-10-08 MED ORDER — ADULT MULTIVITAMIN W/MINERALS CH
1.0000 | ORAL_TABLET | Freq: Every day | ORAL | Status: DC
  Administered 2013-10-09 – 2013-10-10 (×2): 1 via ORAL
  Filled 2013-10-08 (×2): qty 1

## 2013-10-08 MED ORDER — HEPARIN SODIUM (PORCINE) 5000 UNIT/ML IJ SOLN: 5000.0000 [IU] | Freq: Three times a day (TID) | INTRAMUSCULAR | Status: DC

## 2013-10-08 MED ORDER — SODIUM CHLORIDE 0.9 % IV SOLN: 250.0000 mL | INTRAVENOUS | Status: DC | PRN

## 2013-10-08 MED ORDER — APIXABAN 5 MG PO TABS
5.0000 mg | ORAL_TABLET | Freq: Two times a day (BID) | ORAL | Status: DC
  Administered 2013-10-08 – 2013-10-10 (×4): 5 mg via ORAL
  Filled 2013-10-08 (×5): qty 1

## 2013-10-08 MED ORDER — ONDANSETRON HCL 4 MG/2ML IJ SOLN: 4.0000 mg | Freq: Four times a day (QID) | INTRAMUSCULAR | Status: DC | PRN

## 2013-10-08 MED ORDER — FUROSEMIDE 40 MG PO TABS
40.0000 mg | ORAL_TABLET | Freq: Two times a day (BID) | ORAL | Status: DC
  Administered 2013-10-08 – 2013-10-09 (×2): 40 mg via ORAL
  Filled 2013-10-08 (×4): qty 1

## 2013-10-08 NOTE — ED Notes (Signed)
ATTEMPTED TO CALL REPORT 

## 2013-10-08 NOTE — ED Notes (Signed)
Patient has returned from radiology. He continues to be alert and oriented. Skin warm and dry. He is requesting refreshments for his family, ed tech in to accomodate

## 2013-10-08 NOTE — ED Notes (Signed)
Patient transported to CT 

## 2013-10-08 NOTE — ED Provider Notes (Signed)
CSN: [id number]     Arrival date & time 10/08/13  1114 History   First MD Initiated Contact with Patient 10/08/13 1121     Chief Complaint  Patient presents with  . Loss of Consciousness  . 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 nonobstructive CAD on [facility] 09/28/12 who presented to the ED for syncopal episode.  Patient was at a restaurant this morning, walked to get food and lost consciousness. This was witnessed by [employer] patrons. He had a second episode of loss of consciousness shortly thereafter. He doesn't know for how long he lost consciousness. He denies any headache, chest pain, shortness of breath, dizziness or palpitations prior to the event. He denies any urinary or bowel incontinence or any description of body shaking. One of the witnesses mentioned he hit his head at the time.  He denies any recent alcohol or drug ingestion. He takes his prescribed medications regularly. He has been  Currently denies any headache, cp or sob. He has mild right sided shoulder discomfort.   He was recently admitted for chest pain, a-fib with RVR and heart failure with an EF of 25-30% on LHC. He had a TEE and was cardioverted back to sinus rhythm and started on coreg and eliquis.     Past Medical History  Diagnosis Date  . Hypertension   . Anemia     Microcytic   . SVT (supraventricular tachycardia)   . Hypercholesteremia   . Asthma   . Chronic upper back pain   . Old myocardial infarct     /H&P 04/09/2012 (05/06/2012)  . Coronary artery disease   . CHF (congestive heart failure)    Past Surgical History  Procedure Laterality Date  . Supraventricular tachycardia ablation  05/06/2012  . Excisional hemorrhoidectomy  ~ 2007  . Cardiac catheterization  04/08/2012  . Tee without cardioversion N/A 09/27/2013    Procedure: TRANSESOPHAGEAL ECHOCARDIOGRAM (TEE);  Surgeon: Vesta Mixer,  MD;  Location: [facility] ENDOSCOPY;  Service: Cardiovascular;  Laterality: N/A;  . Cardioversion N/A 09/27/2013    Procedure: CARDIOVERSION;  Surgeon: Vesta Mixer, MD;  Location: [facility] ENDOSCOPY;  Service: Cardiovascular;  Laterality: N/A;   History reviewed. No pertinent family history. History  Substance Use Topics  . Smoking status: Former Smoker -- 4 years    Types: Cigarettes  . Smokeless tobacco: Never Used     Comment: 05/06/2012 "quit in the 1990's; never smoked much"  . Alcohol Use: 1.2 oz/week    2 Cans of beer per week    Review of Systems  Constitutional: Negative for fever, appetite change and fatigue.  HENT: Negative for rhinorrhea and sore throat.   Eyes: Negative for visual disturbance.  Respiratory: Negative for cough, chest tightness, shortness of breath and wheezing.   Cardiovascular: Negative for chest pain, palpitations and leg swelling.  Gastrointestinal: Negative for abdominal pain and diarrhea.  Genitourinary: Negative for dysuria and frequency.  Musculoskeletal:       Mild right shoulder pain  Skin: Negative for rash.  Neurological: Positive for syncope. Negative for dizziness, seizures, weakness, light-headedness and headaches.  Psychiatric/Behavioral: Negative for confusion.      Allergies  Review of patient's allergies indicates no known allergies.  Home Medications   Prior to Admission medications   Medication Sig Start Date End Date Taking? Authorizing Provider  apixaban (ELIQUIS) 5 MG TABS tablet Take 1 tablet (5  mg total) by mouth 2 (two) times daily. 09/29/13   Brittainy Simmons, PA-C  apixaban (ELIQUIS) 5 MG TABS tablet Take 1 tablet (5 mg total) by mouth 2 (two) times daily. 09/29/13   Brittainy Simmons, PA-C  carvedilol (COREG) 25 MG tablet Take 2 tablets by mouth twice a day 07/22/13   Marinus Maw, MD  famotidine (PEPCID) 20 MG tablet Take 1 tablet (20 mg total) by mouth 2 (two) times daily. 04/09/12   Russella Dar, NP  furosemide (LASIX) 40  MG tablet Take 1 tablet (40 mg total) by mouth 2 (two) times daily. 09/29/13   Brittainy Simmons, PA-C  isosorbide-hydrALAZINE (BIDIL) 20-37.5 MG per tablet Take 1 tablet by mouth 2 (two) times daily. 09/29/13   Brittainy Simmons, PA-C  lisinopril (PRINIVIL,ZESTRIL) 20 MG tablet Take 1 tablet (20 mg total) by mouth daily. 09/29/13   Brittainy Sharol Harness, PA-C  Tamsulosin HCl (FLOMAX) 0.4 MG CAPS Take 0.4 mg by mouth daily.    Historical Provider, MD   BP 128/91  Pulse 72  Temp(Src) 97.3 F (36.3 C) (Oral)  Resp 16  Ht 6\' 2"  (1.88 m)  Wt 183 lb 4 oz (83.122 kg)  BMI 23.52 kg/m2  SpO2 97% Physical Exam Physical Examination: General appearance - alert, well appearing, and in no distress Eyes - pupils equal and reactive, extraocular eye movements intact Mouth - mucous membranes moist, pharynx normal without lesions Neck - supple, no significant adenopathy Chest - clear to auscultation, no wheezes, rales or rhonchi, symmetric air entry Heart - normal rate, regular rhythm, normal S1, S2, no murmurs, rubs, clicks or gallops Abdomen - soft, nontender, nondistended, no masses or organomegaly Extremities - radial pulse normal, no lower extremity swelling Neuro - CN2-12 grossly intact, peripheral vision normal, normal grip strength bilaterally, normal finger to nose, 5/5 strength in lower extremities bilaterally  ED Course  Procedures (including critical care time) Labs Review Labs Reviewed  CBC WITH DIFFERENTIAL - Abnormal; Notable for the following:    Hemoglobin 12.5 (*)    HCT 38.5 (*)    MCV 73.8 (*)    MCH 23.9 (*)    All other components within normal limits  BASIC METABOLIC PANEL - Abnormal; Notable for the following:    Glucose, Bld 120 (*)    GFR calc non Af Amer 78 (*)    All other components within normal limits  PRO B NATRIURETIC PEPTIDE - Abnormal; Notable for the following:    Pro B Natriuretic peptide (BNP) 913.2 (*)    All other components within normal limits  TROPONIN I   URINALYSIS, ROUTINE W REFLEX MICROSCOPIC  URINE RAPID DRUG SCREEN (HOSP PERFORMED)    Imaging Review Dg Chest 2 View  10/08/2013   CLINICAL DATA:  Loss of consciousness.  EXAM: CHEST  2 VIEW  COMPARISON:  Sep 24, 2013.  FINDINGS: Stable cardiomediastinal silhouette. No pneumothorax or pleural effusion is noted. No acute pulmonary disease is noted. Bony thorax is intact.  IMPRESSION: No acute cardiopulmonary abnormality seen.   Electronically Signed   By: Roque Lias M.D.   On: 10/08/2013 12:40   Ct Head Wo Contrast  10/08/2013   CLINICAL DATA:  Syncope, fall  EXAM: CT HEAD WITHOUT CONTRAST  TECHNIQUE: Contiguous axial images were obtained from the base of the skull through the vertex without intravenous contrast.  COMPARISON:  05/25/2008  FINDINGS: No skull fracture is noted. Paranasal sinuses and mastoid air cells are unremarkable.  No intracranial hemorrhage, mass effect or midline shift.  No acute infarction. No mass lesion is noted on this unenhanced scan. The gray and white-matter differentiation is preserved. No hydrocephalus.  IMPRESSION: No acute intracranial abnormality.   Electronically Signed   By: Natasha MeadLiviu  Pop M.D.   On: 10/08/2013 12:45     EKG Interpretation   Date/Time:  Saturday Oct 08 2013 11:22:23 EDT Ventricular Rate:  68 PR Interval:  172 QRS Duration: 132 QT Interval:  423 QTC Calculation: 450 R Axis:   -49 Text Interpretation:  Sinus rhythm Left bundle branch block No significant  change since last tracing Confirmed by BEATON  MD, ROBERT ([id number]) on  10/08/2013 11:31:46 AM      MDM   Final diagnoses:  Syncope  Nonischemic cardiomyopathy - EF 25-30%. nonobstructive CAD on LHC 09/28/12  HTN (hypertension)    63 yo male with h/o non ischemic cardiomyopathy and EF of 25-30%, atrial fibrillation s/p cardioversion who presents with syncopal event. Head CT negative. Etiology of syncope: hypotension vs arrhythmia in setting of reduced EF in patient without AICD.  -  called for admission by cardiology for observation and evaluation.   Marena ChancyStephanie Tiny Chaudhary, PGY-3 Family Medicine Resident     Lonia SkinnerStephanie E Bralen Wiltgen, MD 10/08/13 [phone]

## 2013-10-08 NOTE — ED Notes (Signed)
Lying flat

## 2013-10-08 NOTE — ED Notes (Signed)
Patient transported to X-ray 

## 2013-10-08 NOTE — H&P (Signed)
Referring Physician: ER-Dr. Malva Brewer is an 63 y.o. male.                       Chief Complaint: Syncope HPI: 64 year old male with dilated cardiomyopathy, passed out x 2 while waiting at a restaurant. Patient denies palpitations prior to fainting spell. He was sweaty but alert and oriented. No chest pain or diabetes mellitus. 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 Non-obstructive CAD and EF of 25-30 %. No alcohol or drug use.  Past Medical History  Diagnosis Date  . Hypertension   . Anemia     Microcytic   . SVT (supraventricular tachycardia)   . Hypercholesteremia   . Asthma   . Chronic upper back pain   . Old myocardial infarct     /H&P 04/09/2012 (05/06/2012)  . Coronary artery disease   . CHF (congestive heart failure)       Past Surgical History  Procedure Laterality Date  . Supraventricular tachycardia ablation  05/06/2012  . Excisional hemorrhoidectomy  ~ 2007  . Cardiac catheterization  04/08/2012  . Tee without cardioversion N/A 09/27/2013    Procedure: TRANSESOPHAGEAL ECHOCARDIOGRAM (TEE);  Surgeon: Thayer Headings, MD;  Location: [facility];  Service: Cardiovascular;  Laterality: N/A;  . Cardioversion N/A 09/27/2013    Procedure: CARDIOVERSION;  Surgeon: Thayer Headings, MD;  Location: [facility];  Service: Cardiovascular;  Laterality: N/A;    History reviewed. No pertinent family history. Social History:  reports that he has quit smoking. His smoking use included Cigarettes. He smoked 0.00 packs per day for 4 years. He has never used smokeless tobacco. He reports that he drinks about 1.2 ounces of alcohol per week. He reports that he uses illicit drugs (Marijuana).  Allergies: No Known Allergies   (Not in a hospital admission)  Results for orders placed during the hospital encounter of 10/08/13 (from the past 48 hour(s))  CBC WITH DIFFERENTIAL     Status: Abnormal   Collection  Time    10/08/13 12:00 PM      Result Value Ref Range   WBC 7.8  4.0 - 10.5 K/uL   RBC 5.22  4.22 - 5.81 MIL/uL   Hemoglobin 12.5 (*) 13.0 - 17.0 g/dL   HCT 38.5 (*) 39.0 - 52.0 %   MCV 73.8 (*) 78.0 - 100.0 fL   MCH 23.9 (*) 26.0 - 34.0 pg   MCHC 32.5  30.0 - 36.0 g/dL   RDW 15.4  11.5 - 15.5 %   Platelets 345  150 - 400 K/uL   Neutrophils Relative % 73  43 - 77 %   Neutro Abs 5.7  1.7 - 7.7 K/uL   Lymphocytes Relative 17  12 - 46 %   Lymphs Abs 1.3  0.7 - 4.0 K/uL   Monocytes Relative 9  3 - 12 %   Monocytes Absolute 0.7  0.1 - 1.0 K/uL   Eosinophils Relative 1  0 - 5 %   Eosinophils Absolute 0.1  0.0 - 0.7 K/uL   Basophils Relative 0  0 - 1 %   Basophils Absolute 0.0  0.0 - 0.1 K/uL  BASIC METABOLIC PANEL     Status: Abnormal   Collection Time    10/08/13 12:00 PM      Result Value Ref Range   Sodium 138  137 - 147 mEq/L   Potassium 4.0  3.7 - 5.3  mEq/L   Chloride 101  96 - 112 mEq/L   CO2 25  19 - 32 mEq/L   Glucose, Bld 120 (*) 70 - 99 mg/dL   BUN 21  6 - 23 mg/dL   Creatinine, Ser 1.01  0.50 - 1.35 mg/dL   Calcium 9.1  8.4 - 10.5 mg/dL   GFR calc non Af Amer 78 (*) >90 mL/min   GFR calc Af Amer >90  >90 mL/min   Comment: (NOTE)     The eGFR has been calculated using the CKD EPI equation.     This calculation has not been validated in all clinical situations.     eGFR's persistently <90 mL/min signify possible Chronic Kidney     Disease.  PRO B NATRIURETIC PEPTIDE     Status: Abnormal   Collection Time    10/08/13 12:00 PM      Result Value Ref Range   Pro B Natriuretic peptide (BNP) 913.2 (*) 0 - 125 pg/mL  TROPONIN I     Status: None   Collection Time    10/08/13 12:00 PM      Result Value Ref Range   Troponin I <0.30  <0.30 ng/mL   Comment:            Due to the release kinetics of cTnI,     a negative result within the first hours     of the onset of symptoms does not rule out     myocardial infarction with certainty.     If myocardial infarction is  still suspected,     repeat the test at appropriate intervals.  URINALYSIS, ROUTINE W REFLEX MICROSCOPIC     Status: None   Collection Time    10/08/13 12:17 PM      Result Value Ref Range   Color, Urine YELLOW  YELLOW   APPearance CLEAR  CLEAR   Specific Gravity, Urine 1.020  1.005 - 1.030   pH 5.5  5.0 - 8.0   Glucose, UA NEGATIVE  NEGATIVE mg/dL   Hgb urine dipstick NEGATIVE  NEGATIVE   Bilirubin Urine NEGATIVE  NEGATIVE   Ketones, ur NEGATIVE  NEGATIVE mg/dL   Protein, ur NEGATIVE  NEGATIVE mg/dL   Urobilinogen, UA 0.2  0.0 - 1.0 mg/dL   Nitrite NEGATIVE  NEGATIVE   Leukocytes, UA NEGATIVE  NEGATIVE   Comment: MICROSCOPIC NOT DONE ON URINES WITH NEGATIVE PROTEIN, BLOOD, LEUKOCYTES, NITRITE, OR GLUCOSE <1000 mg/dL.  URINE RAPID DRUG SCREEN (HOSP PERFORMED)     Status: None   Collection Time    10/08/13 12:18 PM      Result Value Ref Range   Opiates NONE DETECTED  NONE DETECTED   Cocaine NONE DETECTED  NONE DETECTED   Benzodiazepines NONE DETECTED  NONE DETECTED   Amphetamines NONE DETECTED  NONE DETECTED   Tetrahydrocannabinol NONE DETECTED  NONE DETECTED   Barbiturates NONE DETECTED  NONE DETECTED   Comment:            DRUG SCREEN FOR MEDICAL PURPOSES     ONLY.  IF CONFIRMATION IS NEEDED     FOR ANY PURPOSE, NOTIFY LAB     WITHIN 5 DAYS.                LOWEST DETECTABLE LIMITS     FOR URINE DRUG SCREEN     Drug Class       Cutoff (ng/mL)     Amphetamine      1000  Barbiturate      200     Benzodiazepine   998     Tricyclics       338     Opiates          300     Cocaine          300     THC              50   Dg Chest 2 View  10/08/2013   CLINICAL DATA:  Loss of consciousness.  EXAM: CHEST  2 VIEW  COMPARISON:  Sep 24, 2013.  FINDINGS: Stable cardiomediastinal silhouette. No pneumothorax or pleural effusion is noted. No acute pulmonary disease is noted. Bony thorax is intact.  IMPRESSION: No acute cardiopulmonary abnormality seen.   Electronically Signed   By:  Sabino Dick M.D.   On: 10/08/2013 12:40   Ct Head Wo Contrast  10/08/2013   CLINICAL DATA:  Syncope, fall  EXAM: CT HEAD WITHOUT CONTRAST  TECHNIQUE: Contiguous axial images were obtained from the base of the skull through the vertex without intravenous contrast.  COMPARISON:  05/25/2008  FINDINGS: No skull fracture is noted. Paranasal sinuses and mastoid air cells are unremarkable.  No intracranial hemorrhage, mass effect or midline shift.  No acute infarction. No mass lesion is noted on this unenhanced scan. The gray and white-matter differentiation is preserved. No hydrocephalus.  IMPRESSION: No acute intracranial abnormality.   Electronically Signed   By: Lahoma Crocker M.D.   On: 10/08/2013 12:45   Review of Systems  Constitutional: Negative for fever, chills, diaphoresis, appetite change and fatigue.  HENT: Negative for mouth sores, sore throat and trouble swallowing.  Eyes: Negative for visual disturbance.  Respiratory: Negative for cough, chest tightness, shortness of breath and wheezing.  Cardiovascular: Negative for chest pain.  Gastrointestinal: Positive for nausea. Negative for vomiting, abdominal pain, diarrhea and abdominal distention.  Endocrine: Negative for polydipsia, polyphagia and polyuria.  Genitourinary: Negative for dysuria, frequency and hematuria.  Musculoskeletal: Positive for back pain. Negative for gait problem.  Skin: Negative for color change, pallor and rash.  Neurological: Negative for dizziness, syncope, light-headedness and headaches.  Hematological: Does not bruise/bleed easily.  Psychiatric/Behavioral: Negative for behavioral problems and confusion.    Blood pressure 119/73, pulse 66, temperature 97.3 F (36.3 C), temperature source Oral, resp. rate 19, height $RemoveBe'6\' 2"'EuEVzHIcE$  (1.88 m), weight 83.122 kg (183 lb 4 oz), SpO2 99.00%.  Physical Exam  Constitutional: He is oriented to person, place, and time. He appears well-developed and poorly-nourished. No distress.   HENT: Head: Normocephalic. Eyes: Conjunctivae are normal. Pupils are equal, round, and reactive to light. No scleral icterus.  Neck: Normal range of motion. Neck supple. No thyromegaly present. No JVD or bruits  Cardiovascular: Normal rate, regular rhythm, S1 normal and S2 normal. Exam reveals no gallop and no friction rub. II/VI systolic murmur.  Pulmonary/Chest: Effort normal and breath sounds normal.  Abdominal: Soft. Bowel sounds are mormal. There is no tenderness. There is no rebound.  Musculoskeletal: Normal range of motion.  Neurological: He is alert and oriented to person, place, and time.  Skin: Skin is warm and dry. No rash noted.  Psychiatric: He has a normal mood and affect. His behavior is normal.  Assessment/Plan Syncope H/O SVT H/O Atrial fibrillation Chronic systolic heart failure CAD H/O tobacco use disorder H/O alcohol use disorder  Admit Monitor Echocardiogram, limited. Home medications  Birdie Riddle 10/08/2013, 2:27 PM

## 2013-10-08 NOTE — ED Notes (Signed)
Patient in line at [employer]. Had 2 witnessed syncopal events. First time it is reported he "passed out and hit his right shoulder on a railing. It is not known by pt or ems if pt fell to floor. Time unconscious unknown. Had second witnessed syncopal event at resturant. Bystander reportedly witnessed pt and helped him to the ground. Pt states he knows he passed out. States does not remember feeling faint or different before episodes. ems reports pt sweaty but alert and oriented on their arrival

## 2013-10-09 LAB — CBC
HCT: 36.8 % — ABNORMAL LOW (ref 39.0–52.0)
HEMOGLOBIN: 11.9 g/dL — AB (ref 13.0–17.0)
MCH: 23.8 pg — ABNORMAL LOW (ref 26.0–34.0)
MCHC: 32.3 g/dL (ref 30.0–36.0)
MCV: 73.5 fL — ABNORMAL LOW (ref 78.0–100.0)
PLATELETS: 339 10*3/uL (ref 150–400)
RBC: 5.01 MIL/uL (ref 4.22–5.81)
RDW: 15.6 % — ABNORMAL HIGH (ref 11.5–15.5)
WBC: 8.2 10*3/uL (ref 4.0–10.5)

## 2013-10-09 LAB — GLUCOSE, CAPILLARY
Glucose-Capillary: 90 mg/dL (ref 70–99)
Glucose-Capillary: 113 mg/dL — ABNORMAL HIGH (ref 70–99)
Glucose-Capillary: 108 mg/dL — ABNORMAL HIGH (ref 70–99)

## 2013-10-09 LAB — PROTIME-INR
INR: 1.31 (ref 0.00–1.49)
PROTHROMBIN TIME: 16 s — AB (ref 11.6–15.2)

## 2013-10-09 LAB — BASIC METABOLIC PANEL
BUN: 17 mg/dL (ref 6–23)
CO2: 24 mEq/L (ref 19–32)
Calcium: 8.9 mg/dL (ref 8.4–10.5)
Chloride: 104 mEq/L (ref 96–112)
Creatinine, Ser: 0.97 mg/dL (ref 0.50–1.35)
GFR, EST NON AFRICAN AMERICAN: 87 mL/min — AB (ref >90)
Glucose, Bld: 99 mg/dL (ref 70–99)
POTASSIUM: 3.8 meq/L (ref 3.7–5.3)
SODIUM: 139 meq/L (ref 137–147)

## 2013-10-09 MED ORDER — TAMSULOSIN HCL 0.4 MG PO CAPS
0.4000 mg | ORAL_CAPSULE | Freq: Every day | ORAL | Status: DC
  Filled 2013-10-09 (×2): qty 1

## 2013-10-09 MED ORDER — CARVEDILOL 25 MG PO TABS
25.0000 mg | ORAL_TABLET | Freq: Two times a day (BID) | ORAL | Status: DC
  Administered 2013-10-09 – 2013-10-10 (×2): 25 mg via ORAL
  Filled 2013-10-09 (×4): qty 1

## 2013-10-09 MED ORDER — LISINOPRIL 10 MG PO TABS
10.0000 mg | ORAL_TABLET | Freq: Every day | ORAL | Status: DC
  Administered 2013-10-10: 10 mg via ORAL
  Filled 2013-10-09: qty 1

## 2013-10-09 MED ORDER — FUROSEMIDE 40 MG PO TABS
40.0000 mg | ORAL_TABLET | Freq: Every day | ORAL | Status: DC
  Administered 2013-10-10: 40 mg via ORAL
  Filled 2013-10-09: qty 1

## 2013-10-09 NOTE — Discharge Instructions (Signed)
Information on my medicine - ELIQUIS® (apixaban) ° °This medication education was reviewed with me or my healthcare representative as part of my discharge preparation.  The pharmacist that spoke with me during my hospital stay was:  Talissa Apple C Lesa Vandall, RPH ° °Why was Eliquis® prescribed for you? °Eliquis® was prescribed for you to reduce the risk of a blood clot forming that can cause a stroke if you have a medical condition called atrial fibrillation (a type of irregular heartbeat). ° °What do You need to know about Eliquis® ? °Take your Eliquis® TWICE DAILY - one tablet in the morning and one tablet in the evening with or without food. If you have difficulty swallowing the tablet whole please discuss with your pharmacist how to take the medication safely. ° °Take Eliquis® exactly as prescribed by your doctor and DO NOT stop taking Eliquis® without talking to the doctor who prescribed the medication.  Stopping may increase your risk of developing a stroke.  Refill your prescription before you run out. ° °After discharge, you should have regular check-up appointments with your healthcare provider that is prescribing your Eliquis®.  In the future your dose may need to be changed if your kidney function or weight changes by a significant amount or as you get older. ° °What do you do if you miss a dose? °If you miss a dose, take it as soon as you remember on the same day and resume taking twice daily.  Do not take more than one dose of ELIQUIS at the same time to make up a missed dose. ° °Important Safety Information °A possible side effect of Eliquis® is bleeding. You should call your healthcare provider right away if you experience any of the following: °  Bleeding from an injury or your nose that does not stop. °  Unusual colored urine (red or dark brown) or unusual colored stools (red or black). °  Unusual bruising for unknown reasons. °  A serious fall or if you hit your head (even if there is no bleeding). ° °Some  medicines may interact with Eliquis® and might increase your risk of bleeding or clotting while on Eliquis®. To help avoid this, consult your healthcare provider or pharmacist prior to using any new prescription or non-prescription medications, including herbals, vitamins, non-steroidal anti-inflammatory drugs (NSAIDs) and supplements. ° °This website has more information on Eliquis® (apixaban): www.Eliquis.com. °

## 2013-10-09 NOTE — ED Provider Notes (Signed)
I saw and evaluated the patient, reviewed the resident's note and I agree with the findings and plan.   .Face to face Exam:  General:  Awake HEENT:  Atraumatic Resp:  Normal effort Abd:  Nondistended Neuro:No focal weakness  I reviewed and agree with the EKG interpretation with the resident  Nelia Shi, MD 10/09/13 [phone]

## 2013-10-09 NOTE — Progress Notes (Signed)
  Echocardiogram 2D Echocardiogram has been performed.  Cody Brewer 10/09/2013, 3:27 PM

## 2013-10-09 NOTE — Progress Notes (Signed)
Ref:  Duane Lopeoss, Alan, MD   Subjective:  Feeling better. Patient thinks he took all his medications at one time and instead of resting 1/2 hour like usual, he ran to a restaurant and passed out.  Objective:  Vital Signs in the last 24 hours: Temp:  [97.9 F (36.6 C)-98.6 F (37 C)] 98.6 F (37 C) (05/24 1425) Pulse Rate:  [67-76] 72 (05/24 1425) Cardiac Rhythm:  [-] Normal sinus rhythm (05/23 2015) Resp:  [16-18] 18 (05/24 1425) BP: (122-178)/(76-100) 124/80 mmHg (05/24 1425) SpO2:  [97 %-99 %] 99 % (05/24 1425) Weight:  [83.961 kg (185 lb 1.6 oz)] 83.961 kg (185 lb 1.6 oz) (05/24 0509)  Physical Exam: BP Readings from Last 1 Encounters:  10/09/13 124/80    Wt Readings from Last 1 Encounters:  10/09/13 83.961 kg (185 lb 1.6 oz)    Weight change:   HEENT: [place]/AT, Eyes-Brown, PERL, EOMI, Conjunctiva-Pink, Sclera-Non-icteric Neck: No JVD, No bruit, Trachea midline. Lungs:  Clear, Bilateral. Cardiac:  Regular rhythm, normal S1 and S2, no S3. II/VI systolic and diastolic murmur. Abdomen:  Soft, non-tender. Extremities:  No edema present. No cyanosis. No clubbing. CNS: AxOx3, Cranial nerves grossly intact, moves all 4 extremities. Right handed. Skin: Warm and dry.   Intake/Output from previous day:      Lab Results: BMET    Component Value Date/Time   NA 139 10/09/2013 0400   NA 138 10/08/2013 1200   NA 138 09/29/2013 0700   K 3.8 10/09/2013 0400   K 4.0 10/08/2013 1200   K 4.1 09/29/2013 0700   CL 104 10/09/2013 0400   CL 101 10/08/2013 1200   CL 103 09/29/2013 0700   CO2 24 10/09/2013 0400   CO2 25 10/08/2013 1200   CO2 21 09/29/2013 0700   GLUCOSE 99 10/09/2013 0400   GLUCOSE 120* 10/08/2013 1200   GLUCOSE 95 09/29/2013 0700   BUN 17 10/09/2013 0400   BUN 21 10/08/2013 1200   BUN 9 09/29/2013 0700   CREATININE 0.97 10/09/2013 0400   CREATININE 1.01 10/08/2013 1200   CREATININE 0.76 09/29/2013 0700   CALCIUM 8.9 10/09/2013 0400   CALCIUM 9.1 10/08/2013 1200   CALCIUM 8.9 09/29/2013  0700   GFRNONAA 87* 10/09/2013 0400   GFRNONAA 78* 10/08/2013 1200   GFRNONAA >90 09/29/2013 0700   GFRAA >90 10/09/2013 0400   GFRAA >90 10/08/2013 1200   GFRAA >90 09/29/2013 0700   CBC    Component Value Date/Time   WBC 8.2 10/09/2013 0400   RBC 5.01 10/09/2013 0400   HGB 11.9* 10/09/2013 0400   HCT 36.8* 10/09/2013 0400   PLT 339 10/09/2013 0400   MCV 73.5* 10/09/2013 0400   MCH 23.8* 10/09/2013 0400   MCHC 32.3 10/09/2013 0400   RDW 15.6* 10/09/2013 0400   LYMPHSABS 1.3 10/08/2013 1200   MONOABS 0.7 10/08/2013 1200   EOSABS 0.1 10/08/2013 1200   BASOSABS 0.0 10/08/2013 1200   HEPATIC Function Panel No results found for this basename: PROT,  ALBUMIN,  AST,  ALT,  ALKPHOS,  BILIDIR,  IBILI,  in the last 8760 hours HEMOGLOBIN A1C No components found with this basename: HGA1C,  MPG   CARDIAC ENZYMES Lab Results  Component Value Date   CKTOTAL 292* 04/09/2012   CKMB 4.8* 04/09/2012   TROPONINI <0.30 10/08/2013   TROPONINI <0.30 09/26/2013   TROPONINI <0.30 09/25/2013   BNP  Recent Labs  09/24/13 2130 10/08/13 1200  PROBNP 2724.0* 913.2*   TSH  Recent Labs  09/24/13  2130  TSH 1.630   CHOLESTEROL  Recent Labs  09/25/13 0417  CHOL 141    Scheduled Meds: . apixaban  5 mg Oral BID  . carvedilol  25 mg Oral BID WC  . docusate sodium  100 mg Oral BID  . famotidine  20 mg Oral BID  . [START ON 10/10/2013] furosemide  40 mg Oral Daily  . isosorbide-hydrALAZINE  1 tablet Oral BID  . [START ON 10/10/2013] lisinopril  10 mg Oral Daily  . multivitamin with minerals  1 tablet Oral Daily  . sodium chloride  3 mL Intravenous Q12H  . sodium chloride  3 mL Intravenous Q12H  . [START ON 10/10/2013] tamsulosin  0.4 mg Oral QPC supper   Continuous Infusions:  PRN Meds:.sodium chloride, acetaminophen, acetaminophen, ondansetron (ZOFRAN) IV, ondansetron, sodium chloride  Assessment/Plan: Syncope  H/O SVT  H/O Atrial fibrillation  Chronic systolic heart failure  CAD  H/O tobacco  use disorder  H/O alcohol use disorder  Decrease medication dosages by 50 %. Increase activity as tolerated.  Nuclear stress test in AM if possible.   LOS: 1 day    Orpah Cobb  MD  10/09/2013, 2:49 PM

## 2013-10-10 ENCOUNTER — Inpatient Hospital Stay (HOSPITAL_COMMUNITY): Payer: 59

## 2013-10-10 HISTORY — PX: CARDIOVASCULAR STRESS TEST: SHX262

## 2013-10-10 MED ORDER — CARVEDILOL 25 MG PO TABS: 25.0000 mg | ORAL_TABLET | Freq: Two times a day (BID) | ORAL | Status: DC

## 2013-10-10 MED ORDER — REGADENOSON 0.4 MG/5ML IV SOLN
INTRAVENOUS | Status: AC
  Administered 2013-10-10: 0.4 mg via INTRAVENOUS
  Filled 2013-10-10: qty 5

## 2013-10-10 MED ORDER — FUROSEMIDE 40 MG PO TABS: 40.0000 mg | ORAL_TABLET | Freq: Every day | ORAL | Status: DC

## 2013-10-10 MED ORDER — TECHNETIUM TC 99M SESTAMIBI GENERIC - CARDIOLITE
10.0000 | Freq: Once | INTRAVENOUS | Status: AC | PRN
  Administered 2013-10-10: 10 via INTRAVENOUS

## 2013-10-10 MED ORDER — TECHNETIUM TC 99M SESTAMIBI - CARDIOLITE
30.0000 | Freq: Once | INTRAVENOUS | Status: AC | PRN
  Administered 2013-10-10: 30 via INTRAVENOUS

## 2013-10-10 MED ORDER — TAMSULOSIN HCL 0.4 MG PO CAPS: 0.4000 mg | ORAL_CAPSULE | Freq: Every day | ORAL | Status: DC

## 2013-10-10 MED ORDER — LISINOPRIL 20 MG PO TABS: 10.0000 mg | ORAL_TABLET | Freq: Every day | ORAL | Status: DC

## 2013-10-10 MED ORDER — REGADENOSON 0.4 MG/5ML IV SOLN
0.4000 mg | Freq: Once | INTRAVENOUS | Status: AC
Start: 2013-10-10 — End: 2013-10-10
  Administered 2013-10-10: 0.4 mg via INTRAVENOUS

## 2013-10-10 NOTE — Discharge Summary (Signed)
Physician Discharge Summary  Patient ID: Cody Brewer MRN: [id number] DOB/AGE: 62/11/1950 62 y.o.  Admit date: 10/08/2013 Discharge date: 10/10/2013  Admission Diagnoses: Syncope  H/O SVT  H/O Atrial fibrillation  Chronic systolic heart failure  CAD  H/O tobacco use disorder  H/O alcohol use disorder  Discharge Diagnoses:  Principle Problem: * Syncope * Adverse effects of cardiac medications  H/O SVT  H/O Atrial fibrillation  Chronic systolic heart failure  CAD  H/O tobacco use disorder  H/O alcohol use disorder   Discharged Condition: fair  Hospital Course: 63 year old male with dilated cardiomyopathy, passed out x 2 while waiting at a restaurant. Patient denies palpitations prior to fainting spell. He was sweaty but alert and oriented. No chest pain or diabetes mellitus. 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 Non-obstructive CAD and EF of 25-30 %. No alcohol or drug use. Patient thinks he took all his medications at one time and instead of resting 1/2 hour like usual, he ran to a restaurant and passed out.  He underwent nuclear stress test  That showed 1. Anterior wall infarct with some adjacent peri-infarct ischemia, associated wall motion abnormality. 2. Left ventricular dilatation, ejection fraction 35%. His cardiac cath was reviewed. He had 40-50 % 1st. Diagonal branch (less than 2 mm in size) of LAD disease. Most of his cardiac medications doses were decreased and Flomax was moved to bed time intake. He will be followed by his primary care doctor and cardiologist as arranged.  Consults: cardiology  Significant Diagnostic Studies: labs: Near normal CBC except low Hgb of 12.5 and low MCV of 73.8.  Normal Electrolytes and BUN/Cr. Normal Troponin-I.  Nuclear medicine: Showing peri-infarct anterior wall ischemia.  EKG-Sinus rhythm with high lateral ischemia and intraventricular conduction  delay.  Treatments: cardiac meds: lisinopril (Zestril), carvedilol, furosemide and apixaban. Tamsulosin was moved to night time.  Discharge Exam: Blood pressure 151/91, pulse 79, temperature 98 F (36.7 C), temperature source Oral, resp. rate 19, height 6\' 2"  (1.88 m), weight 83.961 kg (185 lb 1.6 oz), SpO2 100.00%. HEENT: [place]/AT, Eyes-Brown, PERL, EOMI, Conjunctiva-Pink, Sclera-Non-icteric  Neck: No JVD, No bruit, Trachea midline.  Lungs: Clear, Bilateral.  Cardiac: Regular rhythm, normal S1 and S2, no S3. II/VI systolic and diastolic murmur.  Abdomen: Soft, non-tender.  Extremities: No edema present. No cyanosis. No clubbing.  CNS: AxOx3, Cranial nerves grossly intact, moves all 4 extremities. Right handed.  Skin: Warm and dry.  Disposition: 01-Home or Self Care     Medication List         apixaban 5 MG Tabs tablet  Commonly known as:  ELIQUIS  Take 1 tablet (5 mg total) by mouth 2 (two) times daily.     carvedilol 25 MG tablet  Commonly known as:  COREG  Take 1 tablet (25 mg total) by mouth 2 (two) times daily with a meal.     famotidine 20 MG tablet  Commonly known as:  PEPCID  Take 1 tablet (20 mg total) by mouth 2 (two) times daily.     furosemide 40 MG tablet  Commonly known as:  LASIX  Take 1 tablet (40 mg total) by mouth daily.     isosorbide-hydrALAZINE 20-37.5 MG per tablet  Commonly known as:  BIDIL  Take 1 tablet by mouth 2 (two) times daily.     lisinopril 20 MG tablet  Commonly known as:  PRINIVIL,ZESTRIL  Take 0.5 tablets (10 mg total) by mouth daily.  tamsulosin 0.4 MG Caps capsule  Commonly known as:  FLOMAX  Take 1 capsule (0.4 mg total) by mouth daily after supper.           Follow-up Information   Follow up with  Duane Lope, MD. Schedule an appointment as soon as possible for a visit in 1 week.   Specialty:  Family Medicine   Contact information:   [address] [place] [address] [phone]       Follow up with  Ricki Rodriguez, MD. (As needed)    Specialty:  Cardiology   Contact information:   [address] [place] [address] [phone]       Signed: Ricki Rodriguez 10/10/2013, 2:20 PM

## 2013-10-10 NOTE — Progress Notes (Signed)
Discharge instructions given. Pt verbalized understanding and all questions were answered.  

## 2013-10-11 ENCOUNTER — Encounter: Payer: 59 | Admitting: Cardiology

## 2013-10-13 ENCOUNTER — Encounter (HOSPITAL_COMMUNITY): Payer: 59

## 2013-10-17 ENCOUNTER — Telehealth: Payer: Self-pay | Admitting: Internal Medicine

## 2013-10-17 NOTE — Telephone Encounter (Signed)
Advised patient's wife ( he was not home) that it is OK to eat and drink tomorrow and take meds.

## 2013-10-17 NOTE — Telephone Encounter (Signed)
Called to confirm tomorrow am appt with Cody Brewer, pt wants a nurse call to discuss what will be involved, if he can eat and drink.. I already told him he could eat, drink and take his meds as normal.

## 2013-10-18 ENCOUNTER — Encounter: Payer: Self-pay | Admitting: Cardiology

## 2013-10-18 ENCOUNTER — Ambulatory Visit (INDEPENDENT_AMBULATORY_CARE_PROVIDER_SITE_OTHER): Payer: 59 | Admitting: Cardiology

## 2013-10-18 ENCOUNTER — Encounter: Payer: 59 | Admitting: Cardiology

## 2013-10-18 VITALS — BP 130/84 | HR 88 | Ht 74.0 in | Wt 192.0 lb

## 2013-10-18 DIAGNOSIS — I5020 Unspecified systolic (congestive) heart failure: Secondary | ICD-10-CM

## 2013-10-18 DIAGNOSIS — R55 Syncope and collapse: Secondary | ICD-10-CM

## 2013-10-18 DIAGNOSIS — I4891 Unspecified atrial fibrillation: Secondary | ICD-10-CM

## 2013-10-18 DIAGNOSIS — I428 Other cardiomyopathies: Secondary | ICD-10-CM

## 2013-10-18 DIAGNOSIS — I5022 Chronic systolic (congestive) heart failure: Secondary | ICD-10-CM

## 2013-10-18 NOTE — Patient Instructions (Signed)
Your Physician recommends you have a Life Vest : (we will contact you pertaning this)  Your physician has requested that you have an echocardiogram in 3 MONTHS . Echocardiography is a painless test that uses sound waves to create images of your heart. It provides your doctor with information about the size and shape of your heart and how well your heart's chambers and valves are working. This procedure takes approximately one hour. There are no restrictions for this procedure.  Your physician recommends that you schedule a follow-up appointment after echo with Dr. Ladona Ridgel

## 2013-10-18 NOTE — Progress Notes (Addendum)
ELECTROPHYSIOLOGY OFFICE NOTE   Patient ID: Cody Brewer MRN: [id number], DOB/AGE: 62-14-1952   Date of Visit: 10/18/2013  Primary Physician:  Duane Lope, MD Primary Cardiologist / EP:  [name], MD Reason for Visit:  Hospital follow-up  History of Present Illness  Cody Brewer is a 63 y.o. 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 He was started on rate control and Eliquis. He underwent TEE-guided DCCV which was successful for restoring SR. Given his new LV dysfunction, he underwent cardiac catheterization which revealed minimal, nonobstructive CAD with severe basal hypokinesis. LVEF 25%. He was already taking Coreg. Lisinopril and BiDil were added. He was discharged 09/29/2013 and presents today for hospital follow-up. He is accompanied by his wife.   Today, he reports he experienced syncope on 10/08/2013. He was ordering a breakfast at [employer] when he abruptly lost consciousness. He tells me he was feeling well, like his usual self with no complaints / concerns. He had already placed his order but was not standing in line for very long. He had no warning as he denies any prodrome. He states, "I was just walking to go sit down and wait on my food then the next thing I know I'm on the floor." He denies CP, SOB or palpitations. He denies dizziness, weakness or lightheadedness. He denies diaphoresis, nausea or vomiting. He denies loss of bowel or bladder control. He tells me when EMS arrived they told him "everything checked out okay and his BP and heart rate were fine" as he recalls being told his BP was 138/91. When he regained consciousness he felt fine and was without any symptoms. He has never experienced syncope before. He was taken to [facility] where he was admitted overnight for observation (only seen by Dr. Algie Coffer as we were never called).   Today,  he denies chest pain or shortness of breath. He denies palpitations, dizziness, near syncope or syncope. He denies LE swelling, orthopnea or PND. He is compliant with medications.  Past Medical History Past Medical History  Diagnosis Date  . Hypertension   . Anemia     Microcytic   . SVT (supraventricular tachycardia)   . Hypercholesteremia   . Asthma   . Chronic upper back pain   . Old myocardial infarct     /H&P 04/09/2012 (05/06/2012)  . Coronary artery disease   . CHF (congestive heart failure)     Past Surgical History Past Surgical History  Procedure Laterality Date  . Supraventricular tachycardia ablation  05/06/2012  . Excisional hemorrhoidectomy  ~ 2007  . Cardiac catheterization  04/08/2012  . Tee without cardioversion N/A 09/27/2013    Procedure: TRANSESOPHAGEAL ECHOCARDIOGRAM (TEE);  Surgeon: Vesta Mixer, MD;  Location: [facility] ENDOSCOPY;  Service: Cardiovascular;  Laterality: N/A;  . Cardioversion N/A 09/27/2013    Procedure: CARDIOVERSION;  Surgeon: Vesta Mixer, MD;  Location: [facility] ENDOSCOPY;  Service: Cardiovascular;  Laterality: N/A;    Allergies/Intolerances No Known Allergies  Current Home Medications Current Outpatient Prescriptions  Medication Sig Dispense Refill  . apixaban (ELIQUIS) 5 MG TABS tablet Take 1 tablet (5 mg total) by mouth 2 (two) times daily.  60 tablet  10  . carvedilol (COREG) 25 MG tablet Take 1 tablet (25 mg total) by mouth 2 (two) times daily with a meal.      . famotidine (PEPCID) 20 MG tablet Take  1 tablet (20 mg total) by mouth 2 (two) times daily.  60 tablet  1  . furosemide (LASIX) 40 MG tablet Take 1 tablet (40 mg total) by mouth daily.  30 tablet  5  . isosorbide-hydrALAZINE (BIDIL) 20-37.5 MG per tablet Take 1 tablet by mouth 2 (two) times daily.  60 tablet  5  . lisinopril (PRINIVIL,ZESTRIL) 20 MG tablet Take 0.5 tablets (10 mg total) by mouth daily.  30 tablet  5  . tamsulosin (FLOMAX) 0.4 MG CAPS capsule Take 1 capsule (0.4 mg  total) by mouth daily after supper.  30 capsule     No current facility-administered medications for this visit.    Social History History   Social History  . Marital Status: Married    Spouse Name: N/A    Number of Children: N/A  . Years of Education: N/A   Occupational History  . Not on file.   Social History Main Topics  . Smoking status: Former Smoker -- 4 years    Types: Cigarettes  . Smokeless tobacco: Never Used     Comment: 05/06/2012 "quit in the 1990's; never smoked much"  . Alcohol Use: 1.2 oz/week    2 Cans of beer per week  . Drug Use: Yes    Special: Marijuana     Comment: 05/06/2012 "when I was younger I probably  smoked pot"  . Sexual Activity: Not Currently   Other Topics Concern  . Not on file   Social History Narrative  . No narrative on file     Review of Systems General: No chills, fever, night sweats or weight changes Cardiovascular: No chest pain, dyspnea on exertion, edema, orthopnea, palpitations, paroxysmal nocturnal dyspnea Dermatological: No rash, lesions or masses Respiratory: No cough, dyspnea Urologic: No hematuria, dysuria Abdominal: No nausea, vomiting, diarrhea, bright red blood per rectum, melena, or hematemesis Neurologic: No visual changes, weakness, changes in mental status All other systems reviewed and are otherwise negative except as noted above.  Physical Exam Vitals: Blood pressure 130/84, pulse 88, height 6\' 2"  (1.88 m), weight 192 lb (87.091 kg).  General: Well developed, well appearing 63 y.o. male in no acute distress. HEENT: Normocephalic, atraumatic. EOMs intact. Sclera nonicteric. Oropharynx clear.  Neck: Supple without bruits. No JVD. Lungs: Respirations regular and unlabored, CTA bilaterally. No wheezes, rales or rhonchi. Heart: RRR. S1, S2 present. No murmurs, rub, S3 or S4. Abdomen: Soft, non-tender, non-distended. BS present x 4 quadrants. No hepatosplenomegaly.  Extremities: No clubbing, cyanosis or edema.  PT/Radials 2+ and equal bilaterally. Psych: Normal affect. Neuro: Alert and oriented X 3. Moves all extremities spontaneously.   Diagnostics  Recent labs    Component Value Date/Time   WBC 8.2 10/09/2013 0400   RBC 5.01 10/09/2013 0400   HGB 11.9* 10/09/2013 0400   HCT 36.8* 10/09/2013 0400   PLT 339 10/09/2013 0400   MCV 73.5* 10/09/2013 0400   MCH 23.8* 10/09/2013 0400   MCHC 32.3 10/09/2013 0400   RDW 15.6* 10/09/2013 0400   LYMPHSABS 1.3 10/08/2013 1200   MONOABS 0.7 10/08/2013 1200   EOSABS 0.1 10/08/2013 1200   BASOSABS 0.0 10/08/2013 1200      Component Value Date/Time   NA 139 10/09/2013 0400   K 3.8 10/09/2013 0400   CL 104 10/09/2013 0400   CO2 24 10/09/2013 0400   GLUCOSE 99 10/09/2013 0400   BUN 17 10/09/2013 0400   CREATININE 0.97 10/09/2013 0400   CALCIUM 8.9 10/09/2013 0400   GFRNONAA 87* 10/09/2013 0400  GFRAA >90 10/09/2013 0400  Magnesium not done   Echocardiogram 09/27/2013 Study Conclusions - Left ventricle: The cavity size was normal. Wall thickness was increased in a pattern of mild LVH. Systolic function was moderately to severely reduced. The estimated ejection fraction was in the range of 30% to 35%. There is akinesis of the mid-distalanteroseptal myocardium. - Aortic valve: Trivial regurgitation. - Mitral valve: Mild regurgitation. - Left atrium: The atrium was moderately dilated. - Right ventricle: The cavity size was mildly dilated. Wall thickness was normal. - Right atrium: The atrium was mildly dilated. Impressions: - When compared to prior, EF is reduced.  Cardiac catheterization 09/28/2013 Procedural Findings:  Hemodynamics  RA 14 mmHg  RV 40/3 mmHg  PA 41/20 mmHg  PCWP 22 mmHg  LV 149/13 mmHg . LVEDP: 24 mmHg  AO 150/103 mmHg  Oxygen saturations:  PA 64%  AO 92%  Cardiac Output (Fick) 5.87  Cardiac Index (Fick) 2.73  Aortic Valve: Peak to Peak gradient: Not significant  Pulmonary vascular resistance (PVR): 2 Woods units.  Coronary  angiography:  Coronary dominance: Right  Left Main: Normal  Left Anterior Descending (LAD): Normal in size with minor irregularities distally.  1st diagonal (D1): Normal in size with 20% proximal stenosis.  2nd diagonal (D2): Large in size with no significant disease.  3rd diagonal (D3): Normal in size with minor irregularities.  Circumflex (LCx): Normal in size and nondominant. The vessel has no significant disease.  1st obtuse marginal: Small in size with no significant disease.  2nd obtuse marginal: Normal in size with no significant disease.  3rd obtuse marginal: Medium in size with no significant disease.  AV groove continuation segment: Normal.  Right Coronary Artery: Normal in size and dominant. There is 20% proximal stenosis. The rest of the vessel is free of significant disease.  Posterior descending artery: Normal in size with no significant disease.  Posterior AV segment: Normal  Posterolateral branchs: Normal Left ventriculography: Left ventricular systolic function is severely reduced , LVEF is estimated at 25-30 %, there is no significant mitral regurgitation . There is severe hypokinesis in the basal segments.  Final Conclusions:  1. Moderately elevated filling pressures with normal cardiac output  2. No evidence of obstructive coronary artery disease with only minor irregularities.  3. Severely reduced LV systolic function with an ejection fraction of 25-30%.  Recommendations:  The patient has nonischemic cardiomyopathy which could be tachycardia induced or due to hypertensive heart disease. I started lisinopril and Lasix. Consider spironolactone. Heparin to be in resumed 8 hours after sheath pull given his recent cardioversion. Oral anticoagulation can be started tomorrow morning. Possible discharge in the morning.   12-lead ECG today - NSR at 88 bpm with IVCD; PR 178 QRS 130 QTc 457; unchanged from previous ECGs from May 2015 Review of available telemetry strips in EPIC  from recent hospital observation for syncope - all show SR; no arrhythmia  Orthostatic vital signs today  Lying BP 135/76 pulse 73  Sitting BP 134/76 pulse 77  Standing - 0 min - BP 110/74 pulse 91        - 2 min - BP 148/92 pulse 91  Assessment and Plan Syncope NICM, EF 30-35%, newly diagnosed, possibly tachy-mediated Chronic systolic HF Paroxysmal atrial fibrillation, newly diagnosed  The etiology for Mr. Klecka's syncope is unclear at this time. However, given his abrupt LOC without warning or prodrome in setting of LV dysfunction, I am concerned it may be arrhythmogenic due to VT. I have recommended he  wear a LifeVest. He will continue guideline-directed medical therapy for cardiomyopathy including Coreg, lisinopril, hydralazine and nitrates. He is euvolemic. He was counseled at length regarding the importance of low sodium diet. He is maintaining SR and will continue Eliquis for stroke prevention. He was instructed not to drive. He will return for echo in 3 months to reassess LVEF. He will follow-up with Dr. Ladona Ridgel in 3 months.  This plan of care was reviewed with Dr. Ladona Ridgel who is in agreement. Signed, Herby Abraham Khalea Ventura, PA-C 10/18/2013, 1:30 PM

## 2014-01-24 ENCOUNTER — Other Ambulatory Visit (HOSPITAL_COMMUNITY): Payer: 59

## 2014-01-31 ENCOUNTER — Ambulatory Visit: Payer: 59 | Admitting: Cardiology

## 2014-01-31 ENCOUNTER — Ambulatory Visit (HOSPITAL_COMMUNITY): Payer: 59 | Attending: Cardiology

## 2014-01-31 DIAGNOSIS — I482 Chronic atrial fibrillation, unspecified: Secondary | ICD-10-CM

## 2014-01-31 DIAGNOSIS — E785 Hyperlipidemia, unspecified: Secondary | ICD-10-CM | POA: Diagnosis not present

## 2014-01-31 DIAGNOSIS — I4891 Unspecified atrial fibrillation: Secondary | ICD-10-CM | POA: Diagnosis not present

## 2014-01-31 DIAGNOSIS — I10 Essential (primary) hypertension: Secondary | ICD-10-CM | POA: Insufficient documentation

## 2014-01-31 DIAGNOSIS — I5020 Unspecified systolic (congestive) heart failure: Secondary | ICD-10-CM

## 2014-01-31 NOTE — Progress Notes (Signed)
2D Echo completed. 01/31/2014 

## 2014-02-06 ENCOUNTER — Ambulatory Visit (INDEPENDENT_AMBULATORY_CARE_PROVIDER_SITE_OTHER): Payer: 59 | Admitting: Internal Medicine

## 2014-02-06 ENCOUNTER — Encounter: Payer: Self-pay | Admitting: Internal Medicine

## 2014-02-06 VITALS — BP 124/80 | HR 79 | Ht 74.0 in | Wt 199.4 lb

## 2014-02-06 DIAGNOSIS — I480 Paroxysmal atrial fibrillation: Secondary | ICD-10-CM

## 2014-02-06 DIAGNOSIS — I4891 Unspecified atrial fibrillation: Secondary | ICD-10-CM

## 2014-02-06 DIAGNOSIS — I5022 Chronic systolic (congestive) heart failure: Secondary | ICD-10-CM

## 2014-02-06 DIAGNOSIS — I2510 Atherosclerotic heart disease of native coronary artery without angina pectoris: Secondary | ICD-10-CM

## 2014-02-06 DIAGNOSIS — I428 Other cardiomyopathies: Secondary | ICD-10-CM

## 2014-02-06 NOTE — Progress Notes (Signed)
HPI [name] returns today for followup. He is a very pleasant 63 year old man with an extensive past medical history. He underwent catheter ablation of SVT approximately 2 years ago. He had recurrent episodes of shortness of breath, which was his equivalent for symptomatic SVT, and was subsequently found to have atrial fibrillation. Echocardiography demonstrated left ventricular dysfunction. He was placed on maximal medical therapy. He had an episode of syncope, which was not thought to be arrhythmic and was placed on a life vest. He subsequently stopped his life vest on his own. The patient had up titration of his medical therapy, cardioversion, and repeat echo has demonstrated normalization of his left ventricular function. He feels well. He has had no recurrent syncope. He denies peripheral edema. He admits to sodium indiscretion. No Known Allergies   Current Outpatient Prescriptions  Medication Sig Dispense Refill  . apixaban (ELIQUIS) 5 MG TABS tablet Take 1 tablet (5 mg total) by mouth 2 (two) times daily.  60 tablet  10  . carvedilol (COREG) 25 MG tablet Take 1 tablet (25 mg total) by mouth 2 (two) times daily with a meal.      . famotidine (PEPCID) 20 MG tablet Take 1 tablet (20 mg total) by mouth 2 (two) times daily.  60 tablet  1  . furosemide (LASIX) 40 MG tablet Take 1 tablet (40 mg total) by mouth daily.  30 tablet  5  . isosorbide-hydrALAZINE (BIDIL) 20-37.5 MG per tablet Take 1 tablet by mouth 2 (two) times daily.  60 tablet  5  . lisinopril (PRINIVIL,ZESTRIL) 20 MG tablet Take 0.5 tablets (10 mg total) by mouth daily.  30 tablet  5  . tamsulosin (FLOMAX) 0.4 MG CAPS capsule Take 1 capsule (0.4 mg total) by mouth daily after supper.  30 capsule     No current facility-administered medications for this visit.     Past Medical History  Diagnosis Date  . Hypertension   . Anemia     Microcytic   . SVT (supraventricular tachycardia)   . Hypercholesteremia   .  Asthma   . Chronic upper back pain   . Old myocardial infarct     /H&P 04/09/2012 (05/06/2012)  . Coronary artery disease   . CHF (congestive heart failure)     ROS:   All systems reviewed and negative except as noted in the HPI.   Past Surgical History  Procedure Laterality Date  . Supraventricular tachycardia ablation  05/06/2012  . Excisional hemorrhoidectomy  ~ 2007  . Cardiac catheterization  04/08/2012  . Tee without cardioversion N/A 09/27/2013    Procedure: TRANSESOPHAGEAL ECHOCARDIOGRAM (TEE);  Surgeon: Vesta Mixer, MD;  Location: [facility] ENDOSCOPY;  Service: Cardiovascular;  Laterality: N/A;  . Cardioversion N/A 09/27/2013    Procedure: CARDIOVERSION;  Surgeon: Vesta Mixer, MD;  Location: [facility] ENDOSCOPY;  Service: Cardiovascular;  Laterality: N/A;     History reviewed. No pertinent family history.   History   Social History  . Marital Status: Married    Spouse Name: N/A    Number of Children: N/A  . Years of Education: N/A   Occupational History  . Not on file.   Social History Main Topics  . Smoking status: Former Smoker -- 4 years    Types: Cigarettes  . Smokeless tobacco: Never Used     Comment: 05/06/2012 "quit in the 1990's; never smoked much"  . Alcohol Use: 1.2 oz/week    2 Cans of beer per week  .  Drug Use: Yes    Special: Marijuana     Comment: 05/06/2012 "when I was younger I probably  smoked pot"  . Sexual Activity: Not Currently   Other Topics Concern  . Not on file   Social History Narrative  . No narrative on file     BP 124/80  Pulse 79  Ht 6\' 2"  (1.88 m)  Wt 199 lb 6.4 oz (90.447 kg)  BMI 25.59 kg/m2  Physical Exam:  Well appearing 63 year old man, NAD HEENT: Unremarkable Neck:  No JVD, no thyromegally Back:  No CVA tenderness Lungs:  Clear with no wheezes, rales, or rhonchi. HEART:  Regular rate rhythm, no murmurs, no rubs, no clicks Abd:  soft, positive bowel sounds, no organomegally, no rebound, no guarding Ext:  2  plus pulses, no edema, no cyanosis, no clubbing Skin:  No rashes no nodules Neuro:  CN II through XII intact, motor grossly intact  EKG - normal sinus rhythm with left ventricular hypertrophy, QRS duration 132 ms.   Assess/Plan:

## 2014-02-06 NOTE — Assessment & Plan Note (Signed)
Repeat echo demonstrates normalization of his left ventricular function. He will continue his current medications for now.

## 2014-02-06 NOTE — Assessment & Plan Note (Signed)
He appears to be maintaining sinus rhythm very nicely. He will continue systemic anticoagulation.

## 2014-02-06 NOTE — Patient Instructions (Addendum)
Your physician recommends that you continue on your current medications as directed. Please refer to the Current Medication list given to you today.  Your physician wants you to follow-up in: 6 months with Dr. Taylor.  You will receive a reminder letter in the mail two months in advance. If you don't receive a letter, please call our office to schedule the follow-up appointment.  

## 2014-02-06 NOTE — Assessment & Plan Note (Signed)
His ejection fraction has normalized. He will continue his current medical therapy. We discussed the importance of a low-sodium diet.

## 2014-03-25 ENCOUNTER — Other Ambulatory Visit: Payer: Self-pay | Admitting: Cardiology

## 2014-03-31 ENCOUNTER — Other Ambulatory Visit: Payer: 59

## 2014-04-24 ENCOUNTER — Other Ambulatory Visit: Payer: Self-pay | Admitting: Family Medicine

## 2014-04-24 DIAGNOSIS — E041 Nontoxic single thyroid nodule: Secondary | ICD-10-CM

## 2014-04-25 ENCOUNTER — Telehealth: Payer: Self-pay | Admitting: Internal Medicine

## 2014-04-25 MED ORDER — ISOSORBIDE MONONITRATE ER 30 MG PO TB24: 30.0000 mg | ORAL_TABLET | Freq: Every day | ORAL | Status: DC

## 2014-04-25 MED ORDER — HYDRALAZINE HCL 25 MG PO TABS: 25.0000 mg | ORAL_TABLET | Freq: Two times a day (BID) | ORAL | Status: DC

## 2014-04-25 NOTE — Telephone Encounter (Signed)
Spoke with patient and as there is a notional back order on Bidil I will call in Hydralazine and Imdur for him per Dr Lubertha Basque recommendation.  Patient aware

## 2014-04-25 NOTE — Telephone Encounter (Signed)
Follow Up        Pt calling to talk to a nurse about Eliquis. Please call and advise.

## 2014-04-25 NOTE — Telephone Encounter (Signed)
New Message  [facility]  Imaging wants clearance to stop Eliquis for 48 hours for thyroid Biopsy.  Please call back and discuss.

## 2014-04-25 NOTE — Telephone Encounter (Signed)
Discussed with Dr. Ladona Ridgel, may stop Eliquis 48 hours prior to procedure.

## 2014-04-25 NOTE — Telephone Encounter (Signed)
Aware to hold for 48 hours prior

## 2014-04-25 NOTE — Telephone Encounter (Signed)
New message     Talk to [facility] before you leave today

## 2014-04-27 ENCOUNTER — Encounter (HOSPITAL_COMMUNITY): Payer: Self-pay | Admitting: Cardiovascular Disease

## 2014-05-02 ENCOUNTER — Ambulatory Visit
Admission: RE | Admit: 2014-05-02 | Discharge: 2014-05-02 | Disposition: A | Discharge status: Home / Self Care | Payer: 59 | Source: Ambulatory Visit | Attending: Family Medicine | Admitting: Family Medicine

## 2014-05-02 ENCOUNTER — Other Ambulatory Visit (HOSPITAL_COMMUNITY)
Admission: RE | Admit: 2014-05-02 | Discharge: 2014-05-02 | Disposition: A | Discharge status: Home / Self Care | Payer: 59 | Source: Ambulatory Visit | Attending: Interventional Radiology | Admitting: Interventional Radiology

## 2014-05-02 DIAGNOSIS — E041 Nontoxic single thyroid nodule: Secondary | ICD-10-CM | POA: Insufficient documentation

## 2014-05-02 DIAGNOSIS — E042 Nontoxic multinodular goiter: Secondary | ICD-10-CM | POA: Diagnosis present

## 2014-05-31 ENCOUNTER — Ambulatory Visit (INDEPENDENT_AMBULATORY_CARE_PROVIDER_SITE_OTHER): Payer: 59 | Admitting: Internal Medicine

## 2014-05-31 ENCOUNTER — Encounter: Payer: Self-pay | Admitting: Internal Medicine

## 2014-05-31 VITALS — BP 150/106 | HR 79 | Ht 73.5 in | Wt 196.4 lb

## 2014-05-31 DIAGNOSIS — I5022 Chronic systolic (congestive) heart failure: Secondary | ICD-10-CM

## 2014-05-31 DIAGNOSIS — I480 Paroxysmal atrial fibrillation: Secondary | ICD-10-CM

## 2014-05-31 DIAGNOSIS — I10 Essential (primary) hypertension: Secondary | ICD-10-CM

## 2014-05-31 MED ORDER — CARVEDILOL 25 MG PO TABS: 37.5000 mg | ORAL_TABLET | Freq: Two times a day (BID) | ORAL | Status: DC

## 2014-05-31 NOTE — Assessment & Plan Note (Signed)
His blood pressure is elevated. He will uptitrate his dose of coreg to 37.5 mg twice daily. He will stop hydralazine as he could not tolerate. I asked him to reduce his sodium intake.

## 2014-05-31 NOTE — Patient Instructions (Signed)
Your physician has recommended you make the following change in your medication:  1) STOP Hydralazine 2) STOP Imdur 3) INCREASE Coreg to 37.5 mg (one and half tablets) twice a day with meals.   Your physician wants you to follow-up in: 1 year with Dr. Ladona Ridgel. You will receive a reminder letter in the mail two months in advance. If you don't receive a letter, please call our office to schedule the follow-up appointment.

## 2014-05-31 NOTE — Assessment & Plan Note (Signed)
He has had no symptomatic atrial fib. He and I discussed systemic anti-coagulation. He would like to stop his NOAC and I have recommended he continue his Eliquis.

## 2014-05-31 NOTE — Assessment & Plan Note (Signed)
He will maintain a low fat diet. 

## 2014-05-31 NOTE — Assessment & Plan Note (Signed)
His EF has normalized. Will consider repeat echo when I see him again in 6 months. He will continue his current meds.

## 2014-05-31 NOTE — Progress Notes (Signed)
HPI Mr. Payton returns today for followup. He is a very pleasant 64 year old man with an extensive past medical history. He underwent catheter ablation of SVT approximately 2.5 years ago. He had recurrent episodes of shortness of breath, which was his equivalent for symptomatic SVT, and was subsequently found to have atrial fibrillation. Echocardiography demonstrated left ventricular dysfunction. He was placed on maximal medical therapy. He had an episode of syncope, which was not thought to be arrhythmic and was placed on a life vest. He subsequently stopped his life vest on his own. The patient had up titration of his medical therapy, cardioversion, and repeat echo has demonstrated normalization of his left ventricular function. He feels well. He has had no recurrent syncope. He denies peripheral edema. He admits to sodium indiscretion. He has not been able to tolerate hydralazine/Imdur. His blood pressure has not been well controlled. He does not have dyspnea. No Known Allergies   Current Outpatient Prescriptions  Medication Sig Dispense Refill  . apixaban (ELIQUIS) 5 MG TABS tablet Take 1 tablet (5 mg total) by mouth 2 (two) times daily. 60 tablet 10  . carvedilol (COREG) 25 MG tablet Take 1.5 tablets (37.5 mg total) by mouth 2 (two) times daily with a meal. 270 tablet 3  . famotidine (PEPCID) 20 MG tablet Take 1 tablet (20 mg total) by mouth 2 (two) times daily. 60 tablet 1  . furosemide (LASIX) 40 MG tablet Take 1 tablet (40 mg total) by mouth daily. 30 tablet 5  . lisinopril (PRINIVIL,ZESTRIL) 20 MG tablet Take 0.5 tablets (10 mg total) by mouth daily. 30 tablet 5  . tamsulosin (FLOMAX) 0.4 MG CAPS capsule Take 1 capsule (0.4 mg total) by mouth daily after supper. 30 capsule    No current facility-administered medications for this visit.     Past Medical History  Diagnosis Date  . Hypertension   . Anemia     Microcytic   . SVT (supraventricular tachycardia)   .  Hypercholesteremia   . Asthma   . Chronic upper back pain   . Old myocardial infarct     /H&P 04/09/2012 (05/06/2012)  . Coronary artery disease   . CHF (congestive heart failure)     ROS:   All systems reviewed and negative except as noted in the HPI.   Past Surgical History  Procedure Laterality Date  . Supraventricular tachycardia ablation  05/06/2012  . Excisional hemorrhoidectomy  ~ 2007  . Cardiac catheterization  04/08/2012  . Tee without cardioversion N/A 09/27/2013    Procedure: TRANSESOPHAGEAL ECHOCARDIOGRAM (TEE);  Surgeon: Vesta Mixer, MD;  Location: [facility] ENDOSCOPY;  Service: Cardiovascular;  Laterality: N/A;  . Cardioversion N/A 09/27/2013    Procedure: CARDIOVERSION;  Surgeon: Vesta Mixer, MD;  Location: [facility] ENDOSCOPY;  Service: Cardiovascular;  Laterality: N/A;  . Left heart catheterization with coronary angiogram N/A 04/08/2012    Procedure: LEFT HEART CATHETERIZATION WITH CORONARY ANGIOGRAM;  Surgeon: Kathleene Hazel, MD;  Location: [facility] CATH LAB;  Service: Cardiovascular;  Laterality: N/A;  . Supraventricular tachycardia ablation N/A 05/06/2012    Procedure: SUPRAVENTRICULAR TACHYCARDIA ABLATION;  Surgeon: Marinus Maw, MD;  Location: [facility] CATH LAB;  Service: Cardiovascular;  Laterality: N/A;  . Left and right heart catheterization with coronary angiogram N/A 09/28/2013    Procedure: LEFT AND RIGHT HEART CATHETERIZATION WITH CORONARY ANGIOGRAM;  Surgeon: Iran Ouch, MD;  Location: [facility] CATH LAB;  Service: Cardiovascular;  Laterality: N/A;     No family history on file.  History   Social History  . Marital Status: Married    Spouse Name: N/A    Number of Children: N/A  . Years of Education: N/A   Occupational History  . Not on file.   Social History Main Topics  . Smoking status: Former Smoker -- 4 years    Types: Cigarettes  . Smokeless tobacco: Never Used     Comment: 05/06/2012 "quit in the 1990's; never smoked much"  . Alcohol Use:  1.2 oz/week    2 Cans of beer per week  . Drug Use: Yes    Special: Marijuana     Comment: 05/06/2012 "when I was younger I probably  smoked pot"  . Sexual Activity: Not Currently   Other Topics Concern  . Not on file   Social History Narrative     BP 150/106 mmHg  Pulse 79  Ht 6' 1.5" (1.867 m)  Wt 196 lb 6.4 oz (89.086 kg)  BMI 25.56 kg/m2  Physical Exam:  Well appearing 64 year old man, NAD HEENT: Unremarkable Neck:  No JVD, no thyromegally Back:  No CVA tenderness Lungs:  Clear with no wheezes, rales, or rhonchi. HEART:  Regular rate rhythm, no murmurs, no rubs, no clicks, soft s4 Abd:  soft, positive bowel sounds, no organomegally, no rebound, no guarding Ext:  2 plus pulses, no edema, no cyanosis, no clubbing Skin:  No rashes no nodules Neuro:  CN II through XII intact, motor grossly intact    Assess/Plan:

## 2014-07-27 ENCOUNTER — Other Ambulatory Visit: Payer: Self-pay | Admitting: Gastroenterology

## 2014-08-11 ENCOUNTER — Other Ambulatory Visit: Payer: Self-pay | Admitting: Cardiology

## 2014-08-11 MED ORDER — FUROSEMIDE 40 MG PO TABS: 40.0000 mg | ORAL_TABLET | Freq: Every day | ORAL | Status: DC

## 2014-08-25 ENCOUNTER — Other Ambulatory Visit: Payer: Self-pay | Admitting: Cardiology

## 2014-09-30 ENCOUNTER — Other Ambulatory Visit: Payer: Self-pay | Admitting: Cardiology

## 2014-12-29 ENCOUNTER — Other Ambulatory Visit: Payer: Self-pay | Admitting: Internal Medicine

## 2015-01-02 ENCOUNTER — Other Ambulatory Visit: Payer: Self-pay | Admitting: *Deleted

## 2015-01-02 ENCOUNTER — Telehealth: Payer: Self-pay | Admitting: *Deleted

## 2015-01-02 NOTE — Telephone Encounter (Signed)
Patients wife called for a refill of the bidil. At one point the pharmacy was out of the bidil and he was given the two separate medications. She stated that at his last office visit they discussed with Dr Ladona Ridgel that he was unable to take the hydralazine and imdur separately. When the pharmacy got the bidil back in stock, they began refilling this for him again. Please advise. Thanks, MI

## 2015-01-04 ENCOUNTER — Other Ambulatory Visit: Payer: Self-pay | Admitting: *Deleted

## 2015-01-04 MED ORDER — ISOSORB DINITRATE-HYDRALAZINE 20-37.5 MG PO TABS: 1.0000 | ORAL_TABLET | Freq: Two times a day (BID) | ORAL | Status: DC

## 2015-01-04 NOTE — Telephone Encounter (Signed)
Ok to fill bidil

## 2015-01-04 NOTE — Telephone Encounter (Signed)
Rx for Bidil sent in for patient.

## 2015-04-03 ENCOUNTER — Encounter: Payer: Self-pay | Admitting: Internal Medicine

## 2015-04-03 ENCOUNTER — Ambulatory Visit (INDEPENDENT_AMBULATORY_CARE_PROVIDER_SITE_OTHER): Payer: 59 | Admitting: Internal Medicine

## 2015-04-03 VITALS — BP 132/100 | HR 69 | Ht 73.5 in | Wt 195.0 lb

## 2015-04-03 DIAGNOSIS — I10 Essential (primary) hypertension: Secondary | ICD-10-CM | POA: Diagnosis not present

## 2015-04-03 DIAGNOSIS — I480 Paroxysmal atrial fibrillation: Secondary | ICD-10-CM

## 2015-04-03 MED ORDER — APIXABAN 5 MG PO TABS: 5.0000 mg | ORAL_TABLET | Freq: Two times a day (BID) | ORAL | Status: DC

## 2015-04-03 MED ORDER — LISINOPRIL 20 MG PO TABS: 10.0000 mg | ORAL_TABLET | Freq: Every day | ORAL | Status: DC

## 2015-04-03 MED ORDER — CARVEDILOL 25 MG PO TABS: 37.5000 mg | ORAL_TABLET | Freq: Two times a day (BID) | ORAL | Status: DC

## 2015-04-03 NOTE — Patient Instructions (Addendum)
Medication Instructions:  Your physician recommends that you continue on your current medications as directed. Please refer to the Current Medication list given to you today.  Refills sent in for your Carvedilol, Eliquis, & Lisinopril  Labwork: None ordered  Testing/Procedures: None ordered  Follow-Up: Your physician wants you to follow-up in: 6 months with Dr. Ladona Ridgel.  You will receive a reminder letter in the mail two months in advance. If you don't receive a letter, please call our office to schedule the follow-up appointment.   If you need a refill on your cardiac medications before your next appointment, please call your pharmacy.  Thank you for choosing [facility]!!

## 2015-04-03 NOTE — Assessment & Plan Note (Signed)
His blood pressure is elevated today. He admits to sodium indiscretion. I've strongly encouraged the patient to decrease his salt intake.

## 2015-04-03 NOTE — Assessment & Plan Note (Signed)
He has paroxysmal atrial fibrillation. I strongly encourage the patient to take his systemic anticoagulation. He states that he will restart Eliquis.

## 2015-04-03 NOTE — Progress Notes (Signed)
HPI [name] returns today for followup. He is a very pleasant 64 year old man with an extensive past medical history. He underwent catheter ablation of SVT approximately 2.5 years ago. He had recurrent episodes of shortness of breath, which was his equivalent for symptomatic SVT, and was subsequently found to have atrial fibrillation. Echocardiography demonstrated left ventricular dysfunction. He was placed on maximal medical therapy. He had an episode of syncope, which was not thought to be arrhythmic and was placed on a life vest. He subsequently stopped his life vest on his own. The patient had up titration of his medical therapy, cardioversion, and repeat echo has demonstrated normalization of his left ventricular function. He feels well. He has had no recurrent syncope. He denies peripheral edema. He admits to sodium indiscretion. He has not been able to tolerate hydralazine/Imdur. His blood pressure has not been well controlled. He does not have dyspnea. He discontinued his systemic anticoagulation. No Known Allergies   Current Outpatient Prescriptions  Medication Sig Dispense Refill  . carvedilol (COREG) 25 MG tablet Take 1.5 tablets (37.5 mg total) by mouth 2 (two) times daily with a meal. (Patient taking differently: Take 25 mg by mouth 2 (two) times daily with a meal. ) 270 tablet 3  . famotidine (PEPCID) 20 MG tablet Take 1 tablet (20 mg total) by mouth 2 (two) times daily. 60 tablet 1  . furosemide (LASIX) 40 MG tablet Take 1 tablet (40 mg total) by mouth daily. 30 tablet 5  . isosorbide-hydrALAZINE (BIDIL) 20-37.5 MG per tablet Take 1 tablet by mouth 2 (two) times daily. 60 tablet 3  . lisinopril (PRINIVIL,ZESTRIL) 20 MG tablet Take 0.5 tablets (10 mg total) by mouth daily. (Patient taking differently: Take 20 mg by mouth daily. ) 30 tablet 5  . lisinopril (PRINIVIL,ZESTRIL) 20 MG tablet Take 0.5 tablets (10 mg total) by mouth daily. 30 tablet 5  . polyethylene glycol  (MIRALAX / GLYCOLAX) packet Take 17 g by mouth daily as needed for moderate constipation.    . tamsulosin (FLOMAX) 0.4 MG CAPS capsule Take 1 capsule (0.4 mg total) by mouth daily after supper. 30 capsule    No current facility-administered medications for this visit.     Past Medical History  Diagnosis Date  . Hypertension   . Anemia     Microcytic   . SVT (supraventricular tachycardia) (HCC)   . Hypercholesteremia   . Asthma   . Chronic upper back pain   . Old myocardial infarct     /H&P 04/09/2012 (05/06/2012)  . Coronary artery disease   . CHF (congestive heart failure) (HCC)     ROS:   All systems reviewed and negative except as noted in the HPI.   Past Surgical History  Procedure Laterality Date  . Supraventricular tachycardia ablation  05/06/2012  . Excisional hemorrhoidectomy  ~ 2007  . Cardiac catheterization  04/08/2012  . Tee without cardioversion N/A 09/27/2013    Procedure: TRANSESOPHAGEAL ECHOCARDIOGRAM (TEE);  Surgeon: Vesta Mixer, MD;  Location: [facility] ENDOSCOPY;  Service: Cardiovascular;  Laterality: N/A;  . Cardioversion N/A 09/27/2013    Procedure: CARDIOVERSION;  Surgeon: Vesta Mixer, MD;  Location: [facility] ENDOSCOPY;  Service: Cardiovascular;  Laterality: N/A;  . Left heart catheterization with coronary angiogram N/A 04/08/2012    Procedure: LEFT HEART CATHETERIZATION WITH CORONARY ANGIOGRAM;  Surgeon: Kathleene Hazel, MD;  Location: [facility] CATH LAB;  Service: Cardiovascular;  Laterality: N/A;  . Supraventricular tachycardia ablation N/A 05/06/2012    Procedure:  SUPRAVENTRICULAR TACHYCARDIA ABLATION;  Surgeon: Marinus Maw, MD;  Location: [facility] CATH LAB;  Service: Cardiovascular;  Laterality: N/A;  . Left and right heart catheterization with coronary angiogram N/A 09/28/2013    Procedure: LEFT AND RIGHT HEART CATHETERIZATION WITH CORONARY ANGIOGRAM;  Surgeon: Iran Ouch, MD;  Location: [facility] CATH LAB;  Service: Cardiovascular;  Laterality: N/A;      No family history on file.   Social History   Social History  . Marital Status: Married    Spouse Name: N/A  . Number of Children: N/A  . Years of Education: N/A   Occupational History  . Not on file.   Social History Main Topics  . Smoking status: Former Smoker -- 4 years    Types: Cigarettes  . Smokeless tobacco: Never Used     Comment: 05/06/2012 "quit in the 1990's; never smoked much"  . Alcohol Use: 1.2 oz/week    2 Cans of beer per week  . Drug Use: Yes    Special: Marijuana     Comment: 05/06/2012 "when I was younger I probably  smoked pot"  . Sexual Activity: Not Currently   Other Topics Concern  . Not on file   Social History Narrative     BP 132/100 mmHg  Pulse 69  Ht 6' 1.5" (1.867 m)  Wt 195 lb (88.451 kg)  BMI 25.38 kg/m2  SpO2 98%  Physical Exam:  Well appearing 64 year old man, NAD HEENT: Unremarkable Neck:  7 cm JVD, no thyromegally Back:  No CVA tenderness Lungs:  Clear with no wheezes, rales, or rhonchi. HEART:  Regular rate rhythm, no murmurs, no rubs, no clicks, soft s4 Abd:  soft, positive bowel sounds, no organomegally, no rebound, no guarding Ext:  2 plus pulses, no edema, no cyanosis, no clubbing Skin:  No rashes no nodules Neuro:  CN II through XII intact, motor grossly intact  ECG -normal sinus rhythm with an intraventricular conduction delay   Assess/Plan:

## 2015-04-14 ENCOUNTER — Other Ambulatory Visit: Payer: Self-pay | Admitting: Internal Medicine

## 2015-05-20 HISTORY — PX: CATARACT EXTRACTION W/ INTRAOCULAR LENS  IMPLANT, BILATERAL: SHX1307

## 2015-05-28 ENCOUNTER — Other Ambulatory Visit: Payer: Self-pay | Admitting: Internal Medicine

## 2015-06-23 IMAGING — CT CT HEAD W/O CM
1 series · 16 of 30 positions shown, 20 images · non-contrast
Comparison: 05/25/2008

CLINICAL DATA: Syncope, fall

EXAM:
CT HEAD WITHOUT CONTRAST
TECHNIQUE: Contiguous axial images were obtained from the base of the skull
through the vertex without intravenous contrast.

[Series 2: head 5.0 h30s · axial · 0.45mm/px · z∈[-120,+15]mm · 16 of 31 slices shown, 20 images]
[im 2/31  brain]
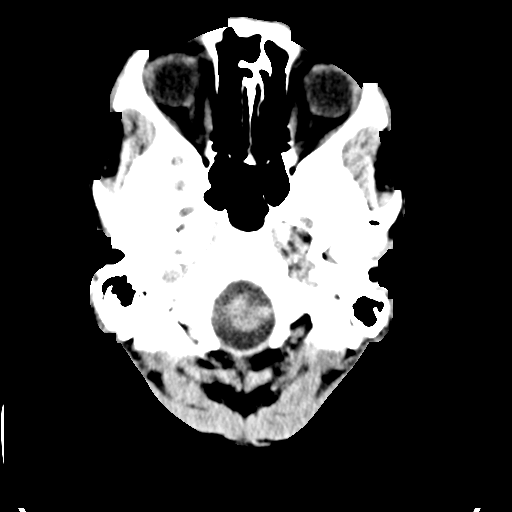
[im 2/31  bone]
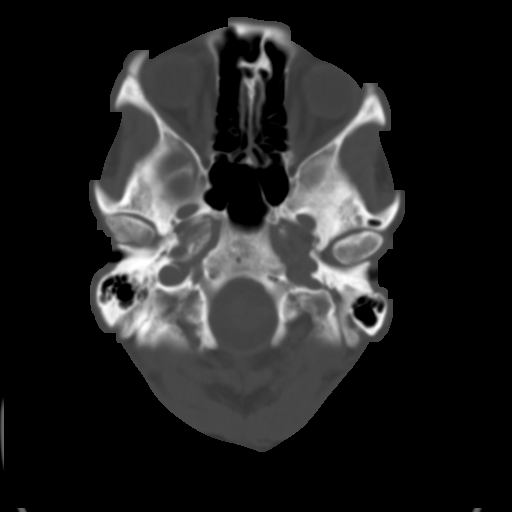
[im 4/31  brain]
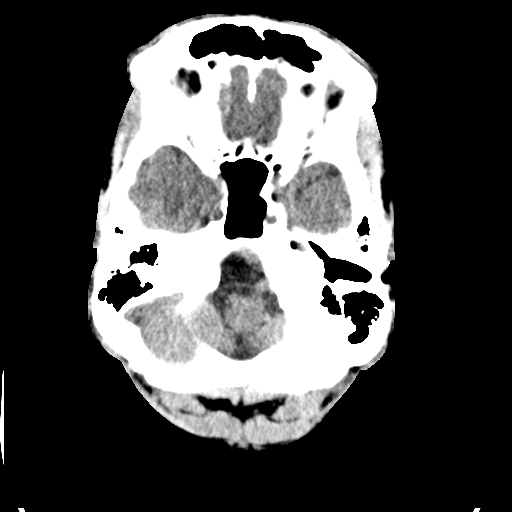
[im 6/31  brain]
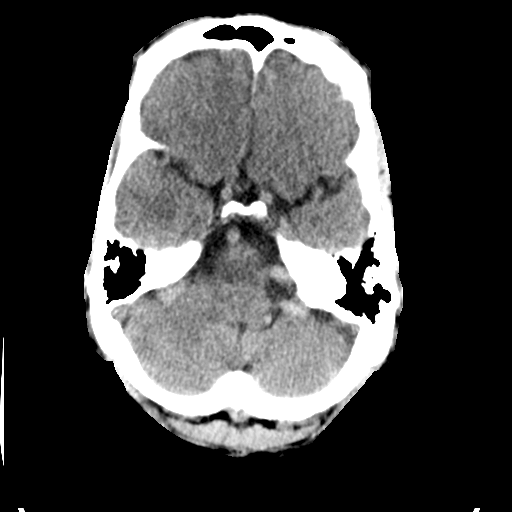
[im 8/31  brain]
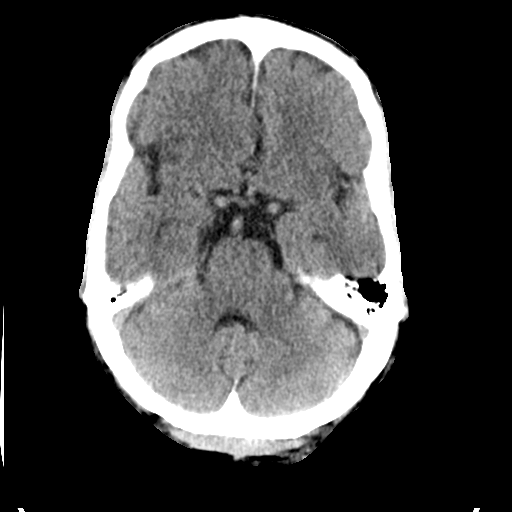
[im 9/31  brain]
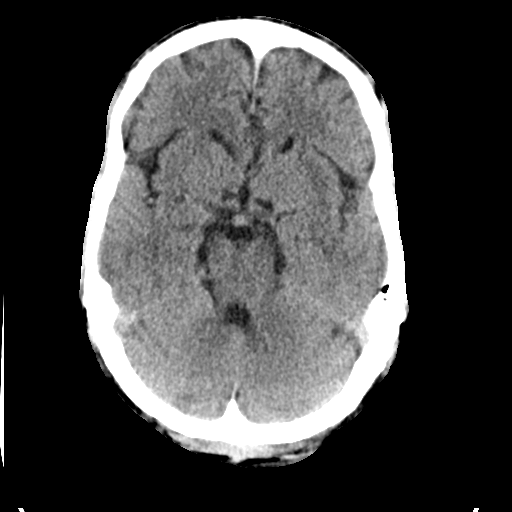
[im 9/31  bone]
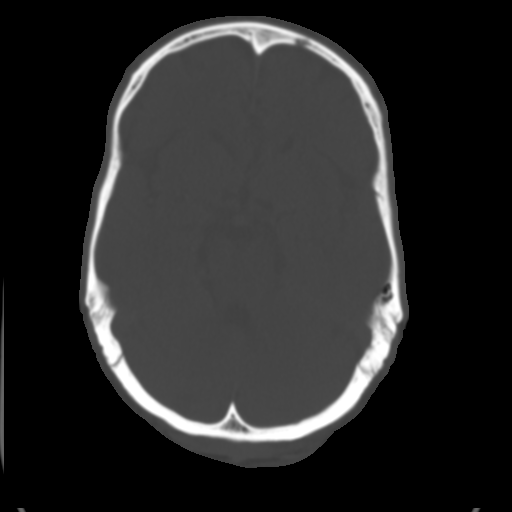
[im 11/31  brain]
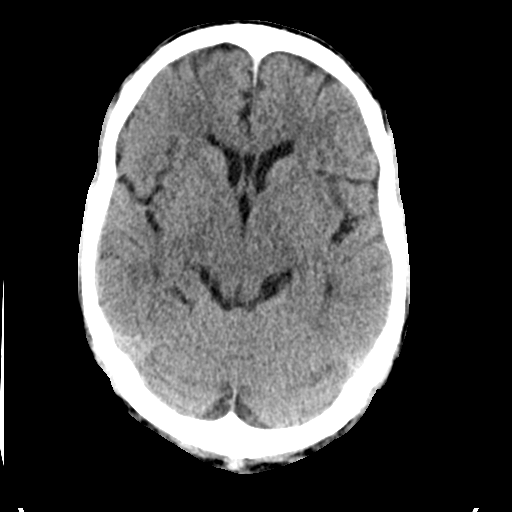
[im 13/31  brain]
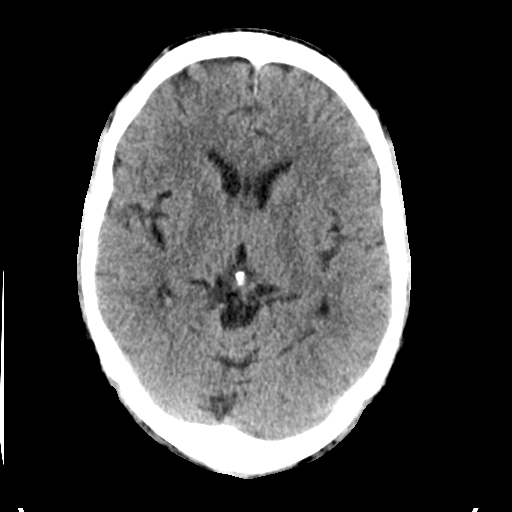
[im 15/31  brain]
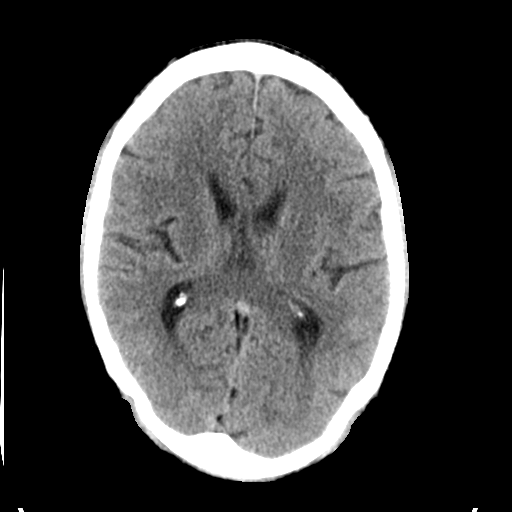
[im 16/31  brain]
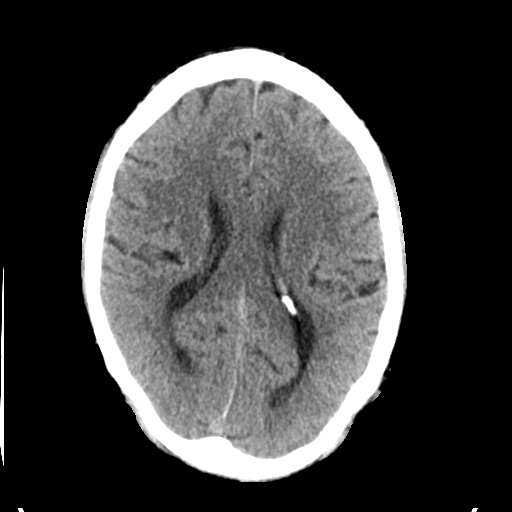
[im 16/31  bone]
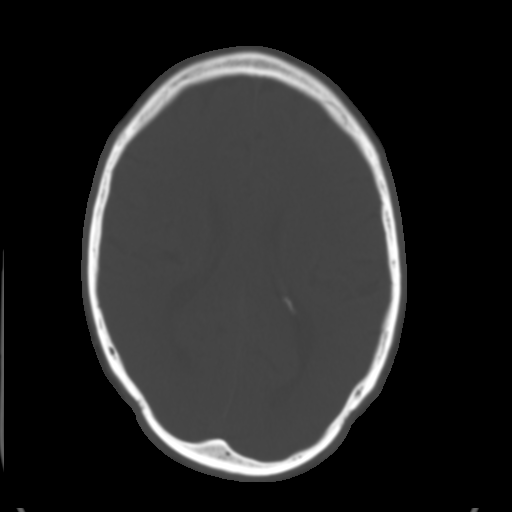
[im 18/31  brain]
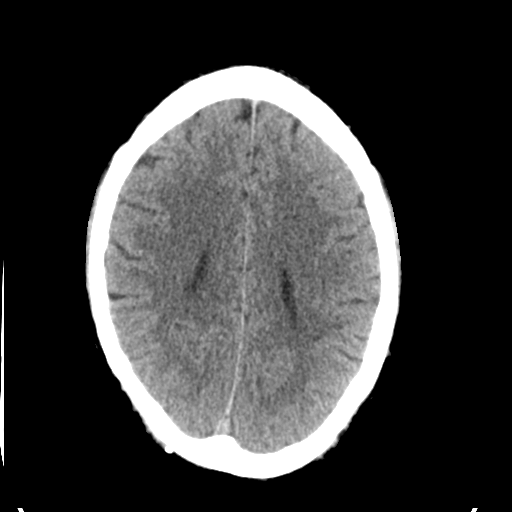
[im 20/31  brain]
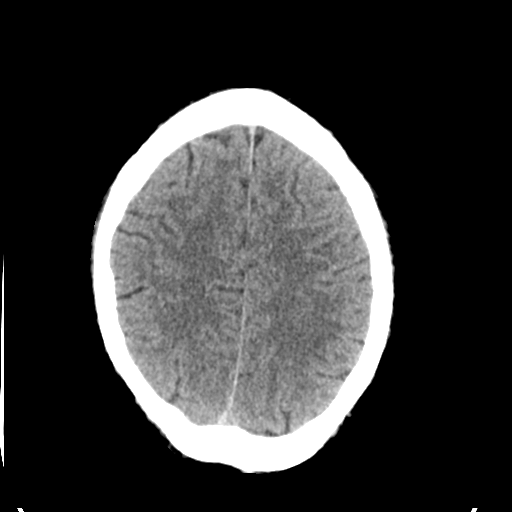
[im 22/31  brain]
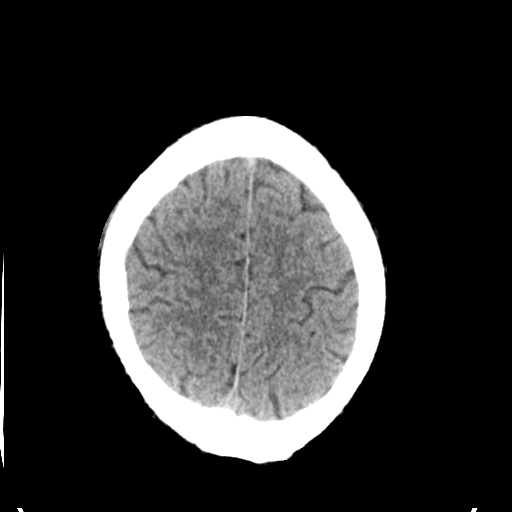
[im 23/31  brain]
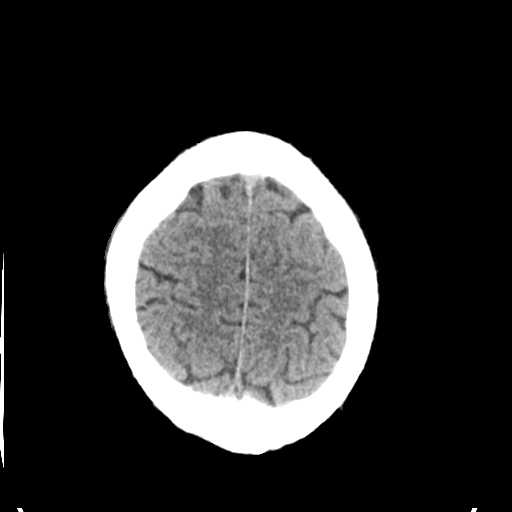
[im 23/31  bone]
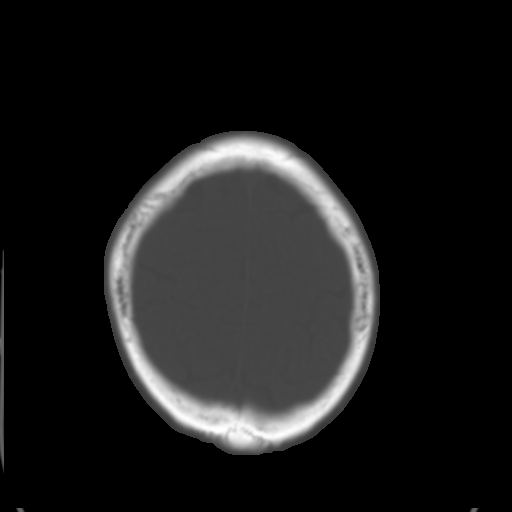
[im 25/31  brain]
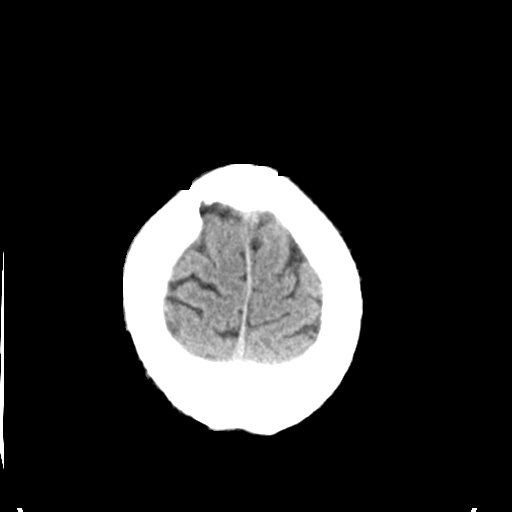
[im 27/31  brain]
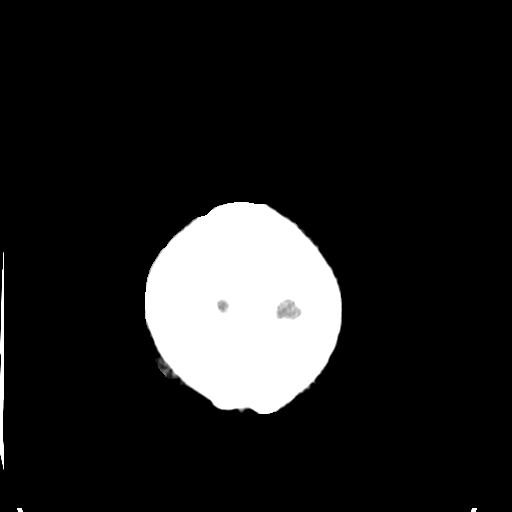
[im 29/31  brain]
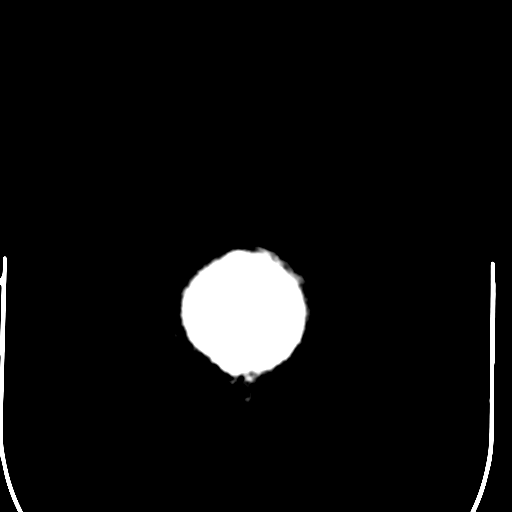

[16 of 30 positions shown; findings below may reference images not displayed]

FINDINGS: No skull fracture is noted. Paranasal sinuses and mastoid air cells
are unremarkable.

No intracranial hemorrhage, mass effect or midline shift.

No acute infarction. No mass lesion is noted on this unenhanced
scan. The gray and white-matter differentiation is preserved. No
hydrocephalus.
IMPRESSION: No acute intracranial abnormality.

## 2015-07-03 ENCOUNTER — Telehealth: Payer: Self-pay | Admitting: Internal Medicine

## 2015-07-03 NOTE — Telephone Encounter (Signed)
Follow up     Henrietta calling back to include patient cell phone #

## 2015-07-03 NOTE — Telephone Encounter (Signed)
Left a message on cell phone for patient's wife to reduce his Lisinopril to 10 mg daily and keep a log of his BP's after he has been on the decreased dose for a week.  Keep the log for a week  Then call me with the readings

## 2015-07-03 NOTE — Telephone Encounter (Signed)
Please call,pt have been taking his Lisinopril wrong.He have been taking 20 mg,he should be taking 10 mg.

## 2015-07-03 NOTE — Telephone Encounter (Signed)
Spoke with wife, patient, and the pharmacy.  We will call in the Lisinopril 10 mg and this will be considered a dose change so he will not have to pay for medication.  The pharmacist is calling him now to explain

## 2015-07-03 NOTE — Telephone Encounter (Signed)
Returning your call. °

## 2015-07-10 ENCOUNTER — Other Ambulatory Visit: Payer: Self-pay | Admitting: Internal Medicine

## 2015-08-01 ENCOUNTER — Other Ambulatory Visit: Payer: Self-pay | Admitting: Internal Medicine

## 2015-11-21 ENCOUNTER — Other Ambulatory Visit: Payer: Self-pay | Admitting: *Deleted

## 2015-11-21 MED ORDER — LISINOPRIL 10 MG PO TABS: 10.0000 mg | ORAL_TABLET | Freq: Every day | ORAL | Status: DC

## 2016-02-16 ENCOUNTER — Other Ambulatory Visit: Payer: Self-pay | Admitting: Internal Medicine

## 2016-02-16 DIAGNOSIS — I10 Essential (primary) hypertension: Secondary | ICD-10-CM

## 2016-03-08 ENCOUNTER — Other Ambulatory Visit: Payer: Self-pay | Admitting: Internal Medicine

## 2016-03-08 DIAGNOSIS — I10 Essential (primary) hypertension: Secondary | ICD-10-CM

## 2016-03-08 DIAGNOSIS — I480 Paroxysmal atrial fibrillation: Secondary | ICD-10-CM

## 2016-03-13 ENCOUNTER — Other Ambulatory Visit: Payer: Self-pay | Admitting: Family Medicine

## 2016-03-13 DIAGNOSIS — R9389 Abnormal findings on diagnostic imaging of other specified body structures: Secondary | ICD-10-CM

## 2016-03-14 ENCOUNTER — Ambulatory Visit
Admission: RE | Admit: 2016-03-14 | Discharge: 2016-03-14 | Disposition: A | Discharge status: Home / Self Care | Payer: Medicare Other | Source: Ambulatory Visit | Attending: Family Medicine | Admitting: Family Medicine

## 2016-03-14 DIAGNOSIS — R9389 Abnormal findings on diagnostic imaging of other specified body structures: Secondary | ICD-10-CM

## 2016-03-18 ENCOUNTER — Other Ambulatory Visit: Payer: Self-pay | Admitting: Internal Medicine

## 2016-03-24 ENCOUNTER — Telehealth: Payer: Self-pay | Admitting: Internal Medicine

## 2016-03-24 NOTE — Telephone Encounter (Signed)
Called, spoke with pt. Pt stated he should only have 1 upcoming appt. Pt stated Dr. Ladona Ridgel said he would see pt as both EP and General Cardiologist. Pt has appt on 04/02/16 with Dr. Ladona Ridgel and 04/07/16 with Dr. Eldridge Dace (ref from Dr. Tenny Craw). Informed I will forward to Dr. Ladona Ridgel to advise.

## 2016-03-24 NOTE — Telephone Encounter (Signed)
Called, left voice message. Returning pt's call.  

## 2016-03-24 NOTE — Telephone Encounter (Signed)
Patient states he was told by his primary care doctor he has fluid around his heart.  He has an appointment with Dr. Ladona Ridgel on 03/1516, bur wants to talk to the nurse.

## 2016-04-02 ENCOUNTER — Ambulatory Visit (INDEPENDENT_AMBULATORY_CARE_PROVIDER_SITE_OTHER): Payer: Medicare Other | Admitting: Internal Medicine

## 2016-04-02 ENCOUNTER — Encounter: Payer: Self-pay | Admitting: Internal Medicine

## 2016-04-02 VITALS — BP 142/118 | HR 65 | Ht 73.5 in | Wt 194.2 lb

## 2016-04-02 DIAGNOSIS — I480 Paroxysmal atrial fibrillation: Secondary | ICD-10-CM | POA: Diagnosis not present

## 2016-04-02 DIAGNOSIS — R9431 Abnormal electrocardiogram [ECG] [EKG]: Secondary | ICD-10-CM | POA: Diagnosis not present

## 2016-04-02 DIAGNOSIS — I10 Essential (primary) hypertension: Secondary | ICD-10-CM

## 2016-04-02 NOTE — Progress Notes (Signed)
HPI [name] returns today for followup. He is a very pleasant 65 year old man with an extensive past medical history. He underwent catheter ablation of SVT several years ago. He had recurrent episodes of shortness of breath, which was his equivalent for symptomatic SVT, and was subsequently found to have atrial fibrillation. In the interim, he has developed chest pressure with exertion which comes and goes with rest. He had a CT scan which demonstrated a small pericardial effusion and emphasematous changes. He has an abnormal ECG at baseline.   No Known Allergies   Current Outpatient Prescriptions  Medication Sig Dispense Refill  . BIDIL 20-37.5 MG tablet TAKE 1 TABLET BY MOUTH 2 (TWO) TIMES DAILY. 60 tablet 8  . carvedilol (COREG) 25 MG tablet Take 25 mg by mouth 2 (two) times daily with a meal.    . ELIQUIS 5 MG TABS tablet TAKE 1 TABLET (5 MG TOTAL) BY MOUTH 2 (TWO) TIMES DAILY. 60 tablet 0  . famotidine (PEPCID) 20 MG tablet Take 1 tablet (20 mg total) by mouth 2 (two) times daily. 60 tablet 1  . furosemide (LASIX) 40 MG tablet Take 1 tablet (40 mg total) by mouth daily. Please call and schedule an appointment with Dr Ladona Ridgel 30 tablet 0  . lisinopril (PRINIVIL,ZESTRIL) 10 MG tablet Take 1 tablet (10 mg total) by mouth daily. 30 tablet 5  . nitroGLYCERIN (NITROSTAT) 0.4 MG SL tablet Place 0.4 mg under the tongue every 5 (five) minutes x 3 doses as needed for chest pain.    . pantoprazole (PROTONIX) 40 MG tablet Take 1 tablet by mouth 2 (two) times daily.    . polyethylene glycol (MIRALAX / GLYCOLAX) packet Take 17 g by mouth daily as needed for moderate constipation.    . tamsulosin (FLOMAX) 0.4 MG CAPS capsule Take 1 capsule (0.4 mg total) by mouth daily after supper. 30 capsule    No current facility-administered medications for this visit.      Past Medical History:  Diagnosis Date  . Anemia    Microcytic   . Asthma   . CHF (congestive heart failure) (HCC)   .  Chronic upper back pain   . Coronary artery disease   . Hypercholesteremia   . Hypertension   . Old myocardial infarct    /H&P 04/09/2012 (05/06/2012)  . SVT (supraventricular tachycardia) (HCC)     ROS:   All systems reviewed and negative except as noted in the HPI.   Past Surgical History:  Procedure Laterality Date  . CARDIAC CATHETERIZATION  04/08/2012  . CARDIOVERSION N/A 09/27/2013   Procedure: CARDIOVERSION;  Surgeon: Vesta Mixer, MD;  Location: [facility] ENDOSCOPY;  Service: Cardiovascular;  Laterality: N/A;  . EXCISIONAL HEMORRHOIDECTOMY  ~ 2007  . LEFT AND RIGHT HEART CATHETERIZATION WITH CORONARY ANGIOGRAM N/A 09/28/2013   Procedure: LEFT AND RIGHT HEART CATHETERIZATION WITH CORONARY ANGIOGRAM;  Surgeon: Iran Ouch, MD;  Location: [facility] CATH LAB;  Service: Cardiovascular;  Laterality: N/A;  . LEFT HEART CATHETERIZATION WITH CORONARY ANGIOGRAM N/A 04/08/2012   Procedure: LEFT HEART CATHETERIZATION WITH CORONARY ANGIOGRAM;  Surgeon: Kathleene Hazel, MD;  Location: [facility] CATH LAB;  Service: Cardiovascular;  Laterality: N/A;  . SUPRAVENTRICULAR TACHYCARDIA ABLATION  05/06/2012  . SUPRAVENTRICULAR TACHYCARDIA ABLATION N/A 05/06/2012   Procedure: SUPRAVENTRICULAR TACHYCARDIA ABLATION;  Surgeon: Marinus Maw, MD;  Location: [facility] CATH LAB;  Service: Cardiovascular;  Laterality: N/A;  . TEE WITHOUT CARDIOVERSION N/A 09/27/2013   Procedure: TRANSESOPHAGEAL ECHOCARDIOGRAM (TEE);  Surgeon: Loistine Chance  Earnstine RegalJ Nahser, MD;  Location: [facility] ENDOSCOPY;  Service: Cardiovascular;  Laterality: N/A;     No family history on file.   Social History   Social History  . Marital status: Married    Spouse name: N/A  . Number of children: N/A  . Years of education: N/A   Occupational History  . Not on file.   Social History Main Topics  . Smoking status: Former Smoker    Years: 4.00    Types: Cigarettes  . Smokeless tobacco: Never Used     Comment: 05/06/2012 "quit in the 1990's; never smoked  much"  . Alcohol use 1.2 oz/week    2 Cans of beer per week  . Drug use:     Types: Marijuana     Comment: 05/06/2012 "when I was younger I probably  smoked pot"  . Sexual activity: Not Currently   Other Topics Concern  . Not on file   Social History Narrative  . No narrative on file     BP (!) 142/118   Pulse 65   Ht 6' 1.5" (1.867 m)   Wt 194 lb 3.2 oz (88.1 kg)   BMI 25.27 kg/m   Physical Exam:  Well appearing 65 year old man, NAD HEENT: Unremarkable Neck:  7 cm JVD, no thyromegally Back:  No CVA tenderness Lungs:  Clear with no wheezes, rales, or rhonchi. HEART:  Regular rate rhythm, no murmurs, no rubs, no clicks, soft s4 Abd:  soft, positive bowel sounds, no organomegally, no rebound, no guarding Ext:  2 plus pulses, no edema, no cyanosis, no clubbing Skin:  No rashes no nodules Neuro:  CN II through XII intact, motor grossly intact  ECG -normal sinus rhythm with an intraventricular conduction delay   Pulse 0x - we walked around the office and his oxygen sats remained in the mid 90's. His HR was in the 80's.   Assess/Plan: 1. Typical chest pain - his symptoms are concerning. I have asked the patient to undergo exercise myoview stress testing.  2. SVT - no evidence of recurrent arrhythmias 3. Pericardial effusion - his effusion is small. I have asked the a call us if he has sob or edema. 4. HTN - he did not take his beta blocker yet today. I have asked that he not miss his meds and maintain a low sodium diet.  Leonia ReevesGregg Taylor,M.D.

## 2016-04-02 NOTE — Patient Instructions (Signed)
Medication Instructions:  Your physician recommends that you continue on your current medications as directed. Please refer to the Current Medication list given to you today.   Labwork: None Ordered   Testing/Procedures: Your physician has requested that you have a stress myoview. For further information please visit [email or web address] Please follow instruction sheet, as given.   Follow-Up: Follow-up to be determined after results of myoview.  Any Other Special Instructions Will Be Listed Below (If Applicable).     If you need a refill on your cardiac medications before your next appointment, please call your pharmacy.

## 2016-04-07 ENCOUNTER — Ambulatory Visit: Payer: Medicare Other | Admitting: Interventional Cardiology

## 2016-04-14 ENCOUNTER — Telehealth (HOSPITAL_COMMUNITY): Payer: Self-pay | Admitting: *Deleted

## 2016-04-14 NOTE — Telephone Encounter (Signed)
Patient given detailed instructions per Myocardial Perfusion Study Information Sheet for the test on 04/16/16 at 0745. Patient notified to arrive 15 minutes early and that it is imperative to arrive on time for appointment to keep from having the test rescheduled.  If you need to cancel or reschedule your appointment, please call the office within 24 hours of your appointment. Failure to do so may result in a cancellation of your appointment, and a $50 no show fee. Patient verbalized understanding.Cody Brewer W    

## 2016-04-15 ENCOUNTER — Telehealth: Payer: Self-pay | Admitting: Internal Medicine

## 2016-04-15 NOTE — Telephone Encounter (Signed)
Pt calling for instructions for stress test for tomorrow am at 745-pls call  [phone]

## 2016-04-15 NOTE — Telephone Encounter (Signed)
Called patient.  Reviewed to hold carvedilol tonight and tomorrow am. He will also hold lasix tomorrow.  Advised ok to take all other meds.  Provided instructions re: eating/drinking/caffeine/decaf/chocolate  Pt verbalizes understanding and agreement and thanked me for calling him back.

## 2016-04-16 ENCOUNTER — Ambulatory Visit (HOSPITAL_COMMUNITY): Payer: Medicare Other | Attending: Cardiology

## 2016-04-16 DIAGNOSIS — R9431 Abnormal electrocardiogram [ECG] [EKG]: Secondary | ICD-10-CM | POA: Insufficient documentation

## 2016-04-16 LAB — MYOCARDIAL PERFUSION IMAGING
CHL CUP NUCLEAR SSS: 10
CHL CUP RESTING HR STRESS: 64 {beats}/min
LHR: 0.24
LV sys vol: 174 mL
LVDIAVOL: 228 mL (ref 62–150)
Peak HR: 88 {beats}/min
SDS: 1
SRS: 9
TID: 0.86

## 2016-04-16 MED ORDER — TECHNETIUM TC 99M TETROFOSMIN IV KIT
32.1000 | PACK | Freq: Once | INTRAVENOUS | Status: AC | PRN
  Administered 2016-04-16: 32.1 via INTRAVENOUS
  Filled 2016-04-16: qty 33

## 2016-04-16 MED ORDER — REGADENOSON 0.4 MG/5ML IV SOLN
0.4000 mg | Freq: Once | INTRAVENOUS | Status: AC
  Administered 2016-04-16: 0.4 mg via INTRAVENOUS

## 2016-04-16 MED ORDER — TECHNETIUM TC 99M TETROFOSMIN IV KIT
10.2000 | PACK | Freq: Once | INTRAVENOUS | Status: AC | PRN
  Administered 2016-04-16: 10.2 via INTRAVENOUS
  Filled 2016-04-16: qty 11

## 2016-04-17 ENCOUNTER — Telehealth: Payer: Self-pay | Admitting: Cardiovascular Disease

## 2016-04-17 ENCOUNTER — Other Ambulatory Visit: Payer: Self-pay

## 2016-04-17 DIAGNOSIS — R0789 Other chest pain: Secondary | ICD-10-CM

## 2016-04-17 DIAGNOSIS — I519 Heart disease, unspecified: Secondary | ICD-10-CM

## 2016-04-17 DIAGNOSIS — R931 Abnormal findings on diagnostic imaging of heart and coronary circulation: Secondary | ICD-10-CM

## 2016-04-17 NOTE — Telephone Encounter (Signed)
Pt returning call for results of stress test-is at home now, can reach him there

## 2016-04-18 NOTE — Telephone Encounter (Signed)
Follow Up:    Pt calling again today,still waiting for his stress test results.

## 2016-04-18 NOTE — Telephone Encounter (Signed)
Returned call to patient and let him know the results of his stress test and that an Echo has been ordered for 05/06/16 at 8am to assess his low EF.  He says that he will need to check with his wife about the date and time as he thinks he has another appointment. He will call back on Monday to reschedule echo if needed.

## 2016-04-21 ENCOUNTER — Other Ambulatory Visit: Payer: Self-pay | Admitting: Internal Medicine

## 2016-05-06 ENCOUNTER — Ambulatory Visit (HOSPITAL_COMMUNITY): Payer: Medicare Other | Attending: Cardiovascular Disease

## 2016-05-06 ENCOUNTER — Other Ambulatory Visit: Payer: Self-pay

## 2016-05-06 DIAGNOSIS — I519 Heart disease, unspecified: Secondary | ICD-10-CM | POA: Diagnosis not present

## 2016-05-06 DIAGNOSIS — I4891 Unspecified atrial fibrillation: Secondary | ICD-10-CM | POA: Diagnosis not present

## 2016-05-06 DIAGNOSIS — I110 Hypertensive heart disease with heart failure: Secondary | ICD-10-CM | POA: Insufficient documentation

## 2016-05-06 DIAGNOSIS — R0789 Other chest pain: Secondary | ICD-10-CM | POA: Diagnosis not present

## 2016-05-06 DIAGNOSIS — R931 Abnormal findings on diagnostic imaging of heart and coronary circulation: Secondary | ICD-10-CM

## 2016-05-06 DIAGNOSIS — I2510 Atherosclerotic heart disease of native coronary artery without angina pectoris: Secondary | ICD-10-CM | POA: Diagnosis not present

## 2016-05-06 DIAGNOSIS — I252 Old myocardial infarction: Secondary | ICD-10-CM | POA: Diagnosis not present

## 2016-05-06 DIAGNOSIS — E785 Hyperlipidemia, unspecified: Secondary | ICD-10-CM | POA: Diagnosis not present

## 2016-05-06 DIAGNOSIS — Z87891 Personal history of nicotine dependence: Secondary | ICD-10-CM | POA: Insufficient documentation

## 2016-05-06 DIAGNOSIS — I509 Heart failure, unspecified: Secondary | ICD-10-CM | POA: Diagnosis not present

## 2016-05-18 ENCOUNTER — Other Ambulatory Visit: Payer: Self-pay | Admitting: Internal Medicine

## 2016-05-27 ENCOUNTER — Encounter: Payer: Self-pay | Admitting: Internal Medicine

## 2016-05-27 ENCOUNTER — Ambulatory Visit (INDEPENDENT_AMBULATORY_CARE_PROVIDER_SITE_OTHER): Payer: Medicare Other | Admitting: Internal Medicine

## 2016-05-27 VITALS — BP 140/100 | HR 76 | Ht 74.0 in | Wt 195.2 lb

## 2016-05-27 DIAGNOSIS — I480 Paroxysmal atrial fibrillation: Secondary | ICD-10-CM

## 2016-05-27 DIAGNOSIS — I10 Essential (primary) hypertension: Secondary | ICD-10-CM

## 2016-05-27 NOTE — Patient Instructions (Addendum)
Medication Instructions:  Your physician recommends that you continue on your current medications as directed. Please refer to the Current Medication list given to you today.   Labwork: None Ordered   Testing/Procedures: None Ordered   Follow-Up: Your physician wants you to follow-up in: 6 months with Dr. Taylor. You will receive a reminder letter in the mail two months in advance. If you don't receive a letter, please call our office to schedule the follow-up appointment.    Any Other Special Instructions Will Be Listed Below (If Applicable).     If you need a refill on your cardiac medications before your next appointment, please call your pharmacy.   

## 2016-05-27 NOTE — Progress Notes (Signed)
HPI Cody Brewer returns today for followup. He is a very pleasant 66 year old man with an extensive past medical history. He underwent catheter ablation of SVT several years ago. He had recurrent episodes of shortness of breath, which was his equivalent for symptomatic SVT, and was subsequently found to have atrial fibrillation. In the interim, he has developed chest pressure with exertion which comes and goes with rest. He had a CT scan which demonstrated a small pericardial effusion and emphasematous changes. He has an abnormal ECG at baseline. He underwent stress testing and was found to have severe LV dysfunction by gated spect but his echo was read as preserved LV function. I have reviewed the study. I think the EF is probably 40% with focal wall motion abnormalities. He has had 2 cardiac caths demonstrating no obstructive disease, the last 2.5 years ago. The patient gets sob and has chest heaviness which gets better with rest.    No Known Allergies   Current Outpatient Prescriptions  Medication Sig Dispense Refill  . BIDIL 20-37.5 MG tablet TAKE 1 TABLET BY MOUTH 2 (TWO) TIMES DAILY. 60 tablet 8  . carvedilol (COREG) 25 MG tablet Take 25 mg by mouth 2 (two) times daily with a meal.    . ELIQUIS 5 MG TABS tablet TAKE 1 TABLET (5 MG TOTAL) BY MOUTH 2 (TWO) TIMES DAILY. 60 tablet 0  . famotidine (PEPCID) 20 MG tablet Take 1 tablet (20 mg total) by mouth 2 (two) times daily. 60 tablet 1  . furosemide (LASIX) 40 MG tablet Take 1 tablet (40 mg total) by mouth daily. 30 tablet 11  . lisinopril (PRINIVIL,ZESTRIL) 10 MG tablet Take 1 tablet (10 mg total) by mouth daily. 30 tablet 5  . nitroGLYCERIN (NITROSTAT) 0.4 MG SL tablet Place 0.4 mg under the tongue every 5 (five) minutes x 3 doses as needed for chest pain.    . pantoprazole (PROTONIX) 40 MG tablet Take 1 tablet by mouth 2 (two) times daily.    . polyethylene glycol (MIRALAX / GLYCOLAX) packet Take 17 g by mouth daily as needed for  moderate constipation.    . tamsulosin (FLOMAX) 0.4 MG CAPS capsule Take 1 capsule (0.4 mg total) by mouth daily after supper. 30 capsule    No current facility-administered medications for this visit.      Past Medical History:  Diagnosis Date  . Anemia    Microcytic   . Asthma   . CHF (congestive heart failure) (HCC)   . Chronic upper back pain   . Coronary artery disease   . Hypercholesteremia   . Hypertension   . Old myocardial infarct    /H&P 04/09/2012 (05/06/2012)  . SVT (supraventricular tachycardia) (HCC)     ROS:   All systems reviewed and negative except as noted in the HPI.   Past Surgical History:  Procedure Laterality Date  . CARDIAC CATHETERIZATION  04/08/2012  . CARDIOVERSION N/A 09/27/2013   Procedure: CARDIOVERSION;  Surgeon: Vesta Mixer, MD;  Location: [facility] ENDOSCOPY;  Service: Cardiovascular;  Laterality: N/A;  . EXCISIONAL HEMORRHOIDECTOMY  ~ 2007  . LEFT AND RIGHT HEART CATHETERIZATION WITH CORONARY ANGIOGRAM N/A 09/28/2013   Procedure: LEFT AND RIGHT HEART CATHETERIZATION WITH CORONARY ANGIOGRAM;  Surgeon: Iran Ouch, MD;  Location: [facility] CATH LAB;  Service: Cardiovascular;  Laterality: N/A;  . LEFT HEART CATHETERIZATION WITH CORONARY ANGIOGRAM N/A 04/08/2012   Procedure: LEFT HEART CATHETERIZATION WITH CORONARY ANGIOGRAM;  Surgeon: Kathleene Hazel, MD;  Location: [facility]  CATH LAB;  Service: Cardiovascular;  Laterality: N/A;  . SUPRAVENTRICULAR TACHYCARDIA ABLATION  05/06/2012  . SUPRAVENTRICULAR TACHYCARDIA ABLATION N/A 05/06/2012   Procedure: SUPRAVENTRICULAR TACHYCARDIA ABLATION;  Surgeon: Marinus Maw, MD;  Location: [facility] CATH LAB;  Service: Cardiovascular;  Laterality: N/A;  . TEE WITHOUT CARDIOVERSION N/A 09/27/2013   Procedure: TRANSESOPHAGEAL ECHOCARDIOGRAM (TEE);  Surgeon: Vesta Mixer, MD;  Location: [facility] ENDOSCOPY;  Service: Cardiovascular;  Laterality: N/A;     Family History  Problem Relation Age of Onset  . Diabetes Mother   .  Stroke Father   . Cancer Sister   . Cancer Brother   . Cancer Brother   . Cancer Brother   . Cancer Sister   . Cancer Brother   . Heart disease Brother      Social History   Social History  . Marital status: Married    Spouse name: N/A  . Number of children: N/A  . Years of education: N/A   Occupational History  . Not on file.   Social History Main Topics  . Smoking status: Former Smoker    Years: 4.00    Types: Cigarettes  . Smokeless tobacco: Never Used     Comment: 05/06/2012 "quit in the 1990's; never smoked much"  . Alcohol use 1.2 oz/week    2 Cans of beer per week  . Drug use:     Types: Marijuana     Comment: 05/06/2012 "when I was younger I probably  smoked pot"  . Sexual activity: Not Currently   Other Topics Concern  . Not on file   Social History Narrative  . No narrative on file     BP (!) 140/100   Pulse 76   Ht 6\' 2"  (1.88 m)   Wt 195 lb 3.2 oz (88.5 kg)   BMI 25.06 kg/m   Physical Exam:  Well appearing 66 year old man, NAD HEENT: Unremarkable Neck:  7 cm JVD, no thyromegally Back:  No CVA tenderness Lungs:  Clear with no wheezes, rales, or rhonchi. HEART:  Regular rate rhythm, no murmurs, no rubs, no clicks, soft s4 Abd:  soft, positive bowel sounds, no organomegally, no rebound, no guarding Ext:  2 plus pulses, no edema, no cyanosis, no clubbing Skin:  No rashes no nodules Neuro:  CN II through XII intact, motor grossly intact  Assess/Plan: 1. Typical chest pain - his symptoms are noted but he has had them for years and no ischemia on lexiscan myoview. He will undergo watchful waiting. 2. SVT - no evidence of recurrent arrhythmias 3. Chronic systolic heart failure - review of his old data reveals prior severe LV dysfunction. I think it is better now. We discussed the importance of physical activity, a low sodium diet, and taking his meds.  4. HTN - he did not take his beta blocker yet today. I have asked that he not miss his meds and  maintain a low sodium diet.  Cody Brewer.D.

## 2016-07-16 ENCOUNTER — Other Ambulatory Visit: Payer: Self-pay | Admitting: Internal Medicine

## 2016-07-21 ENCOUNTER — Other Ambulatory Visit: Payer: Self-pay | Admitting: Internal Medicine

## 2016-07-21 ENCOUNTER — Telehealth: Payer: Self-pay | Admitting: Internal Medicine

## 2016-07-21 NOTE — Telephone Encounter (Signed)
New Message:    Please call,question about his Bidil. Pt says he can not afford this medication.

## 2016-07-21 NOTE — Telephone Encounter (Signed)
Follow Up    Pt needs call back , he can not afford the Bidil and needs you to change him today to something else

## 2016-07-21 NOTE — Telephone Encounter (Signed)
°   New message  ° ° pt verbalized that he is calling to speak to rn   °

## 2016-07-22 NOTE — Telephone Encounter (Signed)
Follow Up:    Wife wants to talk to you about pt's Bidil.

## 2016-07-22 NOTE — Telephone Encounter (Signed)
Called, spoke with pt and his wife. Pt stated med Bidil and Eliquis are expensive. Pt asked if a different medication would be cheaper. Informed to apply for Rx assistance at [facility] [phone]. Pt will call to apply to PAN and stay on Bidil and Eliquis, both are tier 3. Informed pt to call back if he has further questions or concerns abot medications, or decides he would like to look into switching medications. Pt verbalized understanding.

## 2016-07-22 NOTE — Telephone Encounter (Signed)
Follow up      Pt c/o medication issue:  1. Name of Medication: bidil 2. How are you currently taking this medication (dosage and times per day)?  20-37.5 bid 3. Are you having a reaction (difficulty breathing--STAT)? no 4. What is your medication issue?  Pt cannot afford this medication.  What else can he take.  Please call

## 2016-07-24 ENCOUNTER — Telehealth: Payer: Self-pay

## 2016-07-24 NOTE — Telephone Encounter (Signed)
Tier exception request for Eliquis 5mg   Sent to [facility].

## 2016-07-24 NOTE — Telephone Encounter (Signed)
A tier exception for Eliquis has been requested. Can Bidil be changed to the generic equivalents so it would be more affordable for patient? Both of these are a tier 3.

## 2016-07-25 NOTE — Telephone Encounter (Signed)
That would be fine. GT 

## 2016-07-29 ENCOUNTER — Other Ambulatory Visit: Payer: Self-pay

## 2016-07-29 MED ORDER — ISOSORBIDE DINITRATE 20 MG PO TABS: 20.0000 mg | ORAL_TABLET | Freq: Two times a day (BID) | ORAL | 6 refills | Status: DC

## 2016-07-29 MED ORDER — HYDRALAZINE HCL 25 MG PO TABS: 37.5000 mg | ORAL_TABLET | Freq: Two times a day (BID) | ORAL | 6 refills | Status: DC

## 2016-07-29 NOTE — Telephone Encounter (Signed)
Spoke with patient and wife today.Cody Brewer He has already picked up Bidil and paid $196 for it. I have let them know that I sent in new Rx for Isordil 20mg  bid, and Hydralazine 37.5 mg bid. He will finish up the Bidil this month, then switch to the 2 generics. Eliquis was denied for a tier exception.

## 2016-08-21 ENCOUNTER — Telehealth: Payer: Self-pay | Admitting: *Deleted

## 2016-08-21 NOTE — Telephone Encounter (Signed)
Received fax from [facility] regarding [employer]. They denied changing tier coverage from Tier 3 to Tier 2 due to Eliquis is brand name and they only lower tiers/copayments for generic medications.

## 2016-09-21 ENCOUNTER — Encounter (HOSPITAL_COMMUNITY): Payer: Self-pay | Admitting: Emergency Medicine

## 2016-09-21 ENCOUNTER — Inpatient Hospital Stay (HOSPITAL_COMMUNITY)
Admission: EM | Admit: 2016-09-21 | Discharge: 2016-09-25 | DRG: 280 | Disposition: A | Discharge status: Home / Self Care | Payer: Medicare Other | Attending: Cardiology | Admitting: Cardiology

## 2016-09-21 ENCOUNTER — Emergency Department (HOSPITAL_COMMUNITY): Payer: Medicare Other

## 2016-09-21 DIAGNOSIS — I351 Nonrheumatic aortic (valve) insufficiency: Secondary | ICD-10-CM | POA: Diagnosis not present

## 2016-09-21 DIAGNOSIS — I25119 Atherosclerotic heart disease of native coronary artery with unspecified angina pectoris: Secondary | ICD-10-CM | POA: Diagnosis present

## 2016-09-21 DIAGNOSIS — Z79899 Other long term (current) drug therapy: Secondary | ICD-10-CM

## 2016-09-21 DIAGNOSIS — I429 Cardiomyopathy, unspecified: Secondary | ICD-10-CM | POA: Diagnosis present

## 2016-09-21 DIAGNOSIS — I428 Other cardiomyopathies: Secondary | ICD-10-CM | POA: Diagnosis present

## 2016-09-21 DIAGNOSIS — Z7901 Long term (current) use of anticoagulants: Secondary | ICD-10-CM

## 2016-09-21 DIAGNOSIS — I5033 Acute on chronic diastolic (congestive) heart failure: Secondary | ICD-10-CM | POA: Diagnosis not present

## 2016-09-21 DIAGNOSIS — I447 Left bundle-branch block, unspecified: Secondary | ICD-10-CM | POA: Diagnosis present

## 2016-09-21 DIAGNOSIS — R072 Precordial pain: Secondary | ICD-10-CM

## 2016-09-21 DIAGNOSIS — I5042 Chronic combined systolic (congestive) and diastolic (congestive) heart failure: Secondary | ICD-10-CM | POA: Diagnosis not present

## 2016-09-21 DIAGNOSIS — Z87891 Personal history of nicotine dependence: Secondary | ICD-10-CM | POA: Diagnosis not present

## 2016-09-21 DIAGNOSIS — I5043 Acute on chronic combined systolic (congestive) and diastolic (congestive) heart failure: Secondary | ICD-10-CM | POA: Diagnosis present

## 2016-09-21 DIAGNOSIS — R079 Chest pain, unspecified: Secondary | ICD-10-CM | POA: Diagnosis present

## 2016-09-21 DIAGNOSIS — I4891 Unspecified atrial fibrillation: Secondary | ICD-10-CM | POA: Diagnosis present

## 2016-09-21 DIAGNOSIS — I471 Supraventricular tachycardia: Secondary | ICD-10-CM | POA: Diagnosis present

## 2016-09-21 DIAGNOSIS — I480 Paroxysmal atrial fibrillation: Secondary | ICD-10-CM

## 2016-09-21 DIAGNOSIS — I071 Rheumatic tricuspid insufficiency: Secondary | ICD-10-CM | POA: Diagnosis present

## 2016-09-21 DIAGNOSIS — I5021 Acute systolic (congestive) heart failure: Secondary | ICD-10-CM | POA: Diagnosis not present

## 2016-09-21 DIAGNOSIS — I214 Non-ST elevation (NSTEMI) myocardial infarction: Principal | ICD-10-CM

## 2016-09-21 DIAGNOSIS — I110 Hypertensive heart disease with heart failure: Secondary | ICD-10-CM | POA: Diagnosis present

## 2016-09-21 DIAGNOSIS — I161 Hypertensive emergency: Secondary | ICD-10-CM | POA: Diagnosis present

## 2016-09-21 DIAGNOSIS — I248 Other forms of acute ischemic heart disease: Secondary | ICD-10-CM | POA: Diagnosis not present

## 2016-09-21 DIAGNOSIS — I208 Other forms of angina pectoris: Secondary | ICD-10-CM | POA: Diagnosis not present

## 2016-09-21 DIAGNOSIS — Z9889 Other specified postprocedural states: Secondary | ICD-10-CM

## 2016-09-21 DIAGNOSIS — I2510 Atherosclerotic heart disease of native coronary artery without angina pectoris: Secondary | ICD-10-CM | POA: Diagnosis present

## 2016-09-21 DIAGNOSIS — E7800 Pure hypercholesterolemia, unspecified: Secondary | ICD-10-CM | POA: Diagnosis present

## 2016-09-21 DIAGNOSIS — I4892 Unspecified atrial flutter: Secondary | ICD-10-CM | POA: Diagnosis present

## 2016-09-21 DIAGNOSIS — I2489 Other forms of acute ischemic heart disease: Secondary | ICD-10-CM | POA: Diagnosis present

## 2016-09-21 DIAGNOSIS — K219 Gastro-esophageal reflux disease without esophagitis: Secondary | ICD-10-CM | POA: Diagnosis present

## 2016-09-21 HISTORY — DX: Anxiety disorder, unspecified: F41.9

## 2016-09-21 HISTORY — DX: Depression, unspecified: F32.A

## 2016-09-21 HISTORY — DX: Major depressive disorder, single episode, unspecified: F32.9

## 2016-09-21 LAB — BASIC METABOLIC PANEL
Anion gap: 14 (ref 5–15)
BUN: 16 mg/dL (ref 6–20)
CALCIUM: 9.2 mg/dL (ref 8.9–10.3)
CO2: 20 mmol/L — AB (ref 22–32)
CREATININE: 1.04 mg/dL (ref 0.61–1.24)
Chloride: 104 mmol/L (ref 101–111)
GFR calc Af Amer: >60 mL/min (ref >60)
GFR calc non Af Amer: >60 mL/min (ref >60)
GLUCOSE: 150 mg/dL — AB (ref 65–99)
Potassium: 4.8 mmol/L (ref 3.5–5.1)
Sodium: 138 mmol/L (ref 135–145)

## 2016-09-21 LAB — CBC WITH DIFFERENTIAL/PLATELET
BASOS PCT: 0 %
Basophils Absolute: 0 10*3/uL (ref 0.0–0.1)
EOS ABS: 0.1 10*3/uL (ref 0.0–0.7)
Eosinophils Relative: 1 %
HEMATOCRIT: 46.7 % (ref 39.0–52.0)
Hemoglobin: 15.5 g/dL (ref 13.0–17.0)
Lymphocytes Relative: 24 %
Lymphs Abs: 3 10*3/uL (ref 0.7–4.0)
MCH: 25.9 pg — ABNORMAL LOW (ref 26.0–34.0)
MCHC: 33.2 g/dL (ref 30.0–36.0)
MCV: 78 fL (ref 78.0–100.0)
MONO ABS: 0.8 10*3/uL (ref 0.1–1.0)
MONOS PCT: 6 %
NEUTROS ABS: 8.6 10*3/uL — AB (ref 1.7–7.7)
Neutrophils Relative %: 69 %
Platelets: 269 10*3/uL (ref 150–400)
RBC: 5.99 MIL/uL — ABNORMAL HIGH (ref 4.22–5.81)
RDW: 14.7 % (ref 11.5–15.5)
WBC: 12.6 10*3/uL — ABNORMAL HIGH (ref 4.0–10.5)

## 2016-09-21 LAB — LIPID PANEL
CHOLESTEROL: 186 mg/dL (ref 0–200)
CHOLESTEROL: 184 mg/dL (ref 0–200)
HDL: 59 mg/dL (ref >40)
HDL: 57 mg/dL (ref >40)
LDL Cholesterol: 106 mg/dL — ABNORMAL HIGH (ref 0–99)
LDL Cholesterol: 111 mg/dL — ABNORMAL HIGH (ref 0–99)
TRIGLYCERIDES: 80 mg/dL (ref <150)
Total CHOL/HDL Ratio: 3.2 RATIO
Total CHOL/HDL Ratio: 3.2 RATIO
Triglycerides: 103 mg/dL (ref <150)
VLDL: 21 mg/dL (ref 0–40)
VLDL: 16 mg/dL (ref 0–40)

## 2016-09-21 LAB — CBC
HCT: 44.1 % (ref 39.0–52.0)
HEMOGLOBIN: 14.7 g/dL (ref 13.0–17.0)
MCH: 25.9 pg — ABNORMAL LOW (ref 26.0–34.0)
MCHC: 33.3 g/dL (ref 30.0–36.0)
MCV: 77.8 fL — AB (ref 78.0–100.0)
PLATELETS: 225 10*3/uL (ref 150–400)
RBC: 5.67 MIL/uL (ref 4.22–5.81)
RDW: 14.6 % (ref 11.5–15.5)
WBC: 10.7 10*3/uL — AB (ref 4.0–10.5)

## 2016-09-21 LAB — COMPREHENSIVE METABOLIC PANEL
ALT: 32 U/L (ref 17–63)
ANION GAP: 11 (ref 5–15)
AST: 34 U/L (ref 15–41)
Albumin: 3.6 g/dL (ref 3.5–5.0)
Alkaline Phosphatase: 69 U/L (ref 38–126)
BUN: 16 mg/dL (ref 6–20)
CALCIUM: 8.9 mg/dL (ref 8.9–10.3)
CHLORIDE: 104 mmol/L (ref 101–111)
CO2: 23 mmol/L (ref 22–32)
CREATININE: 0.97 mg/dL (ref 0.61–1.24)
Glucose, Bld: 143 mg/dL — ABNORMAL HIGH (ref 65–99)
Potassium: 3.7 mmol/L (ref 3.5–5.1)
Sodium: 138 mmol/L (ref 135–145)
Total Bilirubin: 1.3 mg/dL — ABNORMAL HIGH (ref 0.3–1.2)
Total Protein: 6.9 g/dL (ref 6.5–8.1)

## 2016-09-21 LAB — GLUCOSE, CAPILLARY: Glucose-Capillary: 110 mg/dL — ABNORMAL HIGH (ref 65–99)

## 2016-09-21 LAB — TROPONIN I
TROPONIN I: 1.12 ng/mL — AB (ref <0.03)
TROPONIN I: 1.69 ng/mL — AB (ref <0.03)
Troponin I: 0.05 ng/mL (ref <0.03)
Troponin I: 0.35 ng/mL (ref <0.03)

## 2016-09-21 LAB — BRAIN NATRIURETIC PEPTIDE: B NATRIURETIC PEPTIDE 5: 1285.4 pg/mL — AB (ref 0.0–100.0)

## 2016-09-21 LAB — MRSA PCR SCREENING: MRSA by PCR: NEGATIVE

## 2016-09-21 LAB — I-STAT TROPONIN, ED: Troponin i, poc: 0.09 ng/mL (ref 0.00–0.08)

## 2016-09-21 LAB — PROTIME-INR
INR: 1.17
INR: 1.26
PROTHROMBIN TIME: 14.9 s (ref 11.4–15.2)
PROTHROMBIN TIME: 15.9 s — AB (ref 11.4–15.2)

## 2016-09-21 LAB — APTT: aPTT: 37 seconds — ABNORMAL HIGH (ref 24–36)

## 2016-09-21 LAB — TSH: TSH: 1.715 u[IU]/mL (ref 0.350–4.500)

## 2016-09-21 MED ORDER — SUCRALFATE 1 G PO TABS
1.0000 g | ORAL_TABLET | Freq: Four times a day (QID) | ORAL | Status: DC
  Administered 2016-09-21 – 2016-09-25 (×16): 1 g via ORAL
  Filled 2016-09-21 (×16): qty 1

## 2016-09-21 MED ORDER — POLYETHYLENE GLYCOL 3350 17 G PO PACK
17.0000 g | PACK | Freq: Every day | ORAL | Status: DC | PRN
Start: 2016-09-21 — End: 2016-09-25
  Administered 2016-09-21 – 2016-09-23 (×3): 17 g via ORAL
  Filled 2016-09-21 (×3): qty 1

## 2016-09-21 MED ORDER — DILTIAZEM HCL-DEXTROSE 100-5 MG/100ML-% IV SOLN (PREMIX)
5.0000 mg/h | INTRAVENOUS | Status: DC
  Administered 2016-09-21 (×2): 5 mg/h via INTRAVENOUS
  Filled 2016-09-21 (×3): qty 100

## 2016-09-21 MED ORDER — ASPIRIN EC 81 MG PO TBEC
81.0000 mg | DELAYED_RELEASE_TABLET | Freq: Every day | ORAL | Status: DC
  Administered 2016-09-21 – 2016-09-22 (×2): 81 mg via ORAL
  Filled 2016-09-21 (×2): qty 1

## 2016-09-21 MED ORDER — ISOSORB DINITRATE-HYDRALAZINE 20-37.5 MG PO TABS: 1.0000 | ORAL_TABLET | Freq: Two times a day (BID) | ORAL | Status: DC

## 2016-09-21 MED ORDER — FUROSEMIDE 40 MG PO TABS
40.0000 mg | ORAL_TABLET | Freq: Every day | ORAL | Status: DC
  Administered 2016-09-21 – 2016-09-22 (×2): 40 mg via ORAL
  Filled 2016-09-21 (×2): qty 1

## 2016-09-21 MED ORDER — AMIODARONE IV BOLUS ONLY 150 MG/100ML
150.0000 mg | Freq: Once | INTRAVENOUS | Status: AC
  Administered 2016-09-21: 150 mg via INTRAVENOUS
  Filled 2016-09-21: qty 100

## 2016-09-21 MED ORDER — NITROGLYCERIN 0.4 MG SL SUBL: 0.4000 mg | SUBLINGUAL_TABLET | SUBLINGUAL | Status: DC | PRN

## 2016-09-21 MED ORDER — CARVEDILOL 25 MG PO TABS
25.0000 mg | ORAL_TABLET | Freq: Two times a day (BID) | ORAL | Status: DC
Start: 2016-09-21 — End: 2016-09-25
  Administered 2016-09-21 – 2016-09-25 (×9): 25 mg via ORAL
  Filled 2016-09-21 (×9): qty 1

## 2016-09-21 MED ORDER — FAMOTIDINE 20 MG PO TABS
20.0000 mg | ORAL_TABLET | Freq: Two times a day (BID) | ORAL | Status: DC
  Administered 2016-09-21 – 2016-09-25 (×9): 20 mg via ORAL
  Filled 2016-09-21 (×9): qty 1

## 2016-09-21 MED ORDER — NITROGLYCERIN IN D5W 200-5 MCG/ML-% IV SOLN
0.0000 ug/min | INTRAVENOUS | Status: DC
  Administered 2016-09-21: 5 ug/min via INTRAVENOUS
  Filled 2016-09-21: qty 250

## 2016-09-21 MED ORDER — SODIUM CHLORIDE 0.9% FLUSH
3.0000 mL | Freq: Two times a day (BID) | INTRAVENOUS | Status: DC
  Administered 2016-09-21 – 2016-09-24 (×5): 3 mL via INTRAVENOUS

## 2016-09-21 MED ORDER — LISINOPRIL 10 MG PO TABS
10.0000 mg | ORAL_TABLET | Freq: Every day | ORAL | Status: DC
  Administered 2016-09-21 – 2016-09-22 (×2): 10 mg via ORAL
  Filled 2016-09-21 (×2): qty 1

## 2016-09-21 MED ORDER — APIXABAN 5 MG PO TABS
5.0000 mg | ORAL_TABLET | Freq: Two times a day (BID) | ORAL | Status: DC
  Administered 2016-09-21 (×2): 5 mg via ORAL
  Filled 2016-09-21 (×2): qty 1

## 2016-09-21 MED ORDER — TAMSULOSIN HCL 0.4 MG PO CAPS
0.4000 mg | ORAL_CAPSULE | Freq: Every day | ORAL | Status: DC
  Administered 2016-09-21 – 2016-09-24 (×4): 0.4 mg via ORAL
  Filled 2016-09-21 (×4): qty 1

## 2016-09-21 MED ORDER — HYDRALAZINE HCL 25 MG PO TABS
37.5000 mg | ORAL_TABLET | Freq: Two times a day (BID) | ORAL | Status: DC
  Administered 2016-09-21 – 2016-09-22 (×3): 37.5 mg via ORAL
  Filled 2016-09-21 (×3): qty 2

## 2016-09-21 MED ORDER — ASPIRIN 81 MG PO CHEW: 324.0000 mg | CHEWABLE_TABLET | Freq: Once | ORAL | Status: DC

## 2016-09-21 MED ORDER — ISOSORBIDE DINITRATE 10 MG PO TABS
20.0000 mg | ORAL_TABLET | Freq: Two times a day (BID) | ORAL | Status: DC
  Administered 2016-09-21 – 2016-09-22 (×3): 20 mg via ORAL
  Filled 2016-09-21 (×3): qty 2

## 2016-09-21 MED ORDER — PANTOPRAZOLE SODIUM 40 MG PO TBEC
40.0000 mg | DELAYED_RELEASE_TABLET | Freq: Two times a day (BID) | ORAL | Status: DC
Start: 2016-09-21 — End: 2016-09-25
  Administered 2016-09-21 – 2016-09-25 (×9): 40 mg via ORAL
  Filled 2016-09-21 (×9): qty 1

## 2016-09-21 MED ORDER — SODIUM CHLORIDE 0.9 % IV SOLN
INTRAVENOUS | Status: DC
Start: 2016-09-21 — End: 2016-09-25
  Administered 2016-09-21 – 2016-09-24 (×2): via INTRAVENOUS

## 2016-09-21 MED ORDER — LABETALOL HCL 5 MG/ML IV SOLN
10.0000 mg | Freq: Once | INTRAVENOUS | Status: AC
  Administered 2016-09-21: 10 mg via INTRAVENOUS
  Filled 2016-09-21: qty 4

## 2016-09-21 NOTE — Progress Notes (Signed)
Md made aware of pt's trop 0.35.  No chest pain.  Will continue to monitor Karena Addison T

## 2016-09-21 NOTE — ED Notes (Signed)
Code Stemi activated @ [id number]

## 2016-09-21 NOTE — ED Triage Notes (Signed)
Per EMS, pt from home with c/o chest pain. States chest has felt "funny" x a few days. Hx a-fib with ablation in 2017. Pt had a total of 3 NTG with minimal relief, 324 aspirin given, and 8mg  morphine PTA. Pt on eliquis. BP-220/160, HR-140-180s

## 2016-09-21 NOTE — Progress Notes (Signed)
   09/21/16 0305  Clinical Encounter Type  Visited With Patient and family together  Visit Type Code;ED  Referral From Nurse  Consult/Referral To Chaplain  Spiritual Encounters  Spiritual Needs Other (Comment)  Stress Factors  Patient Stress Factors None identified  Pt. 66 yr old African American Male, Code Stemi, no spiritual needs assessed, pt declined. , ministry of presence.  Chaplain Turquoise Esch A. Kennedy Bucker, MA-PC , [phone]

## 2016-09-21 NOTE — ED Provider Notes (Signed)
[facility]-EMERGENCY DEPT Provider Note   CSN: [id number] Arrival date & time: 09/21/16  0227  By signing my name below, I, Phillips Climes, attest that this documentation has been prepared under the direction and in the presence of Zadie Rhine, MD . Electronically Signed: Phillips Climes, Scribe. 09/21/2016. 2:46 AM.   History   Chief Complaint Chief Complaint  Patient presents with  . Atrial Fibrillation   Cody Brewer is a 65 y.o. male with a PMHx consisting of a-fib s/p ablation, on Eliquis, who presents to the Emergency Department with complaints of chest pain x1 day, which worsened PTA. Pt was given NTG and ASA by EMS en route.  Pt additionally endorses SOB, abdominal pain, throat pain and diaphoresis. No reported LOC or numbness.   LEVEL 5 CAVEAT DUE TO PTs ACUITY.   The history is provided by the patient.  Chest Pain   This is a new problem. The current episode started 6 to 12 hours ago. The problem has been rapidly worsening. The pain is present in the substernal region. The pain is severe. The pain does not radiate. Associated symptoms include abdominal pain and diaphoresis. Pertinent negatives include no numbness and no weakness.    Past Medical History:  Diagnosis Date  . Anemia    Microcytic   . Asthma   . Atrial fibrillation (HCC)   . CHF (congestive heart failure) (HCC)   . Chronic upper back pain   . Coronary artery disease   . Hypercholesteremia   . Hypertension   . Old myocardial infarct    /H&P 04/09/2012 (05/06/2012)  . SVT (supraventricular tachycardia) [facility])     Patient Active Problem List   Diagnosis Date Noted  . Syncope 10/08/2013  . Nonischemic cardiomyopathy - EF 25-30%. nonobstructive CAD on [facility] 09/28/12 09/29/2013  . Systolic CHF (HCC) 09/29/2013  . HTN (hypertension) 09/29/2013  . Atrial fibrillation (HCC) 09/24/2013  . Leukocytosis 04/08/2012  . Hypokalemia 04/08/2012  . Anxiety 04/08/2012  . Demand ischemia (HCC) 04/07/2012  .  Paroxysmal SVT (supraventricular tachycardia) (HCC) 04/07/2012  . CAD 02/12/2009  . HYPERLIPIDEMIA 11/11/2008  . Chronic Microcytic anemia 11/11/2008  . Essential hypertension 11/11/2008  . GERD 11/11/2008  . CHEST PAIN 11/11/2008  . MICROCYTOSIS 11/11/2008    Past Surgical History:  Procedure Laterality Date  . CARDIAC CATHETERIZATION  04/08/2012  . CARDIOVERSION N/A 09/27/2013   Procedure: CARDIOVERSION;  Surgeon: Vesta Mixer, MD;  Location: [facility] ENDOSCOPY;  Service: Cardiovascular;  Laterality: N/A;  . EXCISIONAL HEMORRHOIDECTOMY  ~ 2007  . LEFT AND RIGHT HEART CATHETERIZATION WITH CORONARY ANGIOGRAM N/A 09/28/2013   Procedure: LEFT AND RIGHT HEART CATHETERIZATION WITH CORONARY ANGIOGRAM;  Surgeon: Iran Ouch, MD;  Location: [facility] CATH LAB;  Service: Cardiovascular;  Laterality: N/A;  . LEFT HEART CATHETERIZATION WITH CORONARY ANGIOGRAM N/A 04/08/2012   Procedure: LEFT HEART CATHETERIZATION WITH CORONARY ANGIOGRAM;  Surgeon: Kathleene Hazel, MD;  Location: [facility] CATH LAB;  Service: Cardiovascular;  Laterality: N/A;  . SUPRAVENTRICULAR TACHYCARDIA ABLATION  05/06/2012  . SUPRAVENTRICULAR TACHYCARDIA ABLATION N/A 05/06/2012   Procedure: SUPRAVENTRICULAR TACHYCARDIA ABLATION;  Surgeon: Marinus Maw, MD;  Location: [facility] CATH LAB;  Service: Cardiovascular;  Laterality: N/A;  . TEE WITHOUT CARDIOVERSION N/A 09/27/2013   Procedure: TRANSESOPHAGEAL ECHOCARDIOGRAM (TEE);  Surgeon: Vesta Mixer, MD;  Location: [facility] ENDOSCOPY;  Service: Cardiovascular;  Laterality: N/A;       Home Medications    Prior to Admission medications   Medication Sig Start Date End Date Taking?  Authorizing Provider  carvedilol (COREG) 25 MG tablet Take 25 mg by mouth 2 (two) times daily with a meal.    [provider]  ELIQUIS 5 MG TABS tablet TAKE 1 TABLET (5 MG TOTAL) BY MOUTH 2 (TWO) TIMES DAILY. 03/10/16   Marinus Maw, MD  famotidine (PEPCID) 20 MG tablet Take 1 tablet (20 mg total) by  mouth 2 (two) times daily. 04/09/12   Russella Dar, NP  furosemide (LASIX) 40 MG tablet Take 1 tablet (40 mg total) by mouth daily. 04/21/16   Marinus Maw, MD  hydrALAZINE (APRESOLINE) 25 MG tablet Take 1.5 tablets (37.5 mg total) by mouth 2 (two) times daily. 07/29/16   Marinus Maw, MD  isosorbide dinitrate (ISORDIL) 20 MG tablet Take 1 tablet (20 mg total) by mouth 2 (two) times daily. 07/29/16   Marinus Maw, MD  lisinopril (PRINIVIL,ZESTRIL) 10 MG tablet TAKE 1 TABLET (10 MG TOTAL) BY MOUTH DAILY. 07/16/16   Marinus Maw, MD  nitroGLYCERIN (NITROSTAT) 0.4 MG SL tablet Place 0.4 mg under the tongue every 5 (five) minutes x 3 doses as needed for chest pain. 03/11/16   [provider]  pantoprazole (PROTONIX) 40 MG tablet Take 1 tablet by mouth 2 (two) times daily. 12/28/15   [provider]  polyethylene glycol (MIRALAX / GLYCOLAX) packet Take 17 g by mouth daily as needed for moderate constipation.    [provider]  tamsulosin (FLOMAX) 0.4 MG CAPS capsule Take 1 capsule (0.4 mg total) by mouth daily after supper. 10/10/13   Orpah Cobb, MD    Family History Family History  Problem Relation Age of Onset  . Diabetes Mother   . Stroke Father   . Cancer Sister   . Cancer Brother   . Cancer Brother   . Cancer Brother   . Cancer Sister   . Cancer Brother   . Heart disease Brother     Social History Social History  Substance Use Topics  . Smoking status: Former Smoker    Years: 4.00    Types: Cigarettes  . Smokeless tobacco: Never Used     Comment: 05/06/2012 "quit in the 1990's; never smoked much"  . Alcohol use 1.2 oz/week    2 Cans of beer per week     Allergies   Patient has no known allergies.   Review of Systems Review of Systems  Unable to perform ROS: Acuity of condition  Constitutional: Positive for diaphoresis.  Cardiovascular: Positive for chest pain.  Gastrointestinal: Positive for abdominal pain.  Neurological:  Negative for weakness and numbness.    LEVEL 5 CAVEAT DUE TO PTs ACUITY.   Physical Exam Updated Vital Signs BP (!) 166/124 (BP Location: Right Arm)   Pulse (!) 138   Temp 97.3 F (36.3 C) (Oral)   Resp 10   Ht 6\' 1"  (1.854 m)   Wt 190 lb (86.2 kg)   SpO2 97%   BMI 25.07 kg/m   Physical Exam CONSTITUTIONAL: anxious and agitated Diaphoretic and ill-appearing.  HEAD: Normocephalic/atraumatic EYES: EOMI ENMT: Mucous membranes moist NECK: supple no meningeal signs SPINE/BACK:entire spine nontender CV: Tachycardic, irregular and murmur noted.  LUNGS: Lungs are clear to auscultation bilaterally, no apparent distress ABDOMEN: soft, nontender, no rebound or guarding, bowel sounds noted throughout abdomen GU:no cva tenderness NEURO: Pt is awake/alert/appropriate, moves all extremitiesx4.  No facial droop.   EXTREMITIES: pulses normal/equal x4, full ROM SKIN: warm, color normal PSYCH: Anxious and agitated   ED Treatments /  Results  DIAGNOSTIC STUDIES: Oxygen Saturation is 97% on RA, nl by my interpretation.    COORDINATION OF CARE: 2:48 AM Discussed treatment plan with pt at bedside and pt agreed to plan.   Labs (all labs ordered are listed, but only abnormal results are displayed) Labs Reviewed  BASIC METABOLIC PANEL - Abnormal; Notable for the following:       Result Value   CO2 20 (*)    Glucose, Bld 150 (*)    All other components within normal limits  CBC WITH DIFFERENTIAL/PLATELET - Abnormal; Notable for the following:    WBC 12.6 (*)    RBC 5.99 (*)    MCH 25.9 (*)    Neutro Abs 8.6 (*)    All other components within normal limits  I-STAT TROPOININ, ED - Abnormal; Notable for the following:    Troponin i, poc 0.09 (*)    All other components within normal limits  PROTIME-INR  APTT  TROPONIN I  LIPID PANEL    EKG  EKG Interpretation  Date/Time:  Sunday Sep 21 2016 02:33:33 EDT Ventricular Rate:  145 PR Interval:    QRS Duration: 154 QT  Interval:  340 QTC Calculation: 529 R Axis:   -51 Text Interpretation:  Atrial flutter with predominant 2:1 AV block Ventricular premature complex IVCD, consider atypical RBBB LVH with IVCD and secondary repol abnrm ST depr, consider ischemia, inferior leads Prolonged QT interval Confirmed by Zadie Rhine ([id number]) on 09/21/2016 2:36:18 AM       Radiology Dg Chest Port 1 View  Result Date: 09/21/2016 CLINICAL DATA:  Chest discomfort for several days. EXAM: PORTABLE CHEST 1 VIEW COMPARISON:  03/14/2016 FINDINGS: A single AP portable view of the chest demonstrates no focal airspace consolidation or alveolar edema. The lungs are grossly clear. There is no large effusion or pneumothorax. Cardiac and mediastinal contours appear unremarkable. IMPRESSION: No active disease. Electronically Signed   By: Ellery Plunk M.D.   On: 09/21/2016 02:50    Procedures Procedures  CRITICAL CARE Performed by: Joya Gaskins Total critical care time: 33 minutes Critical care time was exclusive of separately billable procedures and treating other patients. Critical care was necessary to treat or prevent imminent or life-threatening deterioration. Critical care was time spent personally by me on the following activities: development of treatment plan with patient and/or surrogate as well as nursing, discussions with consultants, evaluation of patient's response to treatment, examination of patient, obtaining history from patient or surrogate, ordering and performing treatments and interventions, ordering and review of laboratory studies, ordering and review of radiographic studies, pulse oximetry and re-evaluation of patient's condition. PATIENT WITH ATRIAL FIBRILLATION WITH RVR REQUIRING MONITORING, HE RECEIVED AMIODARONE  Medications Ordered in ED Medications  0.9 %  sodium chloride infusion (not administered)  aspirin chewable tablet 324 mg (not administered)  amiodarone (NEXTERONE) IV bolus only 150  mg/100 mL (not administered)     Initial Impression / Assessment and Plan / ED Course  I have reviewed the triage vital signs and the nursing notes.  Pertinent labs & imaging results that were available during my care of the patient were reviewed by me and considered in my medical decision making (see chart for details).     2:58 AM Pt seen soon after arrival He is agitated, diaphoretic and reporting CP/heartburn EKG reveals ST elevation in V1/V2 D/w dr Hassell Halim with cardiology He requests give amiodarone bolus for rate control to see if ST changes improve.    3:00 AM Cardiology fellow at  bedside Dr Virgina Organ He has reviewed EKG Will give amiodarone bolus to see if rate improves Then repeat ekg 3:34 AM  EKG Interpretation  Date/Time:  Sunday Sep 21 2016 02:42:57 EDT Ventricular Rate:  134 PR Interval:    QRS Duration: 153 QT Interval:  348 QTC Calculation: 520 R Axis:   -42 Text Interpretation:  Atrial fibrillation RBBB and LAFB LVH with secondary repolarization abnormality Abnormal ekg ST elevation in V1/V2 Confirmed by Zadie Rhine ([id number]) on 09/21/2016 2:55:15 AM     BP (!) 193/171   Pulse 61   Temp 97.3 F (36.3 C) (Oral)   Resp 11   Ht 6\' 1"  (1.854 m)   Wt 86.2 kg   SpO2 100%   BMI 25.07 kg/m   PT IMPROVED HEART RATE IMPROVED HE IS NOT AS DISTRESSED AT THIS TIME SEEN BY CARDIOLOGY AND CODE STEMI CANCELLED CARDIOLOGY WILL ADMIT Final Clinical Impressions(s) / ED Diagnoses   Final diagnoses:  Hypertensive emergency  Atrial fibrillation with RVR (HCC)    New Prescriptions New Prescriptions   No medications on file   I personally performed the services described in this documentation, which was scribed in my presence. The recorded information has been reviewed and is accurate.        Zadie Rhine, MD 09/21/16 [phone]

## 2016-09-21 NOTE — ED Notes (Signed)
Dr. Gae Gallop , Luretha Rued notified on pt.'s elevated troponin result , no new order received .

## 2016-09-21 NOTE — H&P (Signed)
CARDIOLOGY INPATIENT HISTORY AND PHYSICAL EXAMINATION NOTE  Patient ID: Cody Brewer MRN: [id number], DOB/AGE: 65-Oct-1952   Admit date: 09/21/2016   Primary Physician: [name], MD Primary Cardiologist: Sharrell Ku MD  Reason for admission: chest pain  HPI: This is a 66 y.o. 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  Patient started having CP 4 hours ago with rapid palpitations and SOB. CP described as heart burn radiating to throat. He does not know his exact medications but took his medications in the morning. In the ED, St elevation was observed in lead V1 and code stemi was called. However it was in the setting of LBBB/LVH. Once the HR improved, patient's symptoms also improved. He was given a bolus of IV amiodarone. Pt saw Dr. Ladona Ridgel in January 2018. At that time, chest pain was being conservatively managed with medical therapy. Patient remained severely hypertensive in the ED. He was given a dose of labetolol as well. Patient will be admitted to step down unit for management.   Problem List: Past Medical History:  Diagnosis Date  . Anemia    Microcytic   . Asthma   . Atrial fibrillation (HCC)   . CHF (congestive heart failure) (HCC)   . Chronic upper back pain   . Coronary artery disease   . Hypercholesteremia   . Hypertension   . Old myocardial infarct    /H&P 04/09/2012 (05/06/2012)  . SVT (supraventricular tachycardia) (HCC)     Past Surgical History:  Procedure Laterality Date  . CARDIAC CATHETERIZATION  04/08/2012  . CARDIOVERSION N/A 09/27/2013   Procedure: CARDIOVERSION;  Surgeon: Vesta Mixer, MD;  Location: [facility] ENDOSCOPY;  Service: Cardiovascular;  Laterality: N/A;  . EXCISIONAL HEMORRHOIDECTOMY  ~ 2007  . LEFT AND RIGHT HEART CATHETERIZATION WITH CORONARY ANGIOGRAM N/A 09/28/2013   Procedure: LEFT AND RIGHT HEART CATHETERIZATION WITH  CORONARY ANGIOGRAM;  Surgeon: Iran Ouch, MD;  Location: [facility] CATH LAB;  Service: Cardiovascular;  Laterality: N/A;  . LEFT HEART CATHETERIZATION WITH CORONARY ANGIOGRAM N/A 04/08/2012   Procedure: LEFT HEART CATHETERIZATION WITH CORONARY ANGIOGRAM;  Surgeon: Kathleene Hazel, MD;  Location: [facility] CATH LAB;  Service: Cardiovascular;  Laterality: N/A;  . SUPRAVENTRICULAR TACHYCARDIA ABLATION  05/06/2012  . SUPRAVENTRICULAR TACHYCARDIA ABLATION N/A 05/06/2012   Procedure: SUPRAVENTRICULAR TACHYCARDIA ABLATION;  Surgeon: Marinus Maw, MD;  Location: [facility] CATH LAB;  Service: Cardiovascular;  Laterality: N/A;  . TEE WITHOUT CARDIOVERSION N/A 09/27/2013   Procedure: TRANSESOPHAGEAL ECHOCARDIOGRAM (TEE);  Surgeon: Vesta Mixer, MD;  Location: [facility] ENDOSCOPY;  Service: Cardiovascular;  Laterality: N/A;     Allergies: No Known Allergies   Home Medications Current Facility-Administered Medications  Medication Dose Route Frequency Provider Last Rate Last Dose  . 0.9 %  sodium chloride infusion   Intravenous Continuous Zadie Rhine, MD 20 mL/hr at 09/21/16 0302    . aspirin chewable tablet 324 mg  324 mg Oral Once Zadie Rhine, MD       Current Outpatient Prescriptions  Medication Sig Dispense Refill  . carvedilol (COREG) 25 MG tablet Take 25 mg by mouth 2 (two) times daily with a meal.    . ELIQUIS 5 MG TABS tablet TAKE 1 TABLET (5 MG TOTAL) BY MOUTH 2 (TWO) TIMES DAILY. 60 tablet 0  . furosemide (LASIX) 40 MG tablet Take 1 tablet (40 mg total) by mouth daily. 30 tablet 11  . isosorbide-hydrALAZINE (  BIDIL) 20-37.5 MG tablet Take 1 tablet by mouth 2 (two) times daily.    Marland Kitchen lisinopril (PRINIVIL,ZESTRIL) 10 MG tablet TAKE 1 TABLET (10 MG TOTAL) BY MOUTH DAILY. 30 tablet 9  . nitroGLYCERIN (NITROSTAT) 0.4 MG SL tablet Place 0.4 mg under the tongue every 5 (five) minutes x 3 doses as needed for chest pain.    Marland Kitchen sucralfate (CARAFATE) 1 g tablet Take 1 g by mouth 4 (four) times daily.    .  tamsulosin (FLOMAX) 0.4 MG CAPS capsule Take 1 capsule (0.4 mg total) by mouth daily after supper. 30 capsule   . famotidine (PEPCID) 20 MG tablet Take 1 tablet (20 mg total) by mouth 2 (two) times daily. 60 tablet 1  . hydrALAZINE (APRESOLINE) 25 MG tablet Take 1.5 tablets (37.5 mg total) by mouth 2 (two) times daily. 90 tablet 6  . isosorbide dinitrate (ISORDIL) 20 MG tablet Take 1 tablet (20 mg total) by mouth 2 (two) times daily. 60 tablet 6  . pantoprazole (PROTONIX) 40 MG tablet Take 1 tablet by mouth 2 (two) times daily.    . polyethylene glycol (MIRALAX / GLYCOLAX) packet Take 17 g by mouth daily as needed for moderate constipation.       Family History  Problem Relation Age of Onset  . Diabetes Mother   . Stroke Father   . Cancer Sister   . Cancer Brother   . Cancer Brother   . Cancer Brother   . Cancer Sister   . Cancer Brother   . Heart disease Brother      Social History   Social History  . Marital status: Married    Spouse name: N/A  . Number of children: N/A  . Years of education: N/A   Occupational History  . Not on file.   Social History Main Topics  . Smoking status: Former Smoker    Years: 4.00    Types: Cigarettes  . Smokeless tobacco: Never Used     Comment: 05/06/2012 "quit in the 1990's; never smoked much"  . Alcohol use 1.2 oz/week    2 Cans of beer per week  . Drug use: Yes    Types: Marijuana     Comment: 05/06/2012 "when I was younger I probably  smoked pot"  . Sexual activity: Not Currently   Other Topics Concern  . Not on file   Social History Narrative  . No narrative on file     Review of Systems: General: negative for chills, fever, night sweats or weight changes.  Cardiovascular: chest pain, dyspnea palpitations  Dermatological: negative for rash Respiratory: negative for cough or wheezing Urologic: negative for hematuria Abdominal: heart burn  Neurologic: negative for visual changes, syncope, or dizziness Endocrine: no  diabetes, no hypothyroidism Immunological: no lymph adenopathy Psych: non homicidal/suicidal  Physical Exam: Vitals: BP (!) 193/160   Pulse (!) 36   Temp 97.3 F (36.3 C) (Oral)   Resp (!) 28   Ht 6\' 1"  (1.854 m)   Wt 86.2 kg (190 lb)   SpO2 100%   BMI 25.07 kg/m  General: not in acute distress Neck: JVP 5, neck supple Heart: irregular rate and rhythm, S1, S2, early systolic harsh murmur probably flow related Lungs: CTAB  GI: non tender, non distended, bowel sounds present Extremities: no edema Neuro: AAO x 3  Psych: normal affect, no anxiety   Labs:   Results for orders placed or performed during the hospital encounter of 09/21/16 (from the past 24 hour(s))  Basic metabolic panel     Status: Abnormal   Collection Time: 09/21/16  2:37 AM  Result Value Ref Range   Sodium 138 135 - 145 mmol/L   Potassium 4.8 3.5 - 5.1 mmol/L   Chloride 104 101 - 111 mmol/L   CO2 20 (L) 22 - 32 mmol/L   Glucose, Bld 150 (H) 65 - 99 mg/dL   BUN 16 6 - 20 mg/dL   Creatinine, Ser 1.61 0.61 - 1.24 mg/dL   Calcium 9.2 8.9 - 09.6 mg/dL   GFR calc non Af Amer >60 >60 mL/min   GFR calc Af Amer >60 >60 mL/min   Anion gap 14 5 - 15  CBC with Differential/Platelet     Status: Abnormal   Collection Time: 09/21/16  2:37 AM  Result Value Ref Range   WBC 12.6 (H) 4.0 - 10.5 K/uL   RBC 5.99 (H) 4.22 - 5.81 MIL/uL   Hemoglobin 15.5 13.0 - 17.0 g/dL   HCT 04.5 40.9 - 81.1 %   MCV 78.0 78.0 - 100.0 fL   MCH 25.9 (L) 26.0 - 34.0 pg   MCHC 33.2 30.0 - 36.0 g/dL   RDW 91.4 78.2 - 95.6 %   Platelets 269 150 - 400 K/uL   Neutrophils Relative % 69 %   Neutro Abs 8.6 (H) 1.7 - 7.7 K/uL   Lymphocytes Relative 24 %   Lymphs Abs 3.0 0.7 - 4.0 K/uL   Monocytes Relative 6 %   Monocytes Absolute 0.8 0.1 - 1.0 K/uL   Eosinophils Relative 1 %   Eosinophils Absolute 0.1 0.0 - 0.7 K/uL   Basophils Relative 0 %   Basophils Absolute 0.0 0.0 - 0.1 K/uL  Troponin I     Status: Abnormal   Collection Time:  09/21/16  2:37 AM  Result Value Ref Range   Troponin I 0.05 (HH) <0.03 ng/mL  Lipid panel     Status: Abnormal   Collection Time: 09/21/16  2:37 AM  Result Value Ref Range   Cholesterol 186 0 - 200 mg/dL   Triglycerides 213 <086 mg/dL   HDL 59 >57 mg/dL   Total CHOL/HDL Ratio 3.2 RATIO   VLDL 21 0 - 40 mg/dL   LDL Cholesterol 846 (H) 0 - 99 mg/dL  I-stat troponin, ED     Status: Abnormal   Collection Time: 09/21/16  2:46 AM  Result Value Ref Range   Troponin i, poc 0.09 (HH) 0.00 - 0.08 ng/mL   Comment NOTIFIED PHYSICIAN    Comment 3             Radiology/Studies: Dg Chest Port 1 View  Result Date: 09/21/2016 CLINICAL DATA:  Chest discomfort for several days. EXAM: PORTABLE CHEST 1 VIEW COMPARISON:  03/14/2016 FINDINGS: A single AP portable view of the chest demonstrates no focal airspace consolidation or alveolar edema. The lungs are grossly clear. There is no large effusion or pneumothorax. Cardiac and mediastinal contours appear unremarkable. IMPRESSION: No active disease. Electronically Signed   By: Ellery Plunk M.D.   On: 09/21/2016 02:50    EKG: atrial fibrillation with RVR LBBB with ST depression in the lateral leads suggestive LV strain pattern   Echo: 05/06/2016 - Left ventricle: The cavity size was mildly dilated. There was   mild concentric hypertrophy. Systolic function was normal. The   estimated ejection fraction was in the range of 55% to 60%. Wall   motion was normal; there were no regional wall motion   abnormalities. Doppler parameters  are consistent with abnormal   left ventricular relaxation (grade 1 diastolic dysfunction).   Doppler parameters are consistent with high ventricular filling   pressure. - Aortic valve: Transvalvular velocity was within the normal range.   There was no stenosis. There was no regurgitation. - Mitral valve: Transvalvular velocity was within the normal range.   There was no evidence for stenosis. There was mild regurgitation    in early systole. - Left atrium: The atrium was moderately dilated. - Right ventricle: The cavity size was normal. Wall thickness was   normal. Systolic function was normal. - Tricuspid valve: There was mild regurgitation. - Pulmonary arteries: Systolic pressure was at the upper limits of   normal. PA peak pressure: 37 mm Hg (S).  Nuclear stress test 04/16/2016  Nuclear stress EF: 24%.  There was no ST segment deviation noted during stress.  The left ventricular ejection fraction is severely decreased (<30%).  This is a high risk study based on the severe LV dysfunction. There is no evidence of infarction or ischemia  Cardiac cath: 09/28/2013  Left Main:  Normal  Left Anterior Descending (LAD):  Normal in size with minor irregularities distally.  1st diagonal (D1):  Normal in size with 20% proximal stenosis.  2nd diagonal (D2):  Large in size with no significant disease.  3rd diagonal (D3):  Normal in size with minor irregularities.  Circumflex (LCx):  Normal in size and nondominant. The vessel has no significant disease.  1st obtuse marginal:  Small in size with no significant disease.  2nd obtuse marginal:  Normal in size with no significant disease.  3rd obtuse marginal:  Medium in size with no significant disease.      AV groove continuation segment: Normal.  Right Coronary Artery: Normal in size and dominant. There is 20% proximal stenosis. The rest of the vessel is free of significant disease.  Posterior descending artery: Normal in size with no significant disease.  Posterior AV segment: Normal  Posterolateral branchs:  Normal  Left ventriculography: Left ventricular systolic function is severely reduced , LVEF is estimated at 25-30  %, there is no significant mitral regurgitation . There is severe hypokinesis in the basal segments.  Final Conclusions:   1. Moderately elevated filling pressures with normal cardiac output 2. No evidence of obstructive  coronary artery disease with only minor irregularities. 3. Severely reduced LV systolic function with an ejection fraction of 25-30%.  Medical decision making:  Discussed care with the patient Discussed care with the physician on the phone Reviewed labs and imaging personally Reviewed prior records  ASSESSMENT AND PLAN:  This is a 66 y.o. male with non obstructive non ischemic cardiomyopathy, s/p SVT ablation, symptomatic atrial flutter.   Principal Problem:   Hypertensive emergency without congestive heart failure Active Problems:   GERD   CHEST PAIN   Demand ischemia (HCC)   Atrial fibrillation with RVR (HCC)   Chronic combined systolic and diastolic heart failure, NYHA class 2 (HCC)  Hypertensive emergency with elevated troponin, LV strain on ECG, chest pain - will administer labetolol IV 10 mg, and start on diltiazem drip - restart home medications  Chest pain, could be secondary to microvascular disease, recent stress test did not demonstrate any infarction/ischemia, review of 2015 cath showed no significant disease, ECG did not demonstrate ST elevation in the setting of LBBB - cycle troponin, admit to step down, control BP and HR, reviewed echocardiogram from 2017, aspirin, continue bidil, coreg, diltiazem for angina. Morphine prn   Atrial  fibrillation with RVR complicated by chest pain, CHADS2VASc score = 2 (age, htn) - unclear about compliance of the medications, Keep potassium >4, calcium >9, magnesium >2, checking TSH, amiodarone bolus given in the ED, will continue with diltiazem drip for now to achieve HR <110  Chronic systolic and diastolic heart failure, NYHA class II improved LVEF by last echocardiogram  - continue bidil, lisinopril, aspirin, control bp  GERD - continue protonix  Elevated troponin likely secondary to demand ischemia, cycle troponin  Signed, Joellyn Rued, MD MS 09/21/2016, 3:42 AM

## 2016-09-22 DIAGNOSIS — I5043 Acute on chronic combined systolic (congestive) and diastolic (congestive) heart failure: Secondary | ICD-10-CM

## 2016-09-22 LAB — HEPARIN LEVEL (UNFRACTIONATED): Heparin Unfractionated: 0.83 IU/mL — ABNORMAL HIGH (ref 0.30–0.70)

## 2016-09-22 LAB — HEMOGLOBIN A1C
Hgb A1c MFr Bld: 5.8 % — ABNORMAL HIGH (ref 4.8–5.6)
Mean Plasma Glucose: 120 mg/dL

## 2016-09-22 LAB — APTT: APTT: 51 s — AB (ref 24–36)

## 2016-09-22 LAB — GLUCOSE, CAPILLARY: Glucose-Capillary: 104 mg/dL — ABNORMAL HIGH (ref 65–99)

## 2016-09-22 MED ORDER — ATORVASTATIN CALCIUM 40 MG PO TABS
40.0000 mg | ORAL_TABLET | Freq: Every day | ORAL | Status: DC
  Administered 2016-09-22 – 2016-09-24 (×3): 40 mg via ORAL
  Filled 2016-09-22 (×3): qty 1

## 2016-09-22 MED ORDER — LORATADINE 10 MG PO TABS
10.0000 mg | ORAL_TABLET | Freq: Every day | ORAL | Status: DC | PRN
  Administered 2016-09-22: 10 mg via ORAL
  Filled 2016-09-22: qty 1

## 2016-09-22 MED ORDER — ISOSORBIDE DINITRATE 10 MG PO TABS
20.0000 mg | ORAL_TABLET | Freq: Two times a day (BID) | ORAL | Status: DC
  Administered 2016-09-22 – 2016-09-25 (×6): 20 mg via ORAL
  Filled 2016-09-22 (×6): qty 2

## 2016-09-22 MED ORDER — ISOSORBIDE DINITRATE 10 MG PO TABS: 20.0000 mg | ORAL_TABLET | Freq: Three times a day (TID) | ORAL | Status: DC

## 2016-09-22 MED ORDER — HEPARIN (PORCINE) IN NACL 100-0.45 UNIT/ML-% IJ SOLN
1400.0000 [IU]/h | INTRAMUSCULAR | Status: DC
  Administered 2016-09-22 – 2016-09-23 (×2): 1400 [IU]/h via INTRAVENOUS
  Filled 2016-09-22: qty 250

## 2016-09-22 MED ORDER — HYDRALAZINE HCL 25 MG PO TABS
37.5000 mg | ORAL_TABLET | Freq: Three times a day (TID) | ORAL | Status: DC
  Administered 2016-09-22 – 2016-09-25 (×9): 37.5 mg via ORAL
  Filled 2016-09-22 (×9): qty 2

## 2016-09-22 MED ORDER — LISINOPRIL 10 MG PO TABS
10.0000 mg | ORAL_TABLET | Freq: Every day | ORAL | Status: DC
  Administered 2016-09-23 – 2016-09-25 (×3): 10 mg via ORAL
  Filled 2016-09-22 (×3): qty 1

## 2016-09-22 MED ORDER — DILTIAZEM HCL ER 60 MG PO CP12
120.0000 mg | ORAL_CAPSULE | Freq: Two times a day (BID) | ORAL | Status: DC
  Administered 2016-09-22 (×2): 120 mg via ORAL
  Filled 2016-09-22 (×3): qty 2

## 2016-09-22 MED ORDER — FUROSEMIDE 10 MG/ML IJ SOLN
40.0000 mg | Freq: Two times a day (BID) | INTRAMUSCULAR | Status: DC
Start: 2016-09-22 — End: 2016-09-23
  Administered 2016-09-22: 40 mg via INTRAVENOUS
  Filled 2016-09-22 (×2): qty 4

## 2016-09-22 MED ORDER — LISINOPRIL 20 MG PO TABS: 40.0000 mg | ORAL_TABLET | Freq: Every day | ORAL | Status: DC

## 2016-09-22 MED ORDER — HEPARIN (PORCINE) IN NACL 100-0.45 UNIT/ML-% IJ SOLN
1200.0000 [IU]/h | INTRAMUSCULAR | Status: DC
  Administered 2016-09-22: 1200 [IU]/h via INTRAVENOUS
  Filled 2016-09-22: qty 250

## 2016-09-22 NOTE — Progress Notes (Signed)
ANTICOAGULATION CONSULT NOTE  Pharmacy Consult for Heparin Indication: chest pain/ACS and atrial fibrillation  No Known Allergies  Patient Measurements: Height: 6\' 1"  (185.4 cm) Weight: 190 lb (86.2 kg) IBW/kg (Calculated) : 79.9  Vital Signs: Temp: 98.7 F (37.1 C) (05/07 1702) Temp Source: Oral (05/07 1702) BP: 152/111 (05/07 1800) Pulse Rate: 98 (05/07 1702)  Labs:  Recent Labs  09/21/16 0237 09/21/16 0519 09/21/16 0959 09/21/16 1747 09/22/16 1830  HGB 15.5 14.7  --   --   --   HCT 46.7 44.1  --   --   --   PLT 269 225  --   --   --   APTT 37*  --   --   --  51*  LABPROT 14.9 15.9*  --   --   --   INR 1.17 1.26  --   --   --   HEPARINUNFRC  --   --   --   --  0.83*  CREATININE 1.04 0.97  --   --   --   TROPONINI 0.05* 0.35* 1.12* 1.69*  --     Estimated Creatinine Clearance: 85.8 mL/min (by C-G formula based on SCr of 0.97 mg/dL).   Medical History: Past Medical History:  Diagnosis Date  . Anemia    Microcytic   . Anxiety   . Asthma   . Atrial fibrillation (HCC)   . CHF (congestive heart failure) (HCC)   . Chronic upper back pain   . Coronary artery disease   . Depression   . Dysrhythmia   . Hypercholesteremia   . Hypertension   . Old myocardial infarct    /H&P 04/09/2012 (05/06/2012)  . SVT (supraventricular tachycardia) (HCC)    . sodium chloride Stopped (09/22/16 1700)  . heparin 1,200 Units/hr (09/22/16 1231)     Assessment: 66yom on eliquis for afib, now to transition to heparin for NSTEMI with plan for cath tomorrow. Last eliquis dose 5/6 @ 2101. Will use aPTTs to monitor heparin as eliquis falsely elevates heparin levels. CBC, sCr wnl.  Initial heparin level falsely elevated as expected from recent Eliquis.  APTT slightly below goal.  No bleeding or complications noted.  Goal of Therapy:  APTT 66-102 seconds Heparin level 0.3-0.7 units/ml Monitor platelets by anticoagulation protocol: Yes   Plan:  1) Increase IV heparin to 1400  units/hr. 2) Check 6 hour aPTT and heparin level 3) Daily aPTT, heparin level, CBC  Tad Moore, BCPS  Clinical Pharmacist Pager [phone]  09/22/2016 7:16 PM

## 2016-09-22 NOTE — Progress Notes (Addendum)
Patient Name: Cody Brewer Date of Encounter: 09/22/2016  Principal Problem:   Hypertensive emergency without congestive heart failure Active Problems:   GERD   CHEST PAIN   Demand ischemia (HCC)   Atrial fibrillation with RVR (HCC)   Chronic combined systolic and diastolic heart failure, NYHA class 2 (HCC)   Length of Stay: 1  SUBJECTIVE  The patient feels weak, no chest pain, mild SOB.   CURRENT MEDS . apixaban  5 mg Oral BID  . aspirin  324 mg Oral Once  . aspirin EC  81 mg Oral Daily  . carvedilol  25 mg Oral BID WC  . famotidine  20 mg Oral BID  . furosemide  40 mg Oral Daily  . hydrALAZINE  37.5 mg Oral BID  . isosorbide dinitrate  20 mg Oral BID  . lisinopril  10 mg Oral Daily  . pantoprazole  40 mg Oral BID  . sodium chloride flush  3 mL Intravenous Q12H  . sucralfate  1 g Oral QID  . tamsulosin  0.4 mg Oral QPC supper    OBJECTIVE  Vitals:   09/22/16 0720 09/22/16 0730 09/22/16 0743 09/22/16 0800  BP: (!) 187/106 (!) 205/131 (!) 168/112 (!) 173/143  Pulse: 73 (!) 44 74 96  Resp: (!) 22 (!) 23 20 (!) 27  Temp: 98.8 F (37.1 C)     TempSrc: Oral     SpO2: 91% 96% 94% 90%  Weight:      Height:        Intake/Output Summary (Last 24 hours) at 09/22/16 0923 Last data filed at 09/22/16 0800  Gross per 24 hour  Intake           805.82 ml  Output             1950 ml  Net         -1144.18 ml   Filed Weights   09/21/16 0234 09/21/16 0433 09/22/16 0335  Weight: 190 lb (86.2 kg) 188 lb 6.4 oz (85.5 kg) 190 lb (86.2 kg)    PHYSICAL EXAM  General: Pleasant, NAD. Neuro: Alert and oriented X 3. Moves all extremities spontaneously. Psych: Normal affect. HEENT:  Normal  Neck: Supple without bruits or JVD. Lungs:  Resp regular and unlabored, crackles at bases.  Heart: RRR no s3, s4, or murmurs. Abdomen: Soft, non-tender, non-distended, BS + x 4.  Extremities: No clubbing, cyanosis, mild B/L LE edema. DP/PT/Radials 2+ and equal  bilaterally.  Accessory Clinical Findings  CBC  Recent Labs  09/21/16 0237 09/21/16 0519  WBC 12.6* 10.7*  NEUTROABS 8.6*  --   HGB 15.5 14.7  HCT 46.7 44.1  MCV 78.0 77.8*  PLT 269 225   Basic Metabolic Panel  Recent Labs  09/21/16 0237 09/21/16 0519  NA 138 138  K 4.8 3.7  CL 104 104  CO2 20* 23  GLUCOSE 150* 143*  BUN 16 16  CREATININE 1.04 0.97  CALCIUM 9.2 8.9   Liver Function Tests  Recent Labs  09/21/16 0519  AST 34  ALT 32  ALKPHOS 69  BILITOT 1.3*  PROT 6.9  ALBUMIN 3.6   No results for input(s): LIPASE, AMYLASE in the last 72 hours. Cardiac Enzymes  Recent Labs  09/21/16 0519 09/21/16 0959 09/21/16 1747  TROPONINI 0.35* 1.12* 1.69*    Recent Labs  09/21/16 0519  HGBA1C 5.8*   Fasting Lipid Panel  Recent Labs  09/21/16 0519  CHOL 184  HDL 57  LDLCALC  111*  TRIG 80  CHOLHDL 3.2   Thyroid Function Tests  Recent Labs  09/21/16 0519  TSH 1.715   Radiology/Studies  Dg Chest Port 1 View  Result Date: 09/21/2016 CLINICAL DATA:  Chest discomfort for several days. EXAM: PORTABLE CHEST 1 VIEW COMPARISON:  03/14/2016 FINDINGS: A single AP portable view of the chest demonstrates no focal airspace consolidation or alveolar edema. The lungs are grossly clear. There is no large effusion or pneumothorax. Cardiac and mediastinal contours appear unremarkable. IMPRESSION: No active disease. Electronically Signed   By: Ellery Plunk M.D.   On: 09/21/2016 02:50   TELE: personally reviewed, a-fib with RVR up to 127 BPM  TTE: 04/2016  - Left ventricle: The cavity size was mildly dilated. There was   mild concentric hypertrophy. Systolic function was normal. The   estimated ejection fraction was in the range of 55% to 60%. Wall   motion was normal; there were no regional wall motion   abnormalities. Doppler parameters are consistent with abnormal   left ventricular relaxation (grade 1 diastolic dysfunction).   Doppler parameters are  consistent with high ventricular filling   pressure. - Aortic valve: Transvalvular velocity was within the normal range.   There was no stenosis. There was no regurgitation. - Mitral valve: Transvalvular velocity was within the normal range.   There was no evidence for stenosis. There was mild regurgitation   in early systole. - Left atrium: The atrium was moderately dilated. - Right ventricle: The cavity size was normal. Wall thickness was   normal. Systolic function was normal. - Tricuspid valve: There was mild regurgitation. - Pulmonary arteries: Systolic pressure was at the upper limits of   normal. PA peak pressure: 37 mm Hg (S).     ASSESSMENT AND PLAN  This is a 66 y.o. male with non obstructive non ischemic cardiomyopathy, s/p SVT ablation, symptomatic atrial flutter.   Principal Problem:   Hypertensive emergency without congestive heart failure Active Problems:   GERD   CHEST PAIN   Demand ischemia (HCC)   Atrial fibrillation with RVR (HCC)   Chronic combined systolic and diastolic heart failure, NYHA class 2 (HCC)  Hypertensive emergency with elevated troponin, LV strain on ECG, chest pain - start cardizem SR 120 mg po BID - continue lisinopril, carvedilol, bidil  Chest pain, NSTEMI, troponin 0.3 -> 1.1 -> 1.6, We will hold Eliquis, start Heparin drip and plan for a cardiac cath for tomorrow could be secondary to microvascular disease, recent stress test did not demonstrate any infarction/ischemia, review of 2015 cath showed no significant disease, ECG did not demonstrate ST elevation in the setting of LBBB - aspirin, continue bidil, coreg, diltiazem for angina. Morphine prn  - add atorvastatin 40 mg po daily  Atrial fibrillation with RVR complicated by chest pain, CHADS2VASc score = 2 (age, htn) - unclear about compliance of the medications, Keep potassium >4, calcium >9, magnesium >2, checking TSH, amiodarone bolus given in the ED, discontinue diltiazem drip, -  start cardizem SR 120 mg po BID  Chronic systolic and diastolic heart failure, NYHA class II improved LVEF by last echocardiogram  - continue bidil, lisinopril, aspirin, control bp  GERD - continue protonix  Signed, Tobias Alexander MD, [facility] 09/22/2016

## 2016-09-22 NOTE — Progress Notes (Signed)
ANTICOAGULATION CONSULT NOTE - Initial Consult  Pharmacy Consult for Heparin Indication: chest pain/ACS and atrial fibrillation  No Known Allergies  Patient Measurements: Height: 6\' 1"  (185.4 cm) Weight: 190 lb (86.2 kg) IBW/kg (Calculated) : 79.9  Vital Signs: Temp: 98.8 F (37.1 C) (05/07 0720) Temp Source: Oral (05/07 0720) BP: 173/143 (05/07 0800) Pulse Rate: 96 (05/07 0800)  Labs:  Recent Labs  09/21/16 0237 09/21/16 0519 09/21/16 0959 09/21/16 1747  HGB 15.5 14.7  --   --   HCT 46.7 44.1  --   --   PLT 269 225  --   --   APTT 37*  --   --   --   LABPROT 14.9 15.9*  --   --   INR 1.17 1.26  --   --   CREATININE 1.04 0.97  --   --   TROPONINI 0.05* 0.35* 1.12* 1.69*    Estimated Creatinine Clearance: 85.8 mL/min (by C-G formula based on SCr of 0.97 mg/dL).   Medical History: Past Medical History:  Diagnosis Date  . Anemia    Microcytic   . Anxiety   . Asthma   . Atrial fibrillation (HCC)   . CHF (congestive heart failure) (HCC)   . Chronic upper back pain   . Coronary artery disease   . Depression   . Dysrhythmia   . Hypercholesteremia   . Hypertension   . Old myocardial infarct    /H&P 04/09/2012 (05/06/2012)  . SVT (supraventricular tachycardia) (HCC)    Assessment: 66yom on eliquis for afib, now to transition to heparin for NSTEMI with plan for cath tomorrow. Last eliquis dose 5/6 @ 2101. Will use aPTTs to monitor heparin as eliquis falsely elevates heparin levels. CBC, sCr wnl.  Goal of Therapy:  APTT 66-102 seconds Heparin level 0.3-0.7 units/ml Monitor platelets by anticoagulation protocol: Yes   Plan:  1) Begin heparin at 1200 units/hr 2) Check 6 hour aPTT and heparin level 3) Daily aPTT, heparin level, CBC  Fredrik Rigger 09/22/2016,9:56 AM

## 2016-09-23 ENCOUNTER — Encounter (HOSPITAL_COMMUNITY)
Admission: EM | Disposition: A | Discharge status: Home / Self Care | Payer: Self-pay | Source: Home / Self Care | Attending: Cardiology

## 2016-09-23 DIAGNOSIS — I5033 Acute on chronic diastolic (congestive) heart failure: Secondary | ICD-10-CM

## 2016-09-23 DIAGNOSIS — I5021 Acute systolic (congestive) heart failure: Secondary | ICD-10-CM

## 2016-09-23 HISTORY — PX: ULTRASOUND GUIDANCE FOR VASCULAR ACCESS: SHX6516

## 2016-09-23 HISTORY — PX: LEFT HEART CATH AND CORONARY ANGIOGRAPHY: CATH118249

## 2016-09-23 LAB — CBC
HEMATOCRIT: 41.4 % (ref 39.0–52.0)
Hemoglobin: 13.7 g/dL (ref 13.0–17.0)
MCH: 25.6 pg — ABNORMAL LOW (ref 26.0–34.0)
MCHC: 33.1 g/dL (ref 30.0–36.0)
MCV: 77.2 fL — ABNORMAL LOW (ref 78.0–100.0)
PLATELETS: 225 10*3/uL (ref 150–400)
RBC: 5.36 MIL/uL (ref 4.22–5.81)
RDW: 14.4 % (ref 11.5–15.5)
WBC: 8.6 10*3/uL (ref 4.0–10.5)

## 2016-09-23 LAB — HEPARIN LEVEL (UNFRACTIONATED): Heparin Unfractionated: 0.72 IU/mL — ABNORMAL HIGH (ref 0.30–0.70)

## 2016-09-23 LAB — BASIC METABOLIC PANEL
ANION GAP: 8 (ref 5–15)
BUN: 12 mg/dL (ref 6–20)
CHLORIDE: 105 mmol/L (ref 101–111)
CO2: 25 mmol/L (ref 22–32)
Calcium: 8.6 mg/dL — ABNORMAL LOW (ref 8.9–10.3)
Creatinine, Ser: 1 mg/dL (ref 0.61–1.24)
GFR calc non Af Amer: >60 mL/min (ref >60)
Glucose, Bld: 111 mg/dL — ABNORMAL HIGH (ref 65–99)
POTASSIUM: 3.7 mmol/L (ref 3.5–5.1)
SODIUM: 138 mmol/L (ref 135–145)

## 2016-09-23 LAB — GLUCOSE, CAPILLARY: GLUCOSE-CAPILLARY: 107 mg/dL — AB (ref 65–99)

## 2016-09-23 LAB — APTT: APTT: 85 s — AB (ref 24–36)

## 2016-09-23 LAB — MAGNESIUM: Magnesium: 2 mg/dL (ref 1.7–2.4)

## 2016-09-23 LAB — POCT ACTIVATED CLOTTING TIME: ACTIVATED CLOTTING TIME: 164 s

## 2016-09-23 SURGERY — LEFT HEART CATH AND CORONARY ANGIOGRAPHY
Anesthesia: LOCAL

## 2016-09-23 MED ORDER — VERAPAMIL HCL 2.5 MG/ML IV SOLN
INTRAVENOUS | Status: AC
  Filled 2016-09-23: qty 2

## 2016-09-23 MED ORDER — DILTIAZEM HCL ER COATED BEADS 360 MG PO CP24
360.0000 mg | ORAL_CAPSULE | Freq: Every day | ORAL | Status: DC
  Administered 2016-09-23 – 2016-09-25 (×3): 360 mg via ORAL
  Filled 2016-09-23 (×3): qty 1

## 2016-09-23 MED ORDER — ASPIRIN 81 MG PO CHEW
81.0000 mg | CHEWABLE_TABLET | ORAL | Status: DC
  Filled 2016-09-23: qty 1

## 2016-09-23 MED ORDER — ASPIRIN EC 81 MG PO TBEC
81.0000 mg | DELAYED_RELEASE_TABLET | Freq: Every day | ORAL | Status: DC
  Administered 2016-09-24 – 2016-09-25 (×2): 81 mg via ORAL
  Filled 2016-09-23 (×2): qty 1

## 2016-09-23 MED ORDER — SODIUM CHLORIDE 0.9 % IV SOLN: INTRAVENOUS | Status: DC

## 2016-09-23 MED ORDER — SODIUM CHLORIDE 0.9 % IV SOLN: 250.0000 mL | INTRAVENOUS | Status: DC | PRN

## 2016-09-23 MED ORDER — HEPARIN (PORCINE) IN NACL 2-0.9 UNIT/ML-% IJ SOLN
INTRAMUSCULAR | Status: AC
Start: 2016-09-23 — End: 2016-09-23
  Filled 2016-09-23: qty 1000

## 2016-09-23 MED ORDER — LIDOCAINE HCL 1 % IJ SOLN
INTRAMUSCULAR | Status: AC
  Filled 2016-09-23: qty 20

## 2016-09-23 MED ORDER — FENTANYL CITRATE (PF) 100 MCG/2ML IJ SOLN
INTRAMUSCULAR | Status: DC | PRN
  Administered 2016-09-23: 25 ug via INTRAVENOUS

## 2016-09-23 MED ORDER — SODIUM CHLORIDE 0.9% FLUSH
3.0000 mL | Freq: Two times a day (BID) | INTRAVENOUS | Status: DC
  Administered 2016-09-23: 3 mL via INTRAVENOUS

## 2016-09-23 MED ORDER — SODIUM CHLORIDE 0.9% FLUSH
3.0000 mL | Freq: Two times a day (BID) | INTRAVENOUS | Status: DC
  Administered 2016-09-24: 3 mL via INTRAVENOUS

## 2016-09-23 MED ORDER — IOPAMIDOL (ISOVUE-370) INJECTION 76%
INTRAVENOUS | Status: DC | PRN
  Administered 2016-09-23: 100 mL via INTRAVENOUS

## 2016-09-23 MED ORDER — B COMPLEX-C PO TABS
1.0000 | ORAL_TABLET | Freq: Every day | ORAL | Status: DC
  Administered 2016-09-23 – 2016-09-25 (×3): 1 via ORAL
  Filled 2016-09-23 (×3): qty 1

## 2016-09-23 MED ORDER — LIDOCAINE HCL (PF) 1 % IJ SOLN
INTRAMUSCULAR | Status: DC | PRN
  Administered 2016-09-23: 15 mL

## 2016-09-23 MED ORDER — HEPARIN (PORCINE) IN NACL 100-0.45 UNIT/ML-% IJ SOLN
1400.0000 [IU]/h | INTRAMUSCULAR | Status: AC
  Administered 2016-09-23 – 2016-09-24 (×2): 1400 [IU]/h via INTRAVENOUS
  Filled 2016-09-23 (×3): qty 250

## 2016-09-23 MED ORDER — FAMOTIDINE 20 MG PO TABS: 20.0000 mg | ORAL_TABLET | Freq: Every day | ORAL | Status: DC

## 2016-09-23 MED ORDER — ASPIRIN 81 MG PO CHEW
81.0000 mg | CHEWABLE_TABLET | ORAL | Status: AC
  Administered 2016-09-23: 81 mg via ORAL

## 2016-09-23 MED ORDER — SODIUM CHLORIDE 0.9 % IV SOLN
INTRAVENOUS | Status: AC
  Administered 2016-09-23: 15:00:00 via INTRAVENOUS

## 2016-09-23 MED ORDER — HEPARIN (PORCINE) IN NACL 2-0.9 UNIT/ML-% IJ SOLN
INTRAMUSCULAR | Status: DC | PRN
  Administered 2016-09-23: 1000 mL

## 2016-09-23 MED ORDER — MIDAZOLAM HCL 2 MG/2ML IJ SOLN
INTRAMUSCULAR | Status: AC
  Filled 2016-09-23: qty 2

## 2016-09-23 MED ORDER — ONDANSETRON HCL 4 MG/2ML IJ SOLN: 4.0000 mg | Freq: Four times a day (QID) | INTRAMUSCULAR | Status: DC | PRN

## 2016-09-23 MED ORDER — SODIUM CHLORIDE 0.9% FLUSH: 3.0000 mL | INTRAVENOUS | Status: DC | PRN

## 2016-09-23 MED ORDER — FENTANYL CITRATE (PF) 100 MCG/2ML IJ SOLN
INTRAMUSCULAR | Status: AC
  Filled 2016-09-23: qty 2

## 2016-09-23 MED ORDER — FUROSEMIDE 40 MG PO TABS
40.0000 mg | ORAL_TABLET | Freq: Two times a day (BID) | ORAL | Status: DC
  Administered 2016-09-23 – 2016-09-25 (×4): 40 mg via ORAL
  Filled 2016-09-23 (×5): qty 1

## 2016-09-23 MED ORDER — IOPAMIDOL (ISOVUE-370) INJECTION 76%
INTRAVENOUS | Status: AC
  Filled 2016-09-23: qty 100

## 2016-09-23 MED ORDER — MIDAZOLAM HCL 2 MG/2ML IJ SOLN
INTRAMUSCULAR | Status: DC | PRN
  Administered 2016-09-23: 2 mg via INTRAVENOUS

## 2016-09-23 MED ORDER — B COMPLEX PO TABS: 1.0000 | ORAL_TABLET | Freq: Every day | ORAL | Status: DC

## 2016-09-23 MED ORDER — HEPARIN SODIUM (PORCINE) 1000 UNIT/ML IJ SOLN
INTRAMUSCULAR | Status: AC
  Filled 2016-09-23: qty 1

## 2016-09-23 MED ORDER — ACETAMINOPHEN 325 MG PO TABS
650.0000 mg | ORAL_TABLET | ORAL | Status: DC | PRN
Start: 2016-09-23 — End: 2016-09-25
  Administered 2016-09-24 (×2): 650 mg via ORAL
  Filled 2016-09-23 (×2): qty 2

## 2016-09-23 MED ORDER — LATANOPROST 0.005 % OP SOLN
1.0000 [drp] | Freq: Every day | OPHTHALMIC | Status: DC
  Administered 2016-09-23 – 2016-09-24 (×2): 1 [drp] via OPHTHALMIC
  Filled 2016-09-23: qty 2.5

## 2016-09-23 SURGICAL SUPPLY — 10 items
CATH INFINITI 5FR MULTPACK ANG (CATHETERS) ×3 IMPLANT
COVER PRB 48X5XTLSCP FOLD TPE (BAG) ×2 IMPLANT
COVER PROBE 5X48 (BAG) ×1
GUIDEWIRE 3MM J TIP .035 145 (WIRE) ×3 IMPLANT
KIT HEART LEFT (KITS) ×3 IMPLANT
PACK CARDIAC CATHETERIZATION (CUSTOM PROCEDURE TRAY) ×3 IMPLANT
SHEATH PINNACLE 5F 10CM (SHEATH) ×3 IMPLANT
SYR MEDRAD MARK V 150ML (SYRINGE) ×3 IMPLANT
TRANSDUCER W/STOPCOCK (MISCELLANEOUS) ×3 IMPLANT
TUBING CIL FLEX 10 FLL-RA (TUBING) ×3 IMPLANT

## 2016-09-23 NOTE — Progress Notes (Signed)
Rt femoral sheath removed by Harriette Ohara RT for 20 minutes. Instructed pt and tegaderm dressing.

## 2016-09-23 NOTE — Progress Notes (Addendum)
Patient Name: Cody Brewer Date of Encounter: 09/23/2016  Principal Problem:   Hypertensive emergency without congestive heart failure Active Problems:   GERD   CHEST PAIN   Demand ischemia (HCC)   Atrial fibrillation with RVR (HCC)   Chronic combined systolic and diastolic heart failure, NYHA class 2 (HCC)   Length of Stay: 2  SUBJECTIVE  The patient feels weak, no chest pain, mild SOB.   CURRENT MEDS . [START ON 09/24/2016] aspirin  81 mg Oral Pre-Cath  . [START ON 09/24/2016] aspirin EC  81 mg Oral Daily  . atorvastatin  40 mg Oral q1800  . carvedilol  25 mg Oral BID WC  . diltiazem  120 mg Oral Q12H  . famotidine  20 mg Oral BID  . furosemide  40 mg Intravenous BID  . hydrALAZINE  37.5 mg Oral Q8H  . isosorbide dinitrate  20 mg Oral BID  . lisinopril  10 mg Oral Daily  . pantoprazole  40 mg Oral BID  . sodium chloride flush  3 mL Intravenous Q12H  . sodium chloride flush  3 mL Intravenous Q12H  . sucralfate  1 g Oral QID  . tamsulosin  0.4 mg Oral QPC supper   OBJECTIVE  Vitals:   09/22/16 2338 09/23/16 0406 09/23/16 0550 09/23/16 0804  BP: (!) 135/92 (!) 152/90 (!) 150/111 (!) 146/96  Pulse: 91 96 94 96  Resp: (!) 22 (!) 21 18 20   Temp: 98.5 F (36.9 C) 98.7 F (37.1 C)  98.7 F (37.1 C)  TempSrc: Oral Oral  Oral  SpO2:  90% 90% 98%  Weight:  198 lb 3.2 oz (89.9 kg)    Height:        Intake/Output Summary (Last 24 hours) at 09/23/16 0842 Last data filed at 09/23/16 0804  Gross per 24 hour  Intake            208.8 ml  Output             1225 ml  Net          -1016.2 ml   Filed Weights   09/21/16 0433 09/22/16 0335 09/23/16 0406  Weight: 188 lb 6.4 oz (85.5 kg) 190 lb (86.2 kg) 198 lb 3.2 oz (89.9 kg)   PHYSICAL EXAM  General: Pleasant, NAD. Neuro: Alert and oriented X 3. Moves all extremities spontaneously. Psych: Normal affect. HEENT:  Normal  Neck: Supple without bruits or JVD. Lungs:  Resp regular and unlabored, crackles at bases.    Heart: RRR no s3, s4, or murmurs. Abdomen: Soft, non-tender, non-distended, BS + x 4.  Extremities: No clubbing, cyanosis, mild B/L LE edema. DP/PT/Radials 2+ and equal bilaterally.  Accessory Clinical Findings  CBC  Recent Labs  09/21/16 0237 09/21/16 0519 09/23/16 0254  WBC 12.6* 10.7* 8.6  NEUTROABS 8.6*  --   --   HGB 15.5 14.7 13.7  HCT 46.7 44.1 41.4  MCV 78.0 77.8* 77.2*  PLT 269 225 225   Basic Metabolic Panel  Recent Labs  09/21/16 0519 09/23/16 0254  NA 138 138  K 3.7 3.7  CL 104 105  CO2 23 25  GLUCOSE 143* 111*  BUN 16 12  CREATININE 0.97 1.00  CALCIUM 8.9 8.6*  MG  --  2.0   Liver Function Tests  Recent Labs  09/21/16 0519  AST 34  ALT 32  ALKPHOS 69  BILITOT 1.3*  PROT 6.9  ALBUMIN 3.6    Recent Labs  09/21/16 0519 09/21/16  0959 09/21/16 1747  TROPONINI 0.35* 1.12* 1.69*    Recent Labs  09/21/16 0519  HGBA1C 5.8*   Fasting Lipid Panel  Recent Labs  09/21/16 0519  CHOL 184  HDL 57  LDLCALC 111*  TRIG 80  CHOLHDL 3.2   Thyroid Function Tests  Recent Labs  09/21/16 0519  TSH 1.715   Radiology/Studies  Dg Chest Port 1 View  Result Date: 09/21/2016 CLINICAL DATA:  Chest discomfort for several days. EXAM: PORTABLE CHEST 1 VIEW COMPARISON:  03/14/2016 FINDINGS: A single AP portable view of the chest demonstrates no focal airspace consolidation or alveolar edema. The lungs are grossly clear. There is no large effusion or pneumothorax. Cardiac and mediastinal contours appear unremarkable. IMPRESSION: No active disease. Electronically Signed   By: Ellery Plunk M.D.   On: 09/21/2016 02:50   TTE: 04/2016 - Left ventricle: The cavity size was mildly dilated. There was   mild concentric hypertrophy. Systolic function was normal. The   estimated ejection fraction was in the range of 55% to 60%. Wall   motion was normal; there were no regional wall motion   abnormalities. Doppler parameters are consistent with abnormal    left ventricular relaxation (grade 1 diastolic dysfunction).   Doppler parameters are consistent with high ventricular filling   pressure. - Aortic valve: Transvalvular velocity was within the normal range.   There was no stenosis. There was no regurgitation. - Mitral valve: Transvalvular velocity was within the normal range.   There was no evidence for stenosis. There was mild regurgitation   in early systole. - Left atrium: The atrium was moderately dilated. - Right ventricle: The cavity size was normal. Wall thickness was   normal. Systolic function was normal. - Tricuspid valve: There was mild regurgitation. - Pulmonary arteries: Systolic pressure was at the upper limits of   normal. PA peak pressure: 37 mm Hg (S).  Telemetry: personally reviewed. A-fib with HR in 90', up to 140 with motion   ASSESSMENT AND PLAN  This is a 66 y.o. male with non obstructive non ischemic cardiomyopathy, s/p SVT ablation, symptomatic atrial flutter.   Principal Problem:   Hypertensive emergency without congestive heart failure Active Problems:   GERD   CHEST PAIN   Demand ischemia (HCC)   Atrial fibrillation with RVR (HCC)   Chronic combined systolic and diastolic heart failure, NYHA class 2 (HCC)  Hypertensive emergency with elevated troponin, LV strain on ECG, chest pain - switch cardizem to CD 360 mg po QD - continue lisinopril, carvedilol, bidil  Chest pain, NSTEMI, troponin 0.3 -> 1.1 -> 1.6, We held Eliquis, started Heparin drip and plan for a cardiac cath today could be secondary to microvascular disease, recent stress test did not demonstrate any infarction/ischemia, review of 2015 cath showed no significant disease, ECG did not demonstrate ST elevation in the setting of LBBB - aspirin, continue bidil, coreg, diltiazem for angina. Morphine prn  - added atorvastatin 40 mg po daily  Atrial fibrillation with RVR complicated by chest pain, CHADS2VASc score = 2 (age, htn) - this new,  if he has no CAD, we will plan for a DCCV tomorrow - unclear about compliance of the medications, Keep potassium >4, calcium >9, magnesium >2, checking TSH, amiodarone bolus given in the ED, HR in 90 but up to 140' with motion, will increase cardizem to 360 mg po daily.  Acute on chronic systolic and diastolic heart failure, NYHA class II improved LVEF by last echocardiogram  - continue bidil, lisinopril,  aspirin, control bp - diuresed well with iv Lasix, we will switch to lasix 40 mg PO BID  GERD - continue protonix  Signed, Tobias Alexander MD, [facility] 09/23/2016

## 2016-09-23 NOTE — H&P (View-Only) (Signed)
Patient Name: Cody Brewer Date of Encounter: 09/23/2016  Principal Problem:   Hypertensive emergency without congestive heart failure Active Problems:   GERD   CHEST PAIN   Demand ischemia (HCC)   Atrial fibrillation with RVR (HCC)   Chronic combined systolic and diastolic heart failure, NYHA class 2 (HCC)   Length of Stay: 2  SUBJECTIVE  The patient feels weak, no chest pain, mild SOB.   CURRENT MEDS . [START ON 09/24/2016] aspirin  81 mg Oral Pre-Cath  . [START ON 09/24/2016] aspirin EC  81 mg Oral Daily  . atorvastatin  40 mg Oral q1800  . carvedilol  25 mg Oral BID WC  . diltiazem  120 mg Oral Q12H  . famotidine  20 mg Oral BID  . furosemide  40 mg Intravenous BID  . hydrALAZINE  37.5 mg Oral Q8H  . isosorbide dinitrate  20 mg Oral BID  . lisinopril  10 mg Oral Daily  . pantoprazole  40 mg Oral BID  . sodium chloride flush  3 mL Intravenous Q12H  . sodium chloride flush  3 mL Intravenous Q12H  . sucralfate  1 g Oral QID  . tamsulosin  0.4 mg Oral QPC supper   OBJECTIVE  Vitals:   09/22/16 2338 09/23/16 0406 09/23/16 0550 09/23/16 0804  BP: (!) 135/92 (!) 152/90 (!) 150/111 (!) 146/96  Pulse: 91 96 94 96  Resp: (!) 22 (!) 21 18 20   Temp: 98.5 F (36.9 C) 98.7 F (37.1 C)  98.7 F (37.1 C)  TempSrc: Oral Oral  Oral  SpO2:  90% 90% 98%  Weight:  198 lb 3.2 oz (89.9 kg)    Height:        Intake/Output Summary (Last 24 hours) at 09/23/16 0842 Last data filed at 09/23/16 0804  Gross per 24 hour  Intake            208.8 ml  Output             1225 ml  Net          -1016.2 ml   Filed Weights   09/21/16 0433 09/22/16 0335 09/23/16 0406  Weight: 188 lb 6.4 oz (85.5 kg) 190 lb (86.2 kg) 198 lb 3.2 oz (89.9 kg)   PHYSICAL EXAM  General: Pleasant, NAD. Neuro: Alert and oriented X 3. Moves all extremities spontaneously. Psych: Normal affect. HEENT:  Normal  Neck: Supple without bruits or JVD. Lungs:  Resp regular and unlabored, crackles at bases.    Heart: RRR no s3, s4, or murmurs. Abdomen: Soft, non-tender, non-distended, BS + x 4.  Extremities: No clubbing, cyanosis, mild B/L LE edema. DP/PT/Radials 2+ and equal bilaterally.  Accessory Clinical Findings  CBC  Recent Labs  09/21/16 0237 09/21/16 0519 09/23/16 0254  WBC 12.6* 10.7* 8.6  NEUTROABS 8.6*  --   --   HGB 15.5 14.7 13.7  HCT 46.7 44.1 41.4  MCV 78.0 77.8* 77.2*  PLT 269 225 225   Basic Metabolic Panel  Recent Labs  09/21/16 0519 09/23/16 0254  NA 138 138  K 3.7 3.7  CL 104 105  CO2 23 25  GLUCOSE 143* 111*  BUN 16 12  CREATININE 0.97 1.00  CALCIUM 8.9 8.6*  MG  --  2.0   Liver Function Tests  Recent Labs  09/21/16 0519  AST 34  ALT 32  ALKPHOS 69  BILITOT 1.3*  PROT 6.9  ALBUMIN 3.6    Recent Labs  09/21/16 0519 09/21/16  0959 09/21/16 1747  TROPONINI 0.35* 1.12* 1.69*    Recent Labs  09/21/16 0519  HGBA1C 5.8*   Fasting Lipid Panel  Recent Labs  09/21/16 0519  CHOL 184  HDL 57  LDLCALC 111*  TRIG 80  CHOLHDL 3.2   Thyroid Function Tests  Recent Labs  09/21/16 0519  TSH 1.715   Radiology/Studies  Dg Chest Port 1 View  Result Date: 09/21/2016 CLINICAL DATA:  Chest discomfort for several days. EXAM: PORTABLE CHEST 1 VIEW COMPARISON:  03/14/2016 FINDINGS: A single AP portable view of the chest demonstrates no focal airspace consolidation or alveolar edema. The lungs are grossly clear. There is no large effusion or pneumothorax. Cardiac and mediastinal contours appear unremarkable. IMPRESSION: No active disease. Electronically Signed   By: Ellery Plunk M.D.   On: 09/21/2016 02:50   TTE: 04/2016 - Left ventricle: The cavity size was mildly dilated. There was   mild concentric hypertrophy. Systolic function was normal. The   estimated ejection fraction was in the range of 55% to 60%. Wall   motion was normal; there were no regional wall motion   abnormalities. Doppler parameters are consistent with abnormal    left ventricular relaxation (grade 1 diastolic dysfunction).   Doppler parameters are consistent with high ventricular filling   pressure. - Aortic valve: Transvalvular velocity was within the normal range.   There was no stenosis. There was no regurgitation. - Mitral valve: Transvalvular velocity was within the normal range.   There was no evidence for stenosis. There was mild regurgitation   in early systole. - Left atrium: The atrium was moderately dilated. - Right ventricle: The cavity size was normal. Wall thickness was   normal. Systolic function was normal. - Tricuspid valve: There was mild regurgitation. - Pulmonary arteries: Systolic pressure was at the upper limits of   normal. PA peak pressure: 37 mm Hg (S).  Telemetry: personally reviewed. A-fib with HR in 90', up to 140 with motion   ASSESSMENT AND PLAN  This is a 66 y.o. male with non obstructive non ischemic cardiomyopathy, s/p SVT ablation, symptomatic atrial flutter.   Principal Problem:   Hypertensive emergency without congestive heart failure Active Problems:   GERD   CHEST PAIN   Demand ischemia (HCC)   Atrial fibrillation with RVR (HCC)   Chronic combined systolic and diastolic heart failure, NYHA class 2 (HCC)  Hypertensive emergency with elevated troponin, LV strain on ECG, chest pain - switch cardizem to CD 360 mg po QD - continue lisinopril, carvedilol, bidil  Chest pain, NSTEMI, troponin 0.3 -> 1.1 -> 1.6, We held Eliquis, started Heparin drip and plan for a cardiac cath today could be secondary to microvascular disease, recent stress test did not demonstrate any infarction/ischemia, review of 2015 cath showed no significant disease, ECG did not demonstrate ST elevation in the setting of LBBB - aspirin, continue bidil, coreg, diltiazem for angina. Morphine prn  - added atorvastatin 40 mg po daily  Atrial fibrillation with RVR complicated by chest pain, CHADS2VASc score = 2 (age, htn) - this new,  if he has no CAD, we will plan for a DCCV tomorrow - unclear about compliance of the medications, Keep potassium >4, calcium >9, magnesium >2, checking TSH, amiodarone bolus given in the ED, HR in 90 but up to 140' with motion, will increase cardizem to 360 mg po daily.  Acute on chronic systolic and diastolic heart failure, NYHA class II improved LVEF by last echocardiogram  - continue bidil, lisinopril,  aspirin, control bp - diuresed well with iv Lasix, we will switch to lasix 40 mg PO BID  GERD - continue protonix  Signed, Tobias Alexander MD, [facility] 09/23/2016

## 2016-09-23 NOTE — Care Management Note (Addendum)
Case Management Note  Patient Details  Name: Cody Brewer MRN: [id number] Date of Birth: 03-26-1951  Subjective/Objective:   afib with RVR, CHF                 Action/Plan: Discharge Planning: NCM spoke to p and wife at bedside. Pt states he is on a fixed income and Bidil is expensive. Provided pt with [employer] and [employer] for [employer]. Wife will call to see if he qualifies. On Eliquis prior to admission. Pt was independent prior to hospital stay. Takes meds as prescribed and weighs daily. Will continue to follow for dc needs.   PCP ROSS, Darlen Round MD   Expected Discharge Date:  09/24/16               Expected Discharge Plan:  Home/Self Care  In-House Referral:  NA  Discharge planning Services  CM Consult  Post Acute Care Choice:  NA Choice offered to:  NA  DME Arranged:  N/A DME Agency:  NA  HH Arranged:  NA HH Agency:  NA  Status of Service:  Completed, signed off  If discussed at Long Length of Stay Meetings, dates discussed:    Additional Comments:  Elliot Cousin, RN 09/23/2016, 2:54 PM

## 2016-09-23 NOTE — Interval H&P Note (Signed)
Cath Lab Visit (complete for each Cath Lab visit)  Clinical Evaluation Leading to the Procedure:   ACS: Yes.    Non-ACS:    Anginal Classification: CCS IV  Anti-ischemic medical therapy: Minimal Therapy (1 class of medications)  Non-Invasive Test Results: No non-invasive testing performed  Prior CABG: No previous CABG      History and Physical Interval Note:  09/23/2016 12:56 PM  Cody Brewer  has presented today for surgery, with the diagnosis of cp  The various methods of treatment have been discussed with the patient and family. After consideration of risks, benefits and other options for treatment, the patient has consented to  Procedure(s): Left Heart Cath and Coronary Angiography (N/A) as a surgical intervention .  The patient's history has been reviewed, patient examined, no change in status, stable for surgery.  I have reviewed the patient's chart and labs.  Questions were answered to the patient's satisfaction.     Lance Muss

## 2016-09-23 NOTE — Progress Notes (Signed)
ANTICOAGULATION CONSULT NOTE - Follow Up Consult  Pharmacy Consult for Heparin Indication:  atrial fibrillation  No Known Allergies  Patient Measurements: Height: 6\' 1"  (185.4 cm) Weight: 198 lb 3.2 oz (89.9 kg) IBW/kg (Calculated) : 79.9  Vital Signs: Temp: 97.9 F (36.6 C) (05/08 1204) Temp Source: Oral (05/08 1204) BP: 150/105 (05/08 1350) Pulse Rate: 82 (05/08 1350)  Labs:  Recent Labs  09/21/16 0237 09/21/16 0519 09/21/16 0959 09/21/16 1747 09/22/16 1830 09/23/16 0254  HGB 15.5 14.7  --   --   --  13.7  HCT 46.7 44.1  --   --   --  41.4  PLT 269 225  --   --   --  225  APTT 37*  --   --   --  51* 85*  LABPROT 14.9 15.9*  --   --   --   --   INR 1.17 1.26  --   --   --   --   HEPARINUNFRC  --   --   --   --  0.83* 0.72*  CREATININE 1.04 0.97  --   --   --  1.00  TROPONINI 0.05* 0.35* 1.12* 1.69*  --   --     Estimated Creatinine Clearance: 83.2 mL/min (by C-G formula based on SCr of 1 mg/dL).  Assessment: 66yom on eliquis pta for afib (last dose 5/6 @ 2101), transitioned to heparin yesterday for NSTEMI. He is s/p cath today found to have nonobstructive CAD. Heparin will be resumed 8 hours post-sheath removal for afib. Sheath pulled ~ 1400. Previously therapeutic on 1400 units/hr.   Goal of Therapy:  Heparin level 0.3-0.7 units/ml Monitor platelets by anticoagulation protocol: Yes   Plan:  1) At 2200 tonight, resume heparin at 1400 units/hr 2) Follow up daily heparin level and CBC  Fredrik Rigger 09/23/2016,2:34 PM

## 2016-09-23 NOTE — Progress Notes (Signed)
Returned to unit from Cath lab; telephone report received prior to return.  Right femoral site level 0; tegaderm to site is C/D/I & area is soft.  Reports improvement of mid chest discomfort; rates 3/10.  Denies need for any intervention at this time.  Orders reviewed.  Pt educated on post cath instructions including bed rest x4 hrs (started @ 1415), & advance diet as tolerated.  Wife paged & returned to room.  Remains A-fib on monitor with HR 70's.  Will continue to monitor.

## 2016-09-23 NOTE — Progress Notes (Signed)
ANTICOAGULATION CONSULT NOTE  Pharmacy Consult for Heparin Indication: chest pain/ACS and atrial fibrillation  No Known Allergies  Patient Measurements: Height: 6\' 1"  (185.4 cm) Weight: 190 lb (86.2 kg) IBW/kg (Calculated) : 79.9  Vital Signs: Temp: 98.5 F (36.9 C) (05/07 2338) Temp Source: Oral (05/07 2338) BP: 135/92 (05/07 2338) Pulse Rate: 91 (05/07 2338)  Labs:  Recent Labs  09/21/16 0237 09/21/16 0519 09/21/16 0959 09/21/16 1747 09/22/16 1830 09/23/16 0254  HGB 15.5 14.7  --   --   --  13.7  HCT 46.7 44.1  --   --   --  41.4  PLT 269 225  --   --   --  225  APTT 37*  --   --   --  51* 85*  LABPROT 14.9 15.9*  --   --   --   --   INR 1.17 1.26  --   --   --   --   HEPARINUNFRC  --   --   --   --  0.83* 0.72*  CREATININE 1.04 0.97  --   --   --   --   TROPONINI 0.05* 0.35* 1.12* 1.69*  --   --     Estimated Creatinine Clearance: 85.8 mL/min (by C-G formula based on SCr of 0.97 mg/dL).  Assessment: 66yom on eliquis for afib, now to transition to heparin for NSTEMI with plan for cath today. Last eliquis dose 5/6 @ 2101. Will use aPTTs to monitor heparin as eliquis falsely elevates heparin levels.   APTT therapeutic (85 sec) on gtt at 1400 units/hr. Heparin level remains elevated due to Eliquis. Hgb down a bit, no bleeding noted.  Goal of Therapy:  APTT 66-102 seconds Heparin level 0.3-0.7 units/ml Monitor platelets by anticoagulation protocol: Yes   Plan:  Continue IV heparin 1400 units/hr Will f/u post cath  Christoper Fabian, PharmD, BCPS Clinical pharmacist, pager [phone]  09/23/2016 3:37 AM

## 2016-09-24 ENCOUNTER — Inpatient Hospital Stay (HOSPITAL_COMMUNITY): Payer: Medicare Other | Admitting: Anesthesiology

## 2016-09-24 ENCOUNTER — Inpatient Hospital Stay (HOSPITAL_COMMUNITY): Payer: Medicare Other

## 2016-09-24 ENCOUNTER — Encounter (HOSPITAL_COMMUNITY)
Admission: EM | Disposition: A | Discharge status: Home / Self Care | Payer: Self-pay | Source: Home / Self Care | Attending: Cardiology

## 2016-09-24 ENCOUNTER — Encounter (HOSPITAL_COMMUNITY): Payer: Self-pay | Admitting: Interventional Cardiology

## 2016-09-24 DIAGNOSIS — I351 Nonrheumatic aortic (valve) insufficiency: Secondary | ICD-10-CM

## 2016-09-24 DIAGNOSIS — Z9889 Other specified postprocedural states: Secondary | ICD-10-CM

## 2016-09-24 DIAGNOSIS — I214 Non-ST elevation (NSTEMI) myocardial infarction: Secondary | ICD-10-CM

## 2016-09-24 DIAGNOSIS — I428 Other cardiomyopathies: Secondary | ICD-10-CM

## 2016-09-24 DIAGNOSIS — I4891 Unspecified atrial fibrillation: Secondary | ICD-10-CM

## 2016-09-24 DIAGNOSIS — I248 Other forms of acute ischemic heart disease: Secondary | ICD-10-CM

## 2016-09-24 HISTORY — PX: TEE WITHOUT CARDIOVERSION: SHX5443

## 2016-09-24 HISTORY — DX: Other cardiomyopathies: I42.8

## 2016-09-24 HISTORY — PX: CARDIOVERSION: SHX1299

## 2016-09-24 LAB — CBC
HCT: 41.2 % (ref 39.0–52.0)
HEMOGLOBIN: 13.5 g/dL (ref 13.0–17.0)
MCH: 25.3 pg — AB (ref 26.0–34.0)
MCHC: 32.8 g/dL (ref 30.0–36.0)
MCV: 77.3 fL — ABNORMAL LOW (ref 78.0–100.0)
PLATELETS: 253 10*3/uL (ref 150–400)
RBC: 5.33 MIL/uL (ref 4.22–5.81)
RDW: 14.6 % (ref 11.5–15.5)
WBC: 8.2 10*3/uL (ref 4.0–10.5)

## 2016-09-24 LAB — GLUCOSE, CAPILLARY: GLUCOSE-CAPILLARY: 117 mg/dL — AB (ref 65–99)

## 2016-09-24 LAB — HEPARIN LEVEL (UNFRACTIONATED): HEPARIN UNFRACTIONATED: 0.39 [IU]/mL (ref 0.30–0.70)

## 2016-09-24 SURGERY — CARDIOVERSION
Anesthesia: Monitor Anesthesia Care

## 2016-09-24 MED ORDER — PANTOPRAZOLE SODIUM 40 MG PO TBEC: 40.0000 mg | DELAYED_RELEASE_TABLET | Freq: Two times a day (BID) | ORAL | 3 refills | Status: DC

## 2016-09-24 MED ORDER — ACETAMINOPHEN 325 MG PO TABS: 650.0000 mg | ORAL_TABLET | ORAL | Status: AC | PRN

## 2016-09-24 MED ORDER — APIXABAN 5 MG PO TABS
5.0000 mg | ORAL_TABLET | Freq: Two times a day (BID) | ORAL | Status: DC
  Administered 2016-09-24 – 2016-09-25 (×2): 5 mg via ORAL
  Filled 2016-09-24 (×2): qty 1

## 2016-09-24 MED ORDER — PROPOFOL 500 MG/50ML IV EMUL
INTRAVENOUS | Status: DC | PRN
  Administered 2016-09-24: 75 ug/kg/min via INTRAVENOUS

## 2016-09-24 MED ORDER — PROPOFOL 10 MG/ML IV BOLUS
INTRAVENOUS | Status: DC | PRN
  Administered 2016-09-24 (×2): 20 mg via INTRAVENOUS
  Administered 2016-09-24: 50 mg via INTRAVENOUS

## 2016-09-24 MED ORDER — APIXABAN 5 MG PO TABS: 5.0000 mg | ORAL_TABLET | Freq: Two times a day (BID) | ORAL | 11 refills | Status: DC

## 2016-09-24 MED ORDER — APIXABAN 5 MG PO TABS: 5.0000 mg | ORAL_TABLET | Freq: Two times a day (BID) | ORAL | 6 refills | Status: DC

## 2016-09-24 MED ORDER — LISINOPRIL 10 MG PO TABS: 10.0000 mg | ORAL_TABLET | Freq: Every day | ORAL | Status: DC

## 2016-09-24 MED ORDER — HYDRALAZINE HCL 25 MG PO TABS: 37.5000 mg | ORAL_TABLET | Freq: Two times a day (BID) | ORAL | 6 refills | Status: DC

## 2016-09-24 MED ORDER — ATORVASTATIN CALCIUM 40 MG PO TABS: 40.0000 mg | ORAL_TABLET | Freq: Every day | ORAL | 3 refills | Status: DC

## 2016-09-24 MED ORDER — ASPIRIN 81 MG PO TBEC: 81.0000 mg | DELAYED_RELEASE_TABLET | Freq: Every day | ORAL | Status: DC

## 2016-09-24 MED ORDER — BUTAMBEN-TETRACAINE-BENZOCAINE 2-2-14 % EX AERO
INHALATION_SPRAY | CUTANEOUS | Status: DC | PRN
  Administered 2016-09-24: 2 via TOPICAL

## 2016-09-24 MED ORDER — ISOSORBIDE DINITRATE 20 MG PO TABS: 20.0000 mg | ORAL_TABLET | Freq: Two times a day (BID) | ORAL | 6 refills | Status: DC

## 2016-09-24 MED ORDER — DILTIAZEM HCL ER COATED BEADS 360 MG PO CP24: 360.0000 mg | ORAL_CAPSULE | Freq: Every day | ORAL | 5 refills | Status: DC

## 2016-09-24 MED ORDER — TAMSULOSIN HCL 0.4 MG PO CAPS
0.4000 mg | ORAL_CAPSULE | Freq: Every day | ORAL | Status: AC
Start: 2016-09-24 — End: ?

## 2016-09-24 MED FILL — Verapamil HCl IV Soln 2.5 MG/ML: INTRAVENOUS | Qty: 2 | Status: AC

## 2016-09-24 MED FILL — Heparin Sodium (Porcine) Inj 1000 Unit/ML: INTRAMUSCULAR | Qty: 10 | Status: AC

## 2016-09-24 NOTE — Interval H&P Note (Signed)
History and Physical Interval Note:  09/24/2016 9:02 AM  Cody Brewer  has presented today for surgery, with the diagnosis of atrial flutter The various methods of treatment have been discussed with the patient and family. After consideration of risks, benefits and other options for treatment, the patient has consented to  Procedure(s): CARDIOVERSION (N/A) TRANSESOPHAGEAL ECHOCARDIOGRAM (TEE) (N/A) as a surgical intervention .  The patient's history has been reviewed, patient examined, no change in status, stable for surgery.  I have reviewed the patient's chart and labs.  Questions were answered to the patient's satisfaction.     [name], MD

## 2016-09-24 NOTE — CV Procedure (Signed)
Brief TEE Note  LVEF 30-35% Global hypokinesis Mild MR, TR, AR No LA/LAA thrombus or mass No ASD or PFO by color flow Doppler.  For additional details see full report.  Electrical Cardioversion Procedure Note Cody Brewer [id number] 02/03/1951  Procedure: Electrical Cardioversion Indications:  Atrial Flutter  Procedure Details Consent: Risks of procedure as well as the alternatives and risks of each were explained to the (patient/caregiver).  Consent for procedure obtained. Time Out: Verified patient identification, verified procedure, site/side was marked, verified correct patient position, special equipment/implants available, medications/allergies/relevent history reviewed, required imaging and test results available.  Performed  Patient placed on cardiac monitor, pulse oximetry, supplemental oxygen as necessary.  Sedation given: Propfolol Pacer pads placed anterior and posterior chest.  Cardioverted 1 time(s).  Cardioverted at 120J.  Evaluation Findings: Post procedure EKG shows: NSR Complications: None Patient did tolerate procedure well.   [name], MD 09/24/2016, 10:32 AM

## 2016-09-24 NOTE — Anesthesia Preprocedure Evaluation (Addendum)
Anesthesia Evaluation  Patient identified by MRN, date of birth, ID band Patient awake    Reviewed: Allergy & Precautions, H&P , NPO status , Patient's Chart, lab work & pertinent test results, reviewed documented beta blocker date and time   History of Anesthesia Complications Negative for: history of anesthetic complications  Airway Mallampati: I  TM Distance: >3 FB Neck ROM: Full    Dental  (+) Edentulous Upper, Edentulous Lower, Dental Advisory Given   Pulmonary asthma , former smoker,    Pulmonary exam normal        Cardiovascular hypertension, Pt. on medications and Pt. on home beta blockers + CAD ('13 cath: non-obstructive coronary disease) and + Past MI  + dysrhythmias Atrial Fibrillation and Supra Ventricular Tachycardia  Rhythm:Irregular Rate:Tachycardia  09/23/2016 Cath Conclusion    Mid LAD lesion, 20 %stenosed.  Dist LAD lesion, 20 %stenosed.  The left ventricular ejection fraction is 35-45% by visual estimate.  There is mild to moderate left ventricular systolic dysfunction, with severe basal hypokinesis. Unchanged from prior cath.  LV end diastolic pressure is normal.  There is no aortic valve stenosis.   Nonobstructive CAD.  Continue aggressive secondary prevention and management of atrial fibrillation.    Study Conclusions  - Left ventricle: The cavity size was mildly dilated. There was   mild concentric hypertrophy. Systolic function was normal. The   estimated ejection fraction was in the range of 55% to 60%. Wall   motion was normal; there were no regional wall motion   abnormalities.   Neuro/Psych PSYCHIATRIC DISORDERS Anxiety Depression negative neurological ROS     GI/Hepatic Neg liver ROS, GERD  Medicated and Controlled,  Endo/Other  negative endocrine ROS  Renal/GU negative Renal ROS     Musculoskeletal   Abdominal   Peds  Hematology negative hematology ROS (+)   Anesthesia  Other Findings   Reproductive/Obstetrics                           Anesthesia Physical  Anesthesia Plan  ASA: III  Anesthesia Plan: MAC   Post-op Pain Management:    Induction: Intravenous  Airway Management Planned: Natural Airway and Nasal Cannula  Additional Equipment:   Intra-op Plan:   Post-operative Plan:   Informed Consent: I have reviewed the patients History and Physical, chart, labs and discussed the procedure including the risks, benefits and alternatives for the proposed anesthesia with the patient or authorized representative who has indicated his/her understanding and acceptance.     Plan Discussed with: CRNA and Surgeon  Anesthesia Plan Comments:         Anesthesia Quick Evaluation

## 2016-09-24 NOTE — Discharge Summary (Signed)
Discharge Summary    Patient ID: Cody Brewer,  MRN: [id number], DOB/AGE: 08-22-1950 66 y.o.  Admit date: 09/21/2016 Discharge date: 09/24/2016  Primary Care Provider: [name] Primary Cardiologist: Dr. [name]   Discharge Diagnoses    Principal Problem:   Non-ST elevation (NSTEMI) myocardial infarction [facility]) Active Problems:   Coronary atherosclerosis   GERD   CHEST PAIN   Demand ischemia [facility])   Atrial fibrillation with RVR (HCC)   NICM - EF 30-35% by TEE 09/24/16   Hypertensive emergency without congestive heart failure   Chronic combined systolic and diastolic heart failure, NYHA class 2 (HCC)   S/P cardiac cath: (09/2016) a. nonobstructie CAD   Allergies No Known Allergies   History of Present Illness     This is a 66 y.o. 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  He was having rapid palpitations and SOB.   Hospital Course     Consultants: EP  In the emergency department the patient was called a Code STEMI due to ST elevation was observed in lead V1. However it was in the setting of LBBB/LVH. He also carried the diagnosis of hypertensive emergency; given cardizem, lisinopril, carvedilol, and bidil. He was having chest pain, NSTEMI, Trop 0.3--> 1.1--> 1.6. Atrial fibrillation with RVR on Eliquis/ switched to heparin drip in anticipation of cath and amiodarone bolus. His hx of chronic sys and diastolic heart failure had improved by last echocardiogram. A TEE was performed and the patient was taken for an elective cardioversion procedure done by Dr. [name] for the treatment of atrial flutter on 5/9. The patient converted to NSR; no complications.  The patient reported he is feeling better and requests to go home.  Plan is to switch Cardizem to CD 360 mg po QD and to continue lisinopril, carvedilol, bidil, ASA, coreg, diltiazem,   Morphine prn, added atorvastatin 40 mg po daily, cont Eliquis 5 mg po BID, Lasix 40 mg PO BID, Protonix and f/u with Dr. Donnie Aho. He was seen by Dr. Delton See today and deemed ready for home. Patient has been advised to call the office first thing in the morning and schedule a follow-up appointment with Dr. Donnie Aho.  Discharge medications are listed below.  _____________  Discharge Vitals Blood pressure (!) 127/95, pulse 69, temperature 97.8 F (36.6 C), resp. rate 16, height 6\' 1"  (1.854 m), weight 198 lb 3.2 oz (89.9 kg), SpO2 95 %.  Filed Weights   09/21/16 0433 09/22/16 0335 09/23/16 0406  Weight: 188 lb 6.4 oz (85.5 kg) 190 lb (86.2 kg) 198 lb 3.2 oz (89.9 kg)    Labs & Radiologic Studies     CBC  Recent Labs  09/23/16 0254 09/24/16 0211  WBC 8.6 8.2  HGB 13.7 13.5  HCT 41.4 41.2  MCV 77.2* 77.3*  PLT 225 253   Basic Metabolic Panel  Recent Labs  09/23/16 0254  NA 138  K 3.7  CL 105  CO2 25  GLUCOSE 111*  BUN 12  CREATININE 1.00  CALCIUM 8.6*  MG 2.0   Cardiac Enzymes  Recent Labs  09/21/16 1747  TROPONINI 1.69*    Dg Chest Port 1 View  Result Date: 09/21/2016 CLINICAL DATA:  Chest discomfort for several days. EXAM: PORTABLE CHEST 1 VIEW COMPARISON:  03/14/2016 FINDINGS: A single AP portable view of the chest demonstrates no focal airspace consolidation or  alveolar edema. The lungs are grossly clear. There is no large effusion or pneumothorax. Cardiac and mediastinal contours appear unremarkable. IMPRESSION: No active disease. Electronically Signed   By: Ellery Plunk M.D.   On: 09/21/2016 02:50     Diagnostic Studies/Procedures    TTE: 04/2016 - Left ventricle: The cavity size was mildly dilated. There was mild concentric hypertrophy. Systolic function was normal. The estimated ejection fraction was in the range of 55% to 60%. Wall motion was normal; there were no regional wall motion abnormalities. Doppler parameters are consistent with  abnormal left ventricular relaxation (grade 1 diastolic dysfunction). Doppler parameters are consistent with high ventricular filling pressure. - Aortic valve: Transvalvular velocity was within the normal range. There was no stenosis. There was no regurgitation. - Mitral valve: Transvalvular velocity was within the normal range. There was no evidence for stenosis. There was mild regurgitation in early systole. - Left atrium: The atrium was moderately dilated. - Right ventricle: The cavity size was normal. Wall thickness was normal. Systolic function was normal. - Tricuspid valve: There was mild regurgitation. - Pulmonary arteries: Systolic pressure was at the upper limits of normal. PA peak pressure: 37 mm Hg (S).  Cardiac Catheterization 09/23/2016  Left Heart Cath and Coronary Angiography  Conclusion     Mid LAD lesion, 20 %stenosed.  Dist LAD lesion, 20 %stenosed.  The left ventricular ejection fraction is 35-45% by visual estimate.  There is mild to moderate left ventricular systolic dysfunction, with severe basal hypokinesis. Unchanged from prior cath.  LV end diastolic pressure is normal.  There is no aortic valve stenosis.   Nonobstructive CAD.  Continue aggressive secondary prevention and management of atrial fibrillation.     _____________    Disposition   Pt is being discharged home today in good condition.  Follow-up Plans & Appointments    Follow-up Information    Marinus Maw, MD Follow up.   Specialty:  Cardiology Why:  office will conact you Contact information: [address] [place] [address] [phone]            Discharge Medications   Allergies as of 09/24/2016   No Known Allergies     Medication List    STOP taking these medications   famotidine 20 MG tablet Commonly known as:  PEPCID   naproxen 500 MG tablet Commonly known as:  NAPROSYN   nitroGLYCERIN 0.4 MG SL  tablet Commonly known as:  NITROSTAT   ranitidine 300 MG tablet Commonly known as:  ZANTAC   sucralfate 1 g tablet Commonly known as:  CARAFATE     TAKE these medications   acetaminophen 325 MG tablet Commonly known as:  TYLENOL Take 2 tablets (650 mg total) by mouth every 4 (four) hours as needed for headache or mild pain.   apixaban 5 MG Tabs tablet Commonly known as:  ELIQUIS Take 1 tablet (5 mg total) by mouth 2 (two) times daily. What changed:  when to take this   apixaban 5 MG Tabs tablet Commonly known as:  ELIQUIS Take 1 tablet (5 mg total) by mouth 2 (two) times daily. What changed:  You were already taking a medication with the same name, and this prescription was added. Make sure you understand how and when to take each.   aspirin 81 MG EC tablet Take 1 tablet (81 mg total) by mouth daily. Start taking on:  09/25/2016   atorvastatin 40 MG tablet Commonly known as:  LIPITOR Take 1 tablet (  40 mg total) by mouth daily at 6 PM.   b complex vitamins tablet Take 1 tablet by mouth daily.   carvedilol 25 MG tablet Commonly known as:  COREG Take 25 mg by mouth 2 (two) times daily with a meal.   diltiazem 360 MG 24 hr capsule Commonly known as:  CARDIZEM CD Take 1 capsule (360 mg total) by mouth daily. Start taking on:  09/25/2016   furosemide 40 MG tablet Commonly known as:  LASIX Take 1 tablet (40 mg total) by mouth daily.   hydrALAZINE 25 MG tablet Commonly known as:  APRESOLINE Take 1.5 tablets (37.5 mg total) by mouth 2 (two) times daily.   isosorbide dinitrate 20 MG tablet Commonly known as:  ISORDIL Take 1 tablet (20 mg total) by mouth 2 (two) times daily.   isosorbide-hydrALAZINE 20-37.5 MG tablet Commonly known as:  BIDIL Take 1 tablet by mouth 2 (two) times daily.   latanoprost 0.005 % ophthalmic solution Commonly known as:  XALATAN Place 1 drop into both eyes at bedtime.   lisinopril 10 MG tablet Commonly known as:  PRINIVIL,ZESTRIL Take 1  tablet (10 mg total) by mouth at bedtime.   pantoprazole 40 MG tablet Commonly known as:  PROTONIX Take 1 tablet (40 mg total) by mouth 2 (two) times daily.   polyethylene glycol packet Commonly known as:  MIRALAX / GLYCOLAX Take 17 g by mouth daily as needed for moderate constipation.   tamsulosin 0.4 MG Caps capsule Commonly known as:  FLOMAX Take 1 capsule (0.4 mg total) by mouth at bedtime.        Outstanding Labs/Studies   None  Duration of Discharge Encounter   Greater than 30 minutes including physician time.  Suan Halter PA-C 09/24/2016, 4:58 PM

## 2016-09-24 NOTE — Progress Notes (Signed)
Patient Name: Cody Brewer Date of Encounter: 09/24/2016  Principal Problem:   CHEST PAIN Active Problems:   Coronary atherosclerosis   GERD   Demand ischemia (HCC)   Atrial fibrillation with RVR (HCC)   NICM - EF 30-35% by TEE 09/24/16   Hypertensive emergency without congestive heart failure   Chronic combined systolic and diastolic heart failure, NYHA class 2 (HCC)   Length of Stay: 3  SUBJECTIVE  The patient feels BETTER AFTER CARDIOVERSION AND WANTS TO GO HOME.  CURRENT MEDS . apixaban  5 mg Oral BID  . aspirin EC  81 mg Oral Daily  . atorvastatin  40 mg Oral q1800  . B-complex with vitamin C  1 tablet Oral Daily  . carvedilol  25 mg Oral BID WC  . diltiazem  360 mg Oral Daily  . famotidine  20 mg Oral BID  . furosemide  40 mg Oral BID  . hydrALAZINE  37.5 mg Oral Q8H  . isosorbide dinitrate  20 mg Oral BID  . latanoprost  1 drop Both Eyes QHS  . lisinopril  10 mg Oral Daily  . pantoprazole  40 mg Oral BID  . sodium chloride flush  3 mL Intravenous Q12H  . sodium chloride flush  3 mL Intravenous Q12H  . sucralfate  1 g Oral QID  . tamsulosin  0.4 mg Oral QPC supper   OBJECTIVE  Vitals:   09/24/16 1055 09/24/16 1100 09/24/16 1129 09/24/16 1200  BP:  120/90 (!) 127/95   Pulse:  74 75 69  Resp:  19 19 16   Temp:   97.8 F (36.6 C)   TempSrc:      SpO2: 100% 99% 99% 95%  Weight:      Height:        Intake/Output Summary (Last 24 hours) at 09/24/16 1554 Last data filed at 09/24/16 1300  Gross per 24 hour  Intake          1678.53 ml  Output              676 ml  Net          1002.53 ml   Filed Weights   09/21/16 0433 09/22/16 0335 09/23/16 0406  Weight: 188 lb 6.4 oz (85.5 kg) 190 lb (86.2 kg) 198 lb 3.2 oz (89.9 kg)   PHYSICAL EXAM  General: Pleasant, NAD. Neuro: Alert and oriented X 3. Moves all extremities spontaneously. Psych: Normal affect. HEENT:  Normal  Neck: Supple without bruits or JVD. Lungs:  Resp regular and unlabored, CTA.    Heart: RRR no s3, s4, or murmurs. Abdomen: Soft, non-tender, non-distended, BS + x 4.  Extremities: No clubbing, cyanosis, no LE edema. DP/PT/Radials 2+ and equal bilaterally.  Accessory Clinical Findings  CBC  Recent Labs  09/23/16 0254 09/24/16 0211  WBC 8.6 8.2  HGB 13.7 13.5  HCT 41.4 41.2  MCV 77.2* 77.3*  PLT 225 253   Basic Metabolic Panel  Recent Labs  09/23/16 0254  NA 138  K 3.7  CL 105  CO2 25  GLUCOSE 111*  BUN 12  CREATININE 1.00  CALCIUM 8.6*  MG 2.0   Liver Function Tests No results for input(s): AST, ALT, ALKPHOS, BILITOT, PROT, ALBUMIN in the last 72 hours.  Recent Labs  09/21/16 1747  TROPONINI 1.69*   Dg Chest Port 1 View  Result Date: 09/21/2016 CLINICAL DATA:  Chest discomfort for several days. EXAM: PORTABLE CHEST 1 VIEW COMPARISON:  03/14/2016 FINDINGS: A single  AP portable view of the chest demonstrates no focal airspace consolidation or alveolar edema. The lungs are grossly clear. There is no large effusion or pneumothorax. Cardiac and mediastinal contours appear unremarkable. IMPRESSION: No active disease. Electronically Signed   By: Ellery Plunk M.D.   On: 09/21/2016 02:50   TTE: 04/2016 - Left ventricle: The cavity size was mildly dilated. There was   mild concentric hypertrophy. Systolic function was normal. The   estimated ejection fraction was in the range of 55% to 60%. Wall   motion was normal; there were no regional wall motion   abnormalities. Doppler parameters are consistent with abnormal   left ventricular relaxation (grade 1 diastolic dysfunction).   Doppler parameters are consistent with high ventricular filling   pressure. - Aortic valve: Transvalvular velocity was within the normal range.   There was no stenosis. There was no regurgitation. - Mitral valve: Transvalvular velocity was within the normal range.   There was no evidence for stenosis. There was mild regurgitation   in early systole. - Left atrium:  The atrium was moderately dilated. - Right ventricle: The cavity size was normal. Wall thickness was   normal. Systolic function was normal. - Tricuspid valve: There was mild regurgitation. - Pulmonary arteries: Systolic pressure was at the upper limits of   normal. PA peak pressure: 37 mm Hg (S).  Telemetry: personally reviewed. A-fib with HR in 90', up to 140 with motion    ASSESSMENT AND PLAN  This is a 66 y.o. male with non obstructive non ischemic cardiomyopathy, s/p SVT ablation, symptomatic atrial flutter.   Principal Problem:   Hypertensive emergency without congestive heart failure Active Problems:   GERD   CHEST PAIN   Demand ischemia (HCC)   Atrial fibrillation with RVR (HCC)   Chronic combined systolic and diastolic heart failure, NYHA class 2 (HCC)  Hypertensive emergency with elevated troponin, LV strain on ECG, chest pain - now controlled - switched cardizem to CD 360 mg po QD - continue lisinopril, carvedilol, bidil  Chest pain, NSTEMI, troponin 0.3 -> 1.1 -> 1.6, We held Eliquis, started Heparin drip and plan for a cardiac cath today could be secondary to microvascular disease, recent stress test did not demonstrate any infarction/ischemia, review of 2015 cath showed no significant disease, ECG did not demonstrate ST elevation in the setting of LBBB - aspirin, continue bidil, coreg, diltiazem for angina. Morphine prn  - added atorvastatin 40 mg po daily  Atrial fibrillation with RVR complicated by chest pain, CHADS2VASc score = 2 (age, htn) - this new, if he has no CAD, in SR after DCCV today - Continue Eliquis 5 mg po BID  Acute on chronic systolic and diastolic heart failure, NYHA class II improved LVEF by last echocardiogram  - continue bidil, lisinopril, aspirin, control bp - diuresed well with iv Lasix, home dose lasix 40 mg PO BID  Follow up with Dr Donnie Aho.  GERD - continue protonix  Signed, Tobias Alexander MD, [facility] 09/24/2016

## 2016-09-24 NOTE — Transfer of Care (Signed)
Immediate Anesthesia Transfer of Care Note  Patient: Cody Brewer  Procedure(s) Performed: Procedure(s): CARDIOVERSION (N/A) TRANSESOPHAGEAL ECHOCARDIOGRAM (TEE) (N/A)  Patient Location: PACU and Endoscopy Unit  Anesthesia Type:MAC  Level of Consciousness: patient cooperative and lethargic  Airway & Oxygen Therapy: Patient connected to nasal cannula oxygen  Post-op Assessment: Report given to RN and Post -op Vital signs reviewed and stable  Post vital signs: Reviewed and stable  Last Vitals:  Vitals:   09/24/16 0400 09/24/16 0924  BP:  (!) 145/88  Pulse:  (!) 109  Resp:  20  Temp: 36.8 C 36.4 C    Last Pain:  Vitals:   09/24/16 0927  TempSrc:   PainSc: 4       Patients Stated Pain Goal: 2 (20/60/15 6153)  Complications: No apparent anesthesia complications

## 2016-09-24 NOTE — Progress Notes (Signed)
ANTICOAGULATION CONSULT NOTE - Follow Up Consult  Pharmacy Consult for Heparin Indication:  atrial fibrillation  No Known Allergies  Patient Measurements: Height: 6\' 1"  (185.4 cm) Weight: 198 lb 3.2 oz (89.9 kg) IBW/kg (Calculated) : 79.9  Vital Signs: Temp: 97.8 F (36.6 C) (05/09 1129) Temp Source: Oral (05/09 1040) BP: 127/95 (05/09 1129) Pulse Rate: 75 (05/09 1129)  Labs:  Recent Labs  09/21/16 1747 09/22/16 1830 09/23/16 0254 09/24/16 0211  HGB  --   --  13.7 13.5  HCT  --   --  41.4 41.2  PLT  --   --  225 253  APTT  --  51* 85*  --   HEPARINUNFRC  --  0.83* 0.72* 0.39  CREATININE  --   --  1.00  --   TROPONINI 1.69*  --   --   --     Estimated Creatinine Clearance: 83.2 mL/min (by C-G formula based on SCr of 1 mg/dL).  Assessment: 66yom on eliquis pta for afib (last dose 5/6 @ 2101), transitioned to heparin 5/7 for NSTEMI. He is s/p cath yesterday found to have nonobstructive CAD and heparin resumed for afib. Heparin level is therapeutic at 0.39. CBC stable. No bleeding. S/P successful DCCV today,  Goal of Therapy:  Heparin level 0.3-0.7 units/ml Monitor platelets by anticoagulation protocol: Yes   Plan:  1) Continue heparin at 1400 units/hr 2) Daily heparin level and CBC 3) Follow up transition back to eliquis  Fredrik Rigger 09/24/2016,12:06 PM

## 2016-09-24 NOTE — Progress Notes (Signed)
ANTICOAGULATION CONSULT NOTE - Follow Up Consult  Pharmacy Consult for Heparin > Eliquis Indication:  atrial fibrillation  No Known Allergies  Patient Measurements: Height: 6\' 1"  (185.4 cm) Weight: 198 lb 3.2 oz (89.9 kg) IBW/kg (Calculated) : 79.9  Vital Signs: Temp: 97.8 F (36.6 C) (05/09 1129) Temp Source: Oral (05/09 1040) BP: 127/95 (05/09 1129) Pulse Rate: 69 (05/09 1200)  Labs:  Recent Labs  09/21/16 1747 09/22/16 1830 09/23/16 0254 09/24/16 0211  HGB  --   --  13.7 13.5  HCT  --   --  41.4 41.2  PLT  --   --  225 253  APTT  --  51* 85*  --   HEPARINUNFRC  --  0.83* 0.72* 0.39  CREATININE  --   --  1.00  --   TROPONINI 1.69*  --   --   --     Estimated Creatinine Clearance: 83.2 mL/min (by C-G formula based on SCr of 1 mg/dL).  Assessment: 66yom on Eliquis pta for afib (last dose 5/6 @ 2101), transitioned to heparin 5/7 for NSTEMI. He is s/p cath yesterday found to have nonobstructive CAD and heparin resumed for afib. Now to restart Eliquis for AFib, he is s/p successful DCCV today.  Patient does not meet qualifications for dose reduction of Eliquis  Goal of Therapy:  Heparin level 0.3-0.7 units/ml Monitor platelets by anticoagulation protocol: Yes   Plan:  -Continue heparin at 1400 units/hr until 2159 -Start Eliquis 5mg  PO BID- first dose at 2200 tonight -CBC q72h at minimum, and watch for s/s bleeding -Provide re-education on Eliquis  Melaysia Streed D. Daeton Kluth, PharmD, BCPS Clinical Pharmacist Pager: [phone] 09/24/2016 3:34 PM

## 2016-09-24 NOTE — Anesthesia Postprocedure Evaluation (Addendum)
Anesthesia Post Note  Patient: Cody Brewer  Procedure(s) Performed: Procedure(s) (LRB): CARDIOVERSION (N/A) TRANSESOPHAGEAL ECHOCARDIOGRAM (TEE) (N/A)  Patient location during evaluation: Endoscopy Anesthesia Type: MAC Level of consciousness: awake and alert Pain management: pain level controlled Vital Signs Assessment: post-procedure vital signs reviewed and stable Respiratory status: spontaneous breathing and respiratory function stable Cardiovascular status: stable Anesthetic complications: no       Last Vitals:  Vitals:   09/24/16 1129 09/24/16 1200  BP: (!) 127/95   Pulse: 75 69  Resp: 19 16  Temp: 36.6 C     Last Pain:  Vitals:   09/24/16 1040  TempSrc: Oral  PainSc:                  Cody Brewer,Cody Brewer

## 2016-09-24 NOTE — Anesthesia Procedure Notes (Signed)
Procedure Name: MAC Performed by: Lowella Dell Pre-anesthesia Checklist: Patient identified, Emergency Drugs available, Suction available, Patient being monitored and Timeout performed Patient Re-evaluated:Patient Re-evaluated prior to inductionOxygen Delivery Method: Nasal cannula Intubation Type: IV induction Placement Confirmation: positive ETCO2 Dental Injury: Teeth and Oropharynx as per pre-operative assessment

## 2016-09-25 ENCOUNTER — Encounter (HOSPITAL_COMMUNITY): Payer: Self-pay | Admitting: Cardiovascular Disease

## 2016-09-25 DIAGNOSIS — Z9889 Other specified postprocedural states: Secondary | ICD-10-CM

## 2016-09-25 DIAGNOSIS — I214 Non-ST elevation (NSTEMI) myocardial infarction: Principal | ICD-10-CM

## 2016-09-25 DIAGNOSIS — I208 Other forms of angina pectoris: Secondary | ICD-10-CM

## 2016-09-25 LAB — CBC
HEMATOCRIT: 40.1 % (ref 39.0–52.0)
Hemoglobin: 13.2 g/dL (ref 13.0–17.0)
MCH: 25.5 pg — ABNORMAL LOW (ref 26.0–34.0)
MCHC: 32.9 g/dL (ref 30.0–36.0)
MCV: 77.6 fL — ABNORMAL LOW (ref 78.0–100.0)
PLATELETS: 239 10*3/uL (ref 150–400)
RBC: 5.17 MIL/uL (ref 4.22–5.81)
RDW: 14.4 % (ref 11.5–15.5)
WBC: 6.3 10*3/uL (ref 4.0–10.5)

## 2016-09-25 LAB — GLUCOSE, CAPILLARY: Glucose-Capillary: 104 mg/dL — ABNORMAL HIGH (ref 65–99)

## 2016-09-25 NOTE — Discharge Summary (Signed)
   Discharge Summary    Patient ID: Cody Brewer,  MRN: [id number], DOB/AGE: 06/29/1950 66 y.o.  Admit date: 09/21/2016 Discharge date: 09/24/2016  Primary Care Provider: Ross, Charles Alan Primary Cardiologist: Dr. Gregg Taylor   Discharge Diagnoses    Principal Problem:   Non-ST elevation (NSTEMI) myocardial infarction (HCC) Active Problems:   Coronary atherosclerosis   GERD   CHEST PAIN   Demand ischemia (HCC)   Atrial fibrillation with RVR (HCC)   NICM - EF 30-35% by TEE 09/24/16   Hypertensive emergency without congestive heart failure   Chronic combined systolic and diastolic heart failure, NYHA class 2 (HCC)   S/P cardiac cath: (09/2016) a. nonobstructie CAD   Allergies No Known Allergies   History of Present Illness     This is a 66 y.o. 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  He was having rapid palpitations and SOB.   Hospital Course     Consultants: EP  In the emergency department the patient was called a Code STEMI due to ST elevation was observed in lead V1. However it was in the setting of LBBB/LVH. He also carried the diagnosis of hypertensive emergency; given cardizem, lisinopril, carvedilol, and bidil. He was having chest pain, NSTEMI, Trop 0.3--> 1.1--> 1.6. Atrial fibrillation with RVR on Eliquis/ switched to heparin drip in anticipation of cath and amiodarone bolus. His hx of chronic sys and diastolic heart failure had improved by last echocardiogram. A TEE was performed and the patient was taken for an elective cardioversion procedure done by Dr. Tiffany Goshen for the treatment of atrial flutter on 5/9. The patient converted to NSR; no complications.  The patient reported he is feeling better and requests to go home.  Plan is to switch Cardizem to CD 360 mg po QD and to continue lisinopril, carvedilol, bidil, ASA, coreg, diltiazem,   Morphine prn, added atorvastatin 40 mg po daily, cont Eliquis 5 mg po BID, Lasix 40 mg PO BID, Protonix and f/u with Dr. Tilley. He was seen by Dr. Azion Centrella today and deemed ready for home. Patient has been advised to call the office first thing in the morning and schedule a follow-up appointment with Dr. Tilley.  Discharge medications are listed below.  _____________  Discharge Vitals Blood pressure (!) 127/95, pulse 69, temperature 97.8 F (36.6 C), resp. rate 16, height 6' 1" (1.854 m), weight 198 lb 3.2 oz (89.9 kg), SpO2 95 %.  Filed Weights   09/21/16 0433 09/22/16 0335 09/23/16 0406  Weight: 188 lb 6.4 oz (85.5 kg) 190 lb (86.2 kg) 198 lb 3.2 oz (89.9 kg)    Labs & Radiologic Studies     CBC  Recent Labs  09/23/16 0254 09/24/16 0211  WBC 8.6 8.2  HGB 13.7 13.5  HCT 41.4 41.2  MCV 77.2* 77.3*  PLT 225 253   Basic Metabolic Panel  Recent Labs  09/23/16 0254  NA 138  K 3.7  CL 105  CO2 25  GLUCOSE 111*  BUN 12  CREATININE 1.00  CALCIUM 8.6*  MG 2.0   Cardiac Enzymes  Recent Labs  09/21/16 1747  TROPONINI 1.69*    Dg Chest Port 1 View  Result Date: 09/21/2016 CLINICAL DATA:  Chest discomfort for several days. EXAM: PORTABLE CHEST 1 VIEW COMPARISON:  03/14/2016 FINDINGS: A single AP portable view of the chest demonstrates no focal airspace consolidation or   alveolar edema. The lungs are grossly clear. There is no large effusion or pneumothorax. Cardiac and mediastinal contours appear unremarkable. IMPRESSION: No active disease. Electronically Signed   By: Daniel R Mitchell M.D.   On: 09/21/2016 02:50     Diagnostic Studies/Procedures    TTE: 04/2016 - Left ventricle: The cavity size was mildly dilated. There was mild concentric hypertrophy. Systolic function was normal. The estimated ejection fraction was in the range of 55% to 60%. Wall motion was normal; there were no regional wall motion abnormalities. Doppler parameters are consistent with  abnormal left ventricular relaxation (grade 1 diastolic dysfunction). Doppler parameters are consistent with high ventricular filling pressure. - Aortic valve: Transvalvular velocity was within the normal range. There was no stenosis. There was no regurgitation. - Mitral valve: Transvalvular velocity was within the normal range. There was no evidence for stenosis. There was mild regurgitation in early systole. - Left atrium: The atrium was moderately dilated. - Right ventricle: The cavity size was normal. Wall thickness was normal. Systolic function was normal. - Tricuspid valve: There was mild regurgitation. - Pulmonary arteries: Systolic pressure was at the upper limits of normal. PA peak pressure: 37 mm Hg (S).  Cardiac Catheterization 09/23/2016  Left Heart Cath and Coronary Angiography  Conclusion     Mid LAD lesion, 20 %stenosed.  Dist LAD lesion, 20 %stenosed.  The left ventricular ejection fraction is 35-45% by visual estimate.  There is mild to moderate left ventricular systolic dysfunction, with severe basal hypokinesis. Unchanged from prior cath.  LV end diastolic pressure is normal.  There is no aortic valve stenosis.   Nonobstructive CAD.  Continue aggressive secondary prevention and management of atrial fibrillation.     _____________    Disposition   Pt is being discharged home today in good condition.  Follow-up Plans & Appointments    Follow-up Information    Taylor, Gregg W, MD Follow up.   Specialty:  Cardiology Why:  office will conact you Contact information: [address] [place] [address] [phone]            Discharge Medications   Allergies as of 09/24/2016   No Known Allergies     Medication List    STOP taking these medications   famotidine 20 MG tablet Commonly known as:  PEPCID   naproxen 500 MG tablet Commonly known as:  NAPROSYN   nitroGLYCERIN 0.4 MG SL  tablet Commonly known as:  NITROSTAT   ranitidine 300 MG tablet Commonly known as:  ZANTAC   sucralfate 1 g tablet Commonly known as:  CARAFATE     TAKE these medications   acetaminophen 325 MG tablet Commonly known as:  TYLENOL Take 2 tablets (650 mg total) by mouth every 4 (four) hours as needed for headache or mild pain.   apixaban 5 MG Tabs tablet Commonly known as:  ELIQUIS Take 1 tablet (5 mg total) by mouth 2 (two) times daily. What changed:  when to take this   apixaban 5 MG Tabs tablet Commonly known as:  ELIQUIS Take 1 tablet (5 mg total) by mouth 2 (two) times daily. What changed:  You were already taking a medication with the same name, and this prescription was added. Make sure you understand how and when to take each.   aspirin 81 MG EC tablet Take 1 tablet (81 mg total) by mouth daily. Start taking on:  09/25/2016   atorvastatin 40 MG tablet Commonly known as:  LIPITOR Take 1 tablet (  40 mg total) by mouth daily at 6 PM.   b complex vitamins tablet Take 1 tablet by mouth daily.   carvedilol 25 MG tablet Commonly known as:  COREG Take 25 mg by mouth 2 (two) times daily with a meal.   diltiazem 360 MG 24 hr capsule Commonly known as:  CARDIZEM CD Take 1 capsule (360 mg total) by mouth daily. Start taking on:  09/25/2016   furosemide 40 MG tablet Commonly known as:  LASIX Take 1 tablet (40 mg total) by mouth daily.   hydrALAZINE 25 MG tablet Commonly known as:  APRESOLINE Take 1.5 tablets (37.5 mg total) by mouth 2 (two) times daily.   isosorbide dinitrate 20 MG tablet Commonly known as:  ISORDIL Take 1 tablet (20 mg total) by mouth 2 (two) times daily.   isosorbide-hydrALAZINE 20-37.5 MG tablet Commonly known as:  BIDIL Take 1 tablet by mouth 2 (two) times daily.   latanoprost 0.005 % ophthalmic solution Commonly known as:  XALATAN Place 1 drop into both eyes at bedtime.   lisinopril 10 MG tablet Commonly known as:  PRINIVIL,ZESTRIL Take 1  tablet (10 mg total) by mouth at bedtime.   pantoprazole 40 MG tablet Commonly known as:  PROTONIX Take 1 tablet (40 mg total) by mouth 2 (two) times daily.   polyethylene glycol packet Commonly known as:  MIRALAX / GLYCOLAX Take 17 g by mouth daily as needed for moderate constipation.   tamsulosin 0.4 MG Caps capsule Commonly known as:  FLOMAX Take 1 capsule (0.4 mg total) by mouth at bedtime.        Outstanding Labs/Studies   None  Duration of Discharge Encounter   Greater than 30 minutes including physician time.  Signed, GREENE,TIFFANY G PA-C 09/24/2016, 4:58 PM  

## 2016-09-25 NOTE — Care Management Note (Signed)
Case Management Note Original Note Created by Isidoro Donning 09/23/16  Patient Details  Name: Cody Brewer MRN: [id number] Date of Birth: [birth year]  Subjective/Objective:   afib with RVR, CHF                 Action/Plan: Discharge Planning: NCM spoke to p and wife at bedside. Pt states he is on a fixed income and Bidil is expensive. Provided pt with [employer] PAP for [employer]. Wife will call to see if he qualifies. On Eliquis prior to admission. Pt was independent prior to hospital stay. Takes meds as prescribed and weighs daily. Will continue to follow for dc needs.   PCP ROSS, Darlen Round MD   Expected Discharge Date:  09/24/16               Expected Discharge Plan:  Home/Self Care  In-House Referral:  NA  Discharge planning Services  CM Consult  Post Acute Care Choice:  NA Choice offered to:  NA  DME Arranged:  N/A DME Agency:  NA  HH Arranged:  NA HH Agency:  NA  Status of Service:  Completed, signed off  If discussed at Long Length of Stay Meetings, dates discussed:    Additional Comments: 09/25/2016 CM spoke with pt prior to discharge.  Pt states he does have active insurance, PCP and prescription coverage.  Pt stated he has not yet contacted PAP for Bidil - however he still has the information and plans to do it post discharge.  Pt denied barriers to paying for Eliquis. CM faxed [facility] on [address] (pharmacy of choice) discount card for Bidil. No other CM needs identified prior to discharge Cherylann Parr, RN 09/25/2016, 8:43 AM

## 2016-09-25 NOTE — Discharge Instructions (Signed)
Electrical Cardioversion Electrical cardioversion is the delivery of a jolt of electricity to restore a normal rhythm to the heart. A rhythm that is too fast or is not regular keeps the heart from pumping well. In this procedure, sticky patches or metal paddles are placed on the chest to deliver electricity to the heart from a device. This procedure may be done in an emergency if:  There is low or no blood pressure as a result of the heart rhythm.  Normal rhythm must be restored as fast as possible to protect the brain and heart from further damage.  It may save a life. This procedure may also be done for irregular or fast heart rhythms that are not immediately life-threatening. Tell a health care provider about:  Any allergies you have.  All medicines you are taking, including vitamins, herbs, eye drops, creams, and over-the-counter medicines.  Any problems you or family members have had with anesthetic medicines.  Any blood disorders you have.  Any surgeries you have had.  Any medical conditions you have.  Whether you are pregnant or may be pregnant. What are the risks? Generally, this is a safe procedure. However, problems may occur, including:  Allergic reactions to medicines.  A blood clot that breaks free and travels to other parts of your body.  The possible return of an abnormal heart rhythm within hours or days after the procedure.  Your heart stopping (cardiac arrest). This is rare. What happens before the procedure? Medicines   Your health care provider may have you start taking:  Blood-thinning medicines (anticoagulants) so your blood does not clot as easily.  Medicines may be given to help stabilize your heart rate and rhythm.  Ask your health care provider about changing or stopping your regular medicines. This is especially important if you are taking diabetes medicines or blood thinners. General instructions   Plan to have someone take you home from the  hospital or clinic.  If you will be going home right after the procedure, plan to have someone with you for 24 hours.  Follow instructions from your health care provider about eating or drinking restrictions. What happens during the procedure?  To lower your risk of infection:  Your health care team will wash or sanitize their hands.  Your skin will be washed with soap.  An IV tube will be inserted into one of your veins.  You will be given a medicine to help you relax (sedative).  Sticky patches (electrodes) or metal paddles may be placed on your chest.  An electrical shock will be delivered. The procedure may vary among health care providers and hospitals. What happens after the procedure?  Your blood pressure, heart rate, breathing rate, and blood oxygen level will be monitored until the medicines you were given have worn off.  Do not drive for 24 hours if you were given a sedative.  Your heart rhythm will be watched to make sure it does not change. This information is not intended to replace advice given to you by your health care provider. Make sure you discuss any questions you have with your health care provider. Document Released: 04/25/2002 Document Revised: 01/02/2016 Document Reviewed: 11/09/2015 Elsevier Interactive Patient Education  2017 [employer].   Information on my medicine - ELIQUIS (apixaban)  This medication education was reviewed with me or my healthcare representative as part of my discharge preparation.   Why was Eliquis prescribed for you? Eliquis was prescribed for you to reduce the risk of a  blood clot forming that can cause a stroke if you have a medical condition called atrial fibrillation (a type of irregular heartbeat).  What do You need to know about Eliquis ? Take your Eliquis TWICE DAILY - one tablet in the morning and one tablet in the evening with or without food. If you have difficulty swallowing the tablet whole please discuss with  your pharmacist how to take the medication safely.  Take Eliquis exactly as prescribed by your doctor and DO NOT stop taking Eliquis without talking to the doctor who prescribed the medication.  Stopping may increase your risk of developing a stroke.  Refill your prescription before you run out.  After discharge, you should have regular check-up appointments with your healthcare provider that is prescribing your Eliquis.  In the future your dose may need to be changed if your kidney function or weight changes by a significant amount or as you get older.  What do you do if you miss a dose? If you miss a dose, take it as soon as you remember on the same day and resume taking twice daily.  Do not take more than one dose of ELIQUIS at the same time to make up a missed dose.  Important Safety Information A possible side effect of Eliquis is bleeding. You should call your healthcare provider right away if you experience any of the following: ? Bleeding from an injury or your nose that does not stop. ? Unusual colored urine (red or dark brown) or unusual colored stools (red or black). ? Unusual bruising for unknown reasons. ? A serious fall or if you hit your head (even if there is no bleeding).  Some medicines may interact with Eliquis and might increase your risk of bleeding or clotting while on Eliquis. To help avoid this, consult your healthcare provider or pharmacist prior to using any new prescription or non-prescription medications, including herbals, vitamins, non-steroidal anti-inflammatory drugs (NSAIDs) and supplements.  This website has more information on Eliquis (apixaban): http://www.eliquis.com/eliquis/home

## 2016-09-25 NOTE — Progress Notes (Signed)
Patient Name: Cody Brewer Date of Encounter: 09/25/2016  Principal Problem:   Non-ST elevation (NSTEMI) myocardial infarction [facility]) Active Problems:   Coronary atherosclerosis   GERD   CHEST PAIN   Demand ischemia (HCC)   Atrial fibrillation with RVR (HCC)   NICM - EF 30-35% by TEE 09/24/16   Hypertensive emergency without congestive heart failure   Chronic combined systolic and diastolic heart failure, NYHA class 2 (HCC)   S/P cardiac cath: (09/2016) a. nonobstructie CAD   Length of Stay: 4  SUBJECTIVE  The patient started to walk, he has mild SOB, overall feels better since the cardioversion yesterday, remains in SR.  CURRENT MEDS . apixaban  5 mg Oral BID  . aspirin EC  81 mg Oral Daily  . atorvastatin  40 mg Oral q1800  . B-complex with vitamin C  1 tablet Oral Daily  . carvedilol  25 mg Oral BID WC  . diltiazem  360 mg Oral Daily  . famotidine  20 mg Oral BID  . furosemide  40 mg Oral BID  . hydrALAZINE  37.5 mg Oral Q8H  . isosorbide dinitrate  20 mg Oral BID  . latanoprost  1 drop Both Eyes QHS  . lisinopril  10 mg Oral Daily  . pantoprazole  40 mg Oral BID  . sodium chloride flush  3 mL Intravenous Q12H  . sodium chloride flush  3 mL Intravenous Q12H  . sucralfate  1 g Oral QID  . tamsulosin  0.4 mg Oral QPC supper   OBJECTIVE  Vitals:   09/25/16 0016 09/25/16 0327 09/25/16 0637 09/25/16 0747  BP: (!) 147/99 127/73 (!) 155/99   Pulse: 99 74    Resp: 16 19    Temp: 98.3 F (36.8 C) 98.4 F (36.9 C)  98.3 F (36.8 C)  TempSrc: Oral Oral  Oral  SpO2: 97% 97%    Weight:  191 lb 1.6 oz (86.7 kg)    Height:        Intake/Output Summary (Last 24 hours) at 09/25/16 0919 Last data filed at 09/24/16 2000  Gross per 24 hour  Intake             1006 ml  Output               50 ml  Net              956 ml   Filed Weights   09/22/16 0335 09/23/16 0406 09/25/16 0327  Weight: 190 lb (86.2 kg) 198 lb 3.2 oz (89.9 kg) 191 lb 1.6 oz (86.7 kg)    PHYSICAL EXAM  General: Pleasant, NAD. Neuro: Alert and oriented X 3. Moves all extremities spontaneously. Psych: Normal affect. HEENT:  Normal  Neck: Supple without bruits or JVD. Lungs:  Resp regular and unlabored, CTA.  Heart: RRR no s3, s4, or murmurs. Abdomen: Soft, non-tender, non-distended, BS + x 4.  Extremities: No clubbing, cyanosis, no LE edema. DP/PT/Radials 2+ and equal bilaterally.  Accessory Clinical Findings  CBC  Recent Labs  09/24/16 0211 09/25/16 0528  WBC 8.2 6.3  HGB 13.5 13.2  HCT 41.2 40.1  MCV 77.3* 77.6*  PLT 253 239   Basic Metabolic Panel  Recent Labs  09/23/16 0254  NA 138  K 3.7  CL 105  CO2 25  GLUCOSE 111*  BUN 12  CREATININE 1.00  CALCIUM 8.6*  MG 2.0   Liver Function Tests No results for input(s): AST, ALT, ALKPHOS, BILITOT, PROT, ALBUMIN  in the last 72 hours. No results for input(s): CKTOTAL, CKMB, CKMBINDEX, TROPONINI in the last 72 hours. Dg Chest Port 1 View  Result Date: 09/21/2016 CLINICAL DATA:  Chest discomfort for several days. EXAM: PORTABLE CHEST 1 VIEW COMPARISON:  03/14/2016 FINDINGS: A single AP portable view of the chest demonstrates no focal airspace consolidation or alveolar edema. The lungs are grossly clear. There is no large effusion or pneumothorax. Cardiac and mediastinal contours appear unremarkable. IMPRESSION: No active disease. Electronically Signed   By: Ellery Plunk M.D.   On: 09/21/2016 02:50   TTE: 04/2016 - Left ventricle: The cavity size was mildly dilated. There was   mild concentric hypertrophy. Systolic function was normal. The   estimated ejection fraction was in the range of 55% to 60%. Wall   motion was normal; there were no regional wall motion   abnormalities. Doppler parameters are consistent with abnormal   left ventricular relaxation (grade 1 diastolic dysfunction).   Doppler parameters are consistent with high ventricular filling   pressure. - Aortic valve: Transvalvular  velocity was within the normal range.   There was no stenosis. There was no regurgitation. - Mitral valve: Transvalvular velocity was within the normal range.   There was no evidence for stenosis. There was mild regurgitation   in early systole. - Left atrium: The atrium was moderately dilated. - Right ventricle: The cavity size was normal. Wall thickness was   normal. Systolic function was normal. - Tricuspid valve: There was mild regurgitation. - Pulmonary arteries: Systolic pressure was at the upper limits of   normal. PA peak pressure: 37 mm Hg (S).  Telemetry: personally reviewed. A-fib with HR in 90', up to 140 with motion    ASSESSMENT AND PLAN  This is a 66 y.o. male with non obstructive non ischemic cardiomyopathy, s/p SVT ablation, symptomatic atrial flutter.   Principal Problem:   Hypertensive emergency without congestive heart failure Active Problems:   GERD   CHEST PAIN   Demand ischemia (HCC)   Atrial fibrillation with RVR (HCC)   Chronic combined systolic and diastolic heart failure, NYHA class 2 (HCC)  Hypertensive emergency with elevated troponin, LV strain on ECG, chest pain - now controlled - switched cardizem to CD 360 mg po QD - continue lisinopril, carvedilol, bidil  Chest pain, NSTEMI, troponin 0.3 -> 1.1 -> 1.6, We held Eliquis, started Heparin drip and plan for a cardiac cath today could be secondary to microvascular disease, recent stress test did not demonstrate any infarction/ischemia, review of 2015 cath showed no significant disease, ECG did not demonstrate ST elevation in the setting of LBBB - aspirin, continue bidil, coreg, diltiazem for angina. Morphine prn  - added atorvastatin 40 mg po daily  Atrial fibrillation with RVR complicated by chest pain, CHADS2VASc score = 2 (age, htn) - this new, if he has no CAD, in SR after DCCV yesterday  - Continue Eliquis 5 mg po BID  Acute on chronic systolic and diastolic heart failure, NYHA class II  improved LVEF by last echocardiogram  - continue bidil, lisinopril, aspirin, control bp - diuresed well with iv Lasix, home dose lasix 40 mg PO BID  Follow up with Dr Donnie Aho. We will ask case manager for an Eliquis card.  GERD - continue protonix  Signed, Tobias Alexander MD, [facility] 09/25/2016

## 2016-09-26 ENCOUNTER — Telehealth: Payer: Self-pay | Admitting: Internal Medicine

## 2016-09-26 NOTE — Telephone Encounter (Signed)
New Message  Pt voiced he is needing to either speak MD-Taylor or the nurse and would for someone to give him a call back about his ED visit.

## 2016-09-26 NOTE — Telephone Encounter (Signed)
Called, spoke with pt and pt's wife on speaker phone. Pt stated his summary from hospital d/c date 5/9 listed incorrect medication information. Pt stated isosorbide-hydralazine (Bidil)  was listed under medications to take by mistake. Pt was upset because both Bidil and Isordil were listed under medications to take. Pt stated his PCP brought this to the pt's attention. Reviewed all medications. Pt is taking isosorbide dinitrate (Isordil) 20 mg twice daily (pt not taking Bidil).   Pt wants to know when he may resume normal daily activity and sexual activity. Informed I will forward to Dr. Ladona Ridgel to advise. He will be back in the office next week. Informed I will be in touch with pt as soon as I hear back from Dr. Ladona Ridgel. Advised to follow activity instructions listed on hospital d/c paperwork. Pt verbalized understanding and thanked me for calling.

## 2016-10-04 NOTE — Telephone Encounter (Signed)
No limitation to physical activity. GT

## 2016-10-06 NOTE — Telephone Encounter (Signed)
Called, spoke with pt. Informed Dr. Lubertha Basque recommendation - No limitation to physical activity. Pt verbalized understanding.

## 2016-10-18 NOTE — Addendum Note (Signed)
Addendum  created 10/18/16 0953 by Stepen Prins, Dorance, MD   Sign clinical note    

## 2016-10-20 ENCOUNTER — Ambulatory Visit (INDEPENDENT_AMBULATORY_CARE_PROVIDER_SITE_OTHER): Payer: Medicare Other | Admitting: Internal Medicine

## 2016-10-20 ENCOUNTER — Encounter: Payer: Self-pay | Admitting: Internal Medicine

## 2016-10-20 VITALS — BP 110/78 | HR 73 | Ht 73.0 in | Wt 187.0 lb

## 2016-10-20 DIAGNOSIS — I480 Paroxysmal atrial fibrillation: Secondary | ICD-10-CM | POA: Diagnosis not present

## 2016-10-20 DIAGNOSIS — I10 Essential (primary) hypertension: Secondary | ICD-10-CM | POA: Diagnosis not present

## 2016-10-20 DIAGNOSIS — I5022 Chronic systolic (congestive) heart failure: Secondary | ICD-10-CM | POA: Diagnosis not present

## 2016-10-20 DIAGNOSIS — I519 Heart disease, unspecified: Secondary | ICD-10-CM | POA: Diagnosis not present

## 2016-10-20 DIAGNOSIS — I428 Other cardiomyopathies: Secondary | ICD-10-CM

## 2016-10-20 NOTE — Patient Instructions (Addendum)
Medication Instructions:  Your physician has recommended you make the following change in your medication:  STOP Lipitor  STOP Cardizem   Labwork: None Ordered   Testing/Procedures: Your physician has requested that you have an echocardiogram. Echocardiography is a painless test that uses sound waves to create images of your heart. It provides your doctor with information about the size and shape of your heart and how well your heart's chambers and valves are working. This procedure takes approximately one hour. There are no restrictions for this procedure.--schedule in 2 months     Follow-Up: Your physician recommends that you schedule a follow-up appointment in: 3 months with Dr. Ladona Ridgel   Any Other Special Instructions Will Be Listed Below (If Applicable).     If you need a refill on your cardiac medications before your next appointment, please call your pharmacy.

## 2016-10-20 NOTE — Progress Notes (Signed)
HPI Mr. Magri returns today for followup. He is a very pleasant 66 year old man with a h/o LV dysfunction, persistent atrial fib, chest pain with several normal or non-obstructive CAD caths. He c/o pain and weakness in his legs along with muscle aches. He denies chest pain. His dyspnea is much improved. No edema. No palpitations since his DCCV. He c/o not wanting to take so many meds.   No Known Allergies   Current Outpatient Prescriptions  Medication Sig Dispense Refill  . acetaminophen (TYLENOL) 325 MG tablet Take 2 tablets (650 mg total) by mouth every 4 (four) hours as needed for headache or mild pain.    Marland Kitchen apixaban (ELIQUIS) 5 MG TABS tablet Take 1 tablet (5 mg total) by mouth 2 (two) times daily. 60 tablet 6  . aspirin EC 81 MG EC tablet Take 1 tablet (81 mg total) by mouth daily.    Marland Kitchen atorvastatin (LIPITOR) 40 MG tablet Take 1 tablet (40 mg total) by mouth daily at 6 PM. 90 tablet 3  . b complex vitamins tablet Take 1 tablet by mouth daily.    . carvedilol (COREG) 25 MG tablet Take 25 mg by mouth 2 (two) times daily with a meal.    . diltiazem (CARDIZEM CD) 360 MG 24 hr capsule Take 1 capsule (360 mg total) by mouth daily. 30 capsule 5  . furosemide (LASIX) 40 MG tablet Take 1 tablet (40 mg total) by mouth daily. 30 tablet 11  . hydrALAZINE (APRESOLINE) 25 MG tablet Take 1.5 tablets (37.5 mg total) by mouth 2 (two) times daily. 90 tablet 6  . isosorbide dinitrate (ISORDIL) 20 MG tablet Take 1 tablet (20 mg total) by mouth 2 (two) times daily. 60 tablet 6  . isosorbide-hydrALAZINE (BIDIL) 20-37.5 MG tablet Take 1 tablet by mouth 2 (two) times daily.    Marland Kitchen latanoprost (XALATAN) 0.005 % ophthalmic solution Place 1 drop into both eyes at bedtime.  4  . lisinopril (PRINIVIL,ZESTRIL) 10 MG tablet Take 1 tablet (10 mg total) by mouth at bedtime.    . pantoprazole (PROTONIX) 40 MG tablet Take 1 tablet (40 mg total) by mouth 2 (two) times daily. 90 tablet 3  . polyethylene glycol  (MIRALAX / GLYCOLAX) packet Take 17 g by mouth daily as needed for moderate constipation.    . tamsulosin (FLOMAX) 0.4 MG CAPS capsule Take 1 capsule (0.4 mg total) by mouth at bedtime.     No current facility-administered medications for this visit.      Past Medical History:  Diagnosis Date  . Anemia    Microcytic   . Anxiety   . Asthma   . Atrial fibrillation (HCC)   . CHF (congestive heart failure) (HCC)   . Chronic upper back pain   . Coronary artery disease   . Depression   . Dysrhythmia   . Hypercholesteremia   . Hypertension   . Old myocardial infarct    /H&P 04/09/2012 (05/06/2012)  . SVT (supraventricular tachycardia) (HCC)     ROS:   All systems reviewed and negative except as noted in the HPI.   Past Surgical History:  Procedure Laterality Date  . CARDIAC CATHETERIZATION  04/08/2012  . CARDIOVERSION N/A 09/27/2013   Procedure: CARDIOVERSION;  Surgeon: Vesta Mixer, MD;  Location: [facility] ENDOSCOPY;  Service: Cardiovascular;  Laterality: N/A;  . CARDIOVERSION N/A 09/24/2016   Procedure: CARDIOVERSION;  Surgeon: [name], MD;  Location: [facility] ENDOSCOPY;  Service: Cardiovascular;  Laterality: N/A;  . EXCISIONAL  HEMORRHOIDECTOMY  ~ 2007  . LEFT AND RIGHT HEART CATHETERIZATION WITH CORONARY ANGIOGRAM N/A 09/28/2013   Procedure: LEFT AND RIGHT HEART CATHETERIZATION WITH CORONARY ANGIOGRAM;  Surgeon: Iran Ouch, MD;  Location: [facility] CATH LAB;  Service: Cardiovascular;  Laterality: N/A;  . LEFT HEART CATH AND CORONARY ANGIOGRAPHY N/A 09/23/2016   Procedure: Left Heart Cath and Coronary Angiography;  Surgeon: Corky Crafts, MD;  Location: [facility] INVASIVE CV LAB;  Service: Cardiovascular;  Laterality: N/A;  . LEFT HEART CATHETERIZATION WITH CORONARY ANGIOGRAM N/A 04/08/2012   Procedure: LEFT HEART CATHETERIZATION WITH CORONARY ANGIOGRAM;  Surgeon: Kathleene Hazel, MD;  Location: [facility] CATH LAB;  Service: Cardiovascular;  Laterality: N/A;  . SUPRAVENTRICULAR  TACHYCARDIA ABLATION  05/06/2012  . SUPRAVENTRICULAR TACHYCARDIA ABLATION N/A 05/06/2012   Procedure: SUPRAVENTRICULAR TACHYCARDIA ABLATION;  Surgeon: Marinus Maw, MD;  Location: [facility] CATH LAB;  Service: Cardiovascular;  Laterality: N/A;  . TEE WITHOUT CARDIOVERSION N/A 09/27/2013   Procedure: TRANSESOPHAGEAL ECHOCARDIOGRAM (TEE);  Surgeon: Vesta Mixer, MD;  Location: [facility] ENDOSCOPY;  Service: Cardiovascular;  Laterality: N/A;  . TEE WITHOUT CARDIOVERSION N/A 09/24/2016   Procedure: TRANSESOPHAGEAL ECHOCARDIOGRAM (TEE);  Surgeon: [name], MD;  Location: [facility] ENDOSCOPY;  Service: Cardiovascular;  Laterality: N/A;  . ULTRASOUND GUIDANCE FOR VASCULAR ACCESS  09/23/2016   Procedure: Ultrasound Guidance For Vascular Access;  Surgeon: Corky Crafts, MD;  Location: [facility] INVASIVE CV LAB;  Service: Cardiovascular;;     Family History  Problem Relation Age of Onset  . Diabetes Mother   . Stroke Father   . Cancer Sister   . Cancer Brother   . Cancer Brother   . Cancer Brother   . Cancer Sister   . Cancer Brother   . Heart disease Brother      Social History   Social History  . Marital status: Married    Spouse name: N/A  . Number of children: N/A  . Years of education: N/A   Occupational History  . Not on file.   Social History Main Topics  . Smoking status: Former Smoker    Years: 4.00    Types: Cigarettes  . Smokeless tobacco: Never Used     Comment: 05/06/2012 "quit in the 1990's; never smoked much"  . Alcohol use 1.2 oz/week    2 Cans of beer per week  . Drug use: Yes    Types: Marijuana     Comment: 05/06/2012 "when I was younger I probably  smoked pot"  . Sexual activity: Not Currently   Other Topics Concern  . Not on file   Social History Narrative  . No narrative on file     BP 110/78   Pulse 73   Ht 6\' 1"  (1.854 m)   Wt 187 lb (84.8 kg)   SpO2 98%   BMI 24.67 kg/m   Physical Exam:  Well appearing 66 year old man, NAD HEENT:  Unremarkable Neck:  6 cm JVD, no thyromegally Back:  No CVA tenderness Lungs:  Clear with no wheezes, rales, or rhonchi. HEART:  Regular rate rhythm, no murmurs, no rubs, no clicks, soft s4 Abd:  soft, positive bowel sounds, no organomegally, no rebound, no guarding Ext:  2 plus pulses, no edema, no cyanosis, no clubbing Skin:  No rashes no nodules Neuro:  CN II through XII intact, motor grossly intact  Assess/Plan: 1. Typical chest pain - his symptoms are noted but he has had them for years and no ischemia on lexiscan myoview. He had a cath a  couple of months ago which showed severe LV dysfunction with no obstructive disease. 2. SVT - no evidence of recurrent arrhythmias 3. Chronic systolic heart failure - review of his old data reveals prior severe LV dysfunction. His symptoms are improved now that he is in NSR. 4. HTN - His blood pressure is good today. He will stop his cardizem.  5. Atrial fib - he is maintaining NSR. He will continue his Eliquis.  Leonia Reeves.D.

## 2016-10-30 ENCOUNTER — Other Ambulatory Visit: Payer: Self-pay | Admitting: Internal Medicine

## 2016-10-30 DIAGNOSIS — I10 Essential (primary) hypertension: Secondary | ICD-10-CM

## 2016-11-18 ENCOUNTER — Other Ambulatory Visit: Payer: Self-pay

## 2016-11-18 MED ORDER — FUROSEMIDE 40 MG PO TABS: 40.0000 mg | ORAL_TABLET | Freq: Every day | ORAL | 3 refills | Status: DC

## 2016-11-18 MED ORDER — PANTOPRAZOLE SODIUM 40 MG PO TBEC
40.0000 mg | DELAYED_RELEASE_TABLET | Freq: Two times a day (BID) | ORAL | 3 refills | Status: DC
Start: 2016-11-18 — End: 2017-10-27

## 2016-11-18 MED ORDER — ISOSORBIDE DINITRATE 20 MG PO TABS: 20.0000 mg | ORAL_TABLET | Freq: Two times a day (BID) | ORAL | 3 refills | Status: DC

## 2016-11-18 MED ORDER — LISINOPRIL 10 MG PO TABS: 10.0000 mg | ORAL_TABLET | Freq: Every day | ORAL | 3 refills | Status: DC

## 2016-11-18 MED ORDER — CARVEDILOL 25 MG PO TABS: 25.0000 mg | ORAL_TABLET | Freq: Two times a day (BID) | ORAL | 3 refills | Status: DC

## 2016-11-18 MED ORDER — HYDRALAZINE HCL 25 MG PO TABS: 37.5000 mg | ORAL_TABLET | Freq: Two times a day (BID) | ORAL | 3 refills | Status: DC

## 2016-11-25 ENCOUNTER — Other Ambulatory Visit: Payer: Self-pay | Admitting: *Deleted

## 2016-11-25 MED ORDER — APIXABAN 5 MG PO TABS: 5.0000 mg | ORAL_TABLET | Freq: Two times a day (BID) | ORAL | 3 refills | Status: DC

## 2016-11-25 NOTE — Telephone Encounter (Signed)
Pt last saw Dr Ladona Ridgel 10/20/16, last labs 09/23/16 Creat 1.00, age 66, weight 84.8kg, based on specified criteria pt is on appropriate dosage of Eliquis 5mg  BID.  Will refill rx.

## 2016-12-22 ENCOUNTER — Ambulatory Visit (HOSPITAL_COMMUNITY): Payer: Medicare Other | Attending: Internal Medicine

## 2016-12-31 ENCOUNTER — Encounter: Payer: Self-pay | Admitting: Internal Medicine

## 2017-01-02 ENCOUNTER — Ambulatory Visit (HOSPITAL_COMMUNITY): Payer: Medicare Other | Attending: Cardiology

## 2017-01-02 ENCOUNTER — Other Ambulatory Visit: Payer: Self-pay

## 2017-01-02 DIAGNOSIS — Z87891 Personal history of nicotine dependence: Secondary | ICD-10-CM | POA: Diagnosis not present

## 2017-01-02 DIAGNOSIS — I2510 Atherosclerotic heart disease of native coronary artery without angina pectoris: Secondary | ICD-10-CM | POA: Insufficient documentation

## 2017-01-02 DIAGNOSIS — D649 Anemia, unspecified: Secondary | ICD-10-CM | POA: Diagnosis not present

## 2017-01-02 DIAGNOSIS — E785 Hyperlipidemia, unspecified: Secondary | ICD-10-CM | POA: Insufficient documentation

## 2017-01-02 DIAGNOSIS — I252 Old myocardial infarction: Secondary | ICD-10-CM | POA: Diagnosis not present

## 2017-01-02 DIAGNOSIS — I471 Supraventricular tachycardia: Secondary | ICD-10-CM | POA: Insufficient documentation

## 2017-01-02 DIAGNOSIS — I422 Other hypertrophic cardiomyopathy: Secondary | ICD-10-CM | POA: Diagnosis not present

## 2017-01-02 DIAGNOSIS — I428 Other cardiomyopathies: Secondary | ICD-10-CM | POA: Insufficient documentation

## 2017-01-02 DIAGNOSIS — I110 Hypertensive heart disease with heart failure: Secondary | ICD-10-CM | POA: Diagnosis not present

## 2017-01-02 DIAGNOSIS — I4891 Unspecified atrial fibrillation: Secondary | ICD-10-CM | POA: Insufficient documentation

## 2017-01-02 DIAGNOSIS — I5022 Chronic systolic (congestive) heart failure: Secondary | ICD-10-CM | POA: Diagnosis not present

## 2017-01-02 DIAGNOSIS — I519 Heart disease, unspecified: Secondary | ICD-10-CM | POA: Diagnosis not present

## 2017-01-23 ENCOUNTER — Ambulatory Visit (INDEPENDENT_AMBULATORY_CARE_PROVIDER_SITE_OTHER): Payer: Medicare Other | Admitting: Internal Medicine

## 2017-01-23 ENCOUNTER — Telehealth: Payer: Self-pay

## 2017-01-23 ENCOUNTER — Encounter: Payer: Self-pay | Admitting: Internal Medicine

## 2017-01-23 VITALS — BP 164/100 | HR 75 | Ht 74.0 in | Wt 185.2 lb

## 2017-01-23 DIAGNOSIS — I519 Heart disease, unspecified: Secondary | ICD-10-CM

## 2017-01-23 DIAGNOSIS — I480 Paroxysmal atrial fibrillation: Secondary | ICD-10-CM

## 2017-01-23 DIAGNOSIS — I5022 Chronic systolic (congestive) heart failure: Secondary | ICD-10-CM | POA: Diagnosis not present

## 2017-01-23 MED ORDER — FUROSEMIDE 40 MG PO TABS: 40.0000 mg | ORAL_TABLET | ORAL | 3 refills | Status: AC

## 2017-01-23 NOTE — Progress Notes (Signed)
HPI Mr. Kennan returns today for ongoing evaluation and management of his cardiomyopathy, atrial fibrillation, chest pain, with no obstructive coronary disease, and difficult to control hypertension. The patient underwent left heart catheterization approximate 4 months ago where he was found to have nonobstructive coronary disease. His ejection fraction has been 40%. The patient in the interim has done well except for elevations of his blood pressure. He wants to know if he can reduce some of his medications. He denies syncope. No peripheral edema. No Known Allergies   Current Outpatient Prescriptions  Medication Sig Dispense Refill  . acetaminophen (TYLENOL) 325 MG tablet Take 2 tablets (650 mg total) by mouth every 4 (four) hours as needed for headache or mild pain.    Marland Kitchen apixaban (ELIQUIS) 5 MG TABS tablet Take 1 tablet (5 mg total) by mouth 2 (two) times daily. 180 tablet 3  . aspirin EC 81 MG EC tablet Take 1 tablet (81 mg total) by mouth daily.    Marland Kitchen b complex vitamins tablet Take 1 tablet by mouth daily.    . carvedilol (COREG) 25 MG tablet Take 1 tablet (25 mg total) by mouth 2 (two) times daily with a meal. 180 tablet 3  . hydrALAZINE (APRESOLINE) 25 MG tablet Take 1.5 tablets (37.5 mg total) by mouth 2 (two) times daily. 270 tablet 3  . isosorbide dinitrate (ISORDIL) 20 MG tablet Take 1 tablet (20 mg total) by mouth 2 (two) times daily. 180 tablet 3  . latanoprost (XALATAN) 0.005 % ophthalmic solution Place 1 drop into both eyes at bedtime.  4  . lisinopril (PRINIVIL,ZESTRIL) 10 MG tablet Take 1 tablet (10 mg total) by mouth at bedtime. 90 tablet 3  . pantoprazole (PROTONIX) 40 MG tablet Take 1 tablet (40 mg total) by mouth 2 (two) times daily. 180 tablet 3  . polyethylene glycol (MIRALAX / GLYCOLAX) packet Take 17 g by mouth daily as needed for moderate constipation.    . ranitidine (ZANTAC) 300 MG tablet Take 300 mg by mouth daily.    . sucralfate (CARAFATE) 1 g tablet Take  1 g by mouth 2 (two) times daily.     . tamsulosin (FLOMAX) 0.4 MG CAPS capsule Take 1 capsule (0.4 mg total) by mouth at bedtime.    . furosemide (LASIX) 40 MG tablet Take 1 tablet (40 mg total) by mouth every other day. As needed.  If edema/swelling/weight increases go back to 40 mg by mouth daily. 90 tablet 3   No current facility-administered medications for this visit.      Past Medical History:  Diagnosis Date  . Anemia    Microcytic   . Anxiety   . Asthma   . Atrial fibrillation (HCC)   . CHF (congestive heart failure) (HCC)   . Chronic upper back pain   . Coronary artery disease   . Depression   . Dysrhythmia   . Hypercholesteremia   . Hypertension   . Old myocardial infarct    /H&P 04/09/2012 (05/06/2012)  . SVT (supraventricular tachycardia) (HCC)     ROS:   All systems reviewed and negative except as noted in the HPI.   Past Surgical History:  Procedure Laterality Date  . CARDIAC CATHETERIZATION  04/08/2012  . CARDIOVERSION N/A 09/27/2013   Procedure: CARDIOVERSION;  Surgeon: Vesta Mixer, MD;  Location: [facility] ENDOSCOPY;  Service: Cardiovascular;  Laterality: N/A;  . CARDIOVERSION N/A 09/24/2016   Procedure: CARDIOVERSION;  Surgeon: [name], MD;  Location: [facility] ENDOSCOPY;  Service: Cardiovascular;  Laterality: N/A;  . EXCISIONAL HEMORRHOIDECTOMY  ~ 2007  . LEFT AND RIGHT HEART CATHETERIZATION WITH CORONARY ANGIOGRAM N/A 09/28/2013   Procedure: LEFT AND RIGHT HEART CATHETERIZATION WITH CORONARY ANGIOGRAM;  Surgeon: Iran Ouch, MD;  Location: [facility] CATH LAB;  Service: Cardiovascular;  Laterality: N/A;  . LEFT HEART CATH AND CORONARY ANGIOGRAPHY N/A 09/23/2016   Procedure: Left Heart Cath and Coronary Angiography;  Surgeon: Corky Crafts, MD;  Location: [facility] INVASIVE CV LAB;  Service: Cardiovascular;  Laterality: N/A;  . LEFT HEART CATHETERIZATION WITH CORONARY ANGIOGRAM N/A 04/08/2012   Procedure: LEFT HEART CATHETERIZATION WITH CORONARY ANGIOGRAM;   Surgeon: Kathleene Hazel, MD;  Location: [facility] CATH LAB;  Service: Cardiovascular;  Laterality: N/A;  . SUPRAVENTRICULAR TACHYCARDIA ABLATION  05/06/2012  . SUPRAVENTRICULAR TACHYCARDIA ABLATION N/A 05/06/2012   Procedure: SUPRAVENTRICULAR TACHYCARDIA ABLATION;  Surgeon: Marinus Maw, MD;  Location: [facility] CATH LAB;  Service: Cardiovascular;  Laterality: N/A;  . TEE WITHOUT CARDIOVERSION N/A 09/27/2013   Procedure: TRANSESOPHAGEAL ECHOCARDIOGRAM (TEE);  Surgeon: Vesta Mixer, MD;  Location: [facility] ENDOSCOPY;  Service: Cardiovascular;  Laterality: N/A;  . TEE WITHOUT CARDIOVERSION N/A 09/24/2016   Procedure: TRANSESOPHAGEAL ECHOCARDIOGRAM (TEE);  Surgeon: [name], MD;  Location: [facility] ENDOSCOPY;  Service: Cardiovascular;  Laterality: N/A;  . ULTRASOUND GUIDANCE FOR VASCULAR ACCESS  09/23/2016   Procedure: Ultrasound Guidance For Vascular Access;  Surgeon: Corky Crafts, MD;  Location: [facility] INVASIVE CV LAB;  Service: Cardiovascular;;     Family History  Problem Relation Age of Onset  . Diabetes Mother   . Stroke Father   . Cancer Sister   . Cancer Brother   . Cancer Brother   . Cancer Brother   . Cancer Sister   . Cancer Brother   . Heart disease Brother      Social History   Social History  . Marital status: Married    Spouse name: N/A  . Number of children: N/A  . Years of education: N/A   Occupational History  . Not on file.   Social History Main Topics  . Smoking status: Former Smoker    Years: 4.00    Types: Cigarettes  . Smokeless tobacco: Never Used     Comment: 05/06/2012 "quit in the 1990's; never smoked much"  . Alcohol use 1.2 oz/week    2 Cans of beer per week  . Drug use: Yes    Types: Marijuana     Comment: 05/06/2012 "when I was younger I probably  smoked pot"  . Sexual activity: Not Currently   Other Topics Concern  . Not on file   Social History Narrative  . No narrative on file     BP (!) 164/100   Pulse 75   Ht 6\' 2"  (1.88 m)   Wt  185 lb 3.2 oz (84 kg)   BMI 23.78 kg/m   Physical Exam:  Well appearing middle-aged man, NAD HEENT: Unremarkable Neck:  7 cm JVD, no thyromegally Lymphatics:  No adenopathy Back:  No CVA tenderness Lungs:  Clear, with no wheezes, rales, or rhonchi HEART:  Regular rate rhythm with a soft S4, no murmurs, no rubs, no clicks Abd:  soft, positive bowel sounds, no organomegally, no rebound, no guarding Ext:  2 plus pulses, no edema, no cyanosis, no clubbing Skin:  No rashes no nodules Neuro:  CN II through XII intact, motor grossly intact  EKG - normal sinus rhythm with left ventricular hypertrophy  Assess/Plan: 1. Uncontrolled hypertension -  his pressures are better at home. He admits to dietary indiscretion. He is on 4 blood pressure lowering medications. He states that at home his pressures are better. He denies medical noncompliance. 2. Chronic systolic heart failure - his most recent EF is 35-40%. He will continue his current medications. I've asked the patient to reduce his salt intake and to exercise at least 30 minutes every day. I did tell him that he might reduce his dose of Lasix if he will weigh himself daily. If his weight goes up, he will need to take his Lasix daily until it goes back down. 3. Chest pain - his symptoms are currently well-controlled. They are not due to coronary disease as he has only nonobstructive CAD. 4. Paroxysmal atrial fibrillation - he is maintaining sinus rhythm very nicely. I've asked the patient to continue his oral anticoagulants, but stop aspirin. No indication for antiarrhythmic drug therapy at this time.  [name], M.D.

## 2017-01-23 NOTE — Telephone Encounter (Signed)
Call received from Pt.  Per Pt Dr. Ladona Ridgel had told him he can't eat any meat, he asks if this applies to steak.  This nurse told Pt that he needs to control the salt in his diet, and processed meats like bacon, sausage, sandwich meat have salt added.  Pt could have lean cuts of meat like steak, pork chops.  Notified Pt not to add any salt to the meats, to season with black pepper or garlic powder.  Pt indicates understanding.

## 2017-01-23 NOTE — Patient Instructions (Signed)
Medication Instructions:  Your physician has recommended you make the following change in your medication:  1.  Furosemide one tablet (40 mg) by mouth every other day as needed.  If swelling, edema or weight increases change to furosemide 40 mg (one tablet) by mouth daily.  Labwork: None ordered.  Testing/Procedures: None ordered.  Follow-Up: Your physician wants you to follow-up in: one year with Dr. Ladona Ridgel.    You will receive a reminder letter in the mail two months in advance. If you don't receive a letter, please call our office to schedule the follow-up appointment.   Any Other Special Instructions Will Be Listed Below (If Applicable).     If you need a refill on your cardiac medications before your next appointment, please call your pharmacy.

## 2017-03-09 ENCOUNTER — Telehealth: Payer: Self-pay | Admitting: Internal Medicine

## 2017-03-09 NOTE — Telephone Encounter (Signed)
Walk in pt Form-Pt would like to Speak with Nurse about meds. Placed in Doc Box.

## 2017-03-09 NOTE — Telephone Encounter (Signed)
Pt wife calling asking for a discount card for Eliquis 5 mg tablet, because pt is in the donut hole. I explained to the wife because pt has Medicare, pt will not be able to use the discount card. I informed the wife that I would leave patient assistance forms and 2 weeks supply of Eliquis 5 mg tablet for pt at the front desk for pt to pick up. I asked the wife for pt to return form back to our office ASAP and if pt has any other problems, questions or concerns to call the office. Pt's wife verbalized understanding.

## 2017-03-23 ENCOUNTER — Telehealth: Payer: Self-pay | Admitting: Internal Medicine

## 2017-03-23 MED ORDER — APIXABAN 5 MG PO TABS: 5.0000 mg | ORAL_TABLET | Freq: Two times a day (BID) | ORAL | 3 refills | Status: DC

## 2017-03-23 NOTE — Telephone Encounter (Signed)
**Note De-Identified Kel Senn Obfuscation** I have completed the providers part of the application and printed an Eliquis RX. I have placed both in Dr Bruna Potter mail bin awaiting his signature.

## 2017-03-23 NOTE — Telephone Encounter (Signed)
**Note De-Identified Cody Brewer Obfuscation** The pts wife is advised that I have left the pt 2 boxes of Eliquis samples in our front office and that they may pick up at their convenience.  Also, the pts wife states that they are waiting on the pts out of pocket expense report from his pharmacy and that once they get it they will return his application with the needed documents to this office so we can fax it all in together.

## 2017-03-23 NOTE — Telephone Encounter (Signed)
New message     Pt c/o medication issue:  1. Name of Medication:  eliquis   2. How are you currently taking this medication (dosage and times per day)?  1 tab 2x a day   3. Are you having a reaction (difficulty breathing--STAT)?  no  4. What is your medication issue?  They are waiting to get application for bristol myers to get reduced medication , they have been waiting for 2 weeks ,  Patient wants to know if he should continue not taking medication or is there something that you can do ?

## 2017-03-26 NOTE — Telephone Encounter (Signed)
Follow up  Pt wife is calling for assistance from rn to get an invoice for pt Eliquis

## 2017-03-27 NOTE — Telephone Encounter (Signed)
**Note De-Identified [name]** The pt gave permission over the phone for me to talk with his wife Cody Brewer concerning his care.  Cody Brewer states that she called [facility] to get the pts out of pocket expense report as she has been waiting on them to mail it to them for a while and was advised that it can take up to 6 weeks to get that information to them. [facility] recommended that she go online and register with [facility] and then she would be able to print that report herself. Cody Brewer states that she does not have a computer and that she tried to register using her cell phone but was having a lot difficulties with that.  I advised her that I will leave the pt some Eliquis samples in our front office for them to pick up and that as soon as she gets the out of pocket expense report to bring it along with the completed pt assistance application and proof of income to me. She verbalized understanding and thanked me for my assistance.

## 2017-03-27 NOTE — Telephone Encounter (Signed)
**Note De-identified Cody Brewer Obfuscation** LMTCB

## 2017-03-31 NOTE — Telephone Encounter (Signed)
**Note De-Identified Bobbyjo Marulanda Obfuscation** Dr Ladona Ridgel has signed the provider part of the [employer] pt assistance application and Eliquis RX. I have placed both in the yellow folder awaiting the pts part.

## 2017-04-01 ENCOUNTER — Telehealth: Payer: Self-pay | Admitting: Internal Medicine

## 2017-04-01 NOTE — Telephone Encounter (Signed)
Walk in pt Form-sealed envelope dropped off. Placed in Dr.Taylor Doc Box.

## 2017-04-06 NOTE — Telephone Encounter (Signed)
The pt returned his part of his [employer] pt assistance application, proof of income and out of pocket expense report from his pharmacy. I have faxed all to [employer] pt assistance foundation.

## 2017-04-14 NOTE — Telephone Encounter (Signed)
Approval on Eliquis pt assistance received from [employer] via fax. Approval is good from 04/13/2017 until 05/18/2017.

## 2017-10-15 ENCOUNTER — Other Ambulatory Visit: Payer: Self-pay | Admitting: Internal Medicine

## 2017-10-21 ENCOUNTER — Telehealth: Payer: Self-pay | Admitting: Internal Medicine

## 2017-10-21 NOTE — Telephone Encounter (Signed)
New Message      [facility] Pre-operative Risk Assessment    Request for surgical clearance:  1. What type of surgery is being performed?  thulium laser of the prostate  2. When is this surgery scheduled? TBD  3. What type of clearance is required (medical clearance vs. Pharmacy clearance to hold med vs. Both)? Both  4. Are there any medications that need to be held prior to surgery and how long? Eliquis/ 2-3 days off prior to surgery  5. Practice name and name of physician performing surgery? [facility]/ Dr. Festus Aloe  6. What is your office phone number [phone] ext [phone]   7.   What is your office fax number [phone]  8.   Anesthesia type (None, local, MAC, general) ? General   Cody Brewer 10/21/2017, 10:36 AM  _________________________________________________________________   (provider comments below)

## 2017-10-21 NOTE — Telephone Encounter (Signed)
   Primary Cardiologist: [name], MD  Chart reviewed as part of pre-operative protocol coverage. Patient was contacted 10/21/2017 in reference to pre-operative risk assessment for pending surgery as outlined below.  Cody Brewer was last seen 01/2017 by Dr. Ladona Ridgel. H/o NICM, atrial fib, HTN, anemia, anxiety, asthma, HLD, SVT. Will forward to pharm for input then will need to call pt to discuss clearance.  Laurann Montana, PA-C 10/21/2017, 4:56 PM

## 2017-10-22 NOTE — Telephone Encounter (Signed)
   Primary Cardiologist: [name], MD  Chart reviewed as part of pre-operative protocol coverage. Spoke with patient - no interim changes since last OV. Still has infrequent indigestion but cath 2018 reassuring, no worsening. No SOB, edema or syncope. Given past medical history and time since last visit, based on ACC/AHA guidelines, Cody Brewer would be at acceptable risk for the planned procedure without further cardiovascular testing.   PharmD reviewed chart and states, "Per office protocol, patient can hold Eliquis for 2 days prior to procedure."  I will route this recommendation to the requesting party via Epic fax function and remove from pre-op pool.  Please call with questions.  Laurann Montana, PA-C 10/22/2017, 2:03 PM

## 2017-10-22 NOTE — Telephone Encounter (Signed)
Patient with diagnosis of atrial fibrillation on Eliquis for anticoagulation.    Procedure: thulium laser of prostate Date of procedure: TBD  CHADS2-VASc score of  4 (CHF, HTN, AGE,  CAD, )  CrCl 95.9 Platelet count 239  Per office protocol, patient can hold Eliquis for 2 days prior to procedure.

## 2017-10-26 ENCOUNTER — Other Ambulatory Visit: Payer: Self-pay | Admitting: Urology

## 2017-10-27 ENCOUNTER — Encounter (HOSPITAL_BASED_OUTPATIENT_CLINIC_OR_DEPARTMENT_OTHER): Payer: Self-pay | Admitting: *Deleted

## 2017-10-27 ENCOUNTER — Other Ambulatory Visit: Payer: Self-pay

## 2017-10-27 NOTE — Progress Notes (Addendum)
Spoke w/ pt via phone for pre-op interview.  Npo after mn.  Arrive at [employer].  Needs istat 8.  Current ekg in chart and epic.  Will take coreg, zantac, hydralazine, and isosorbide am dos w/ sips of water.  Cardiac lov note , dated 01-23-2017, and telephone clearance , dated 10-21-2017, in chart and epic.   ADDENDUM:  REVIEWED CHART W/ DR ROSE MDA, FACE TO FACE,  STATED OK TO PROCEED.

## 2017-10-29 NOTE — H&P (Signed)
Office Visit Report     10/15/2017   --------------------------------------------------------------------------------   Cody Brewer  MRN: [id number]  PRIMARY CARE:    DOB: [birth year], 67 year old Male  REFERRING:  C Duane Lope, MD  SSN:   PROVIDER:  Jerilee Field, M.D.    LOCATION:  [facility] [phone]   --------------------------------------------------------------------------------   CC: I have symptoms of an enlarged prostate.  HPI: Cody Brewer is a 67 year-old male established patient who is here for symptoms of enlarged prostate.  He first noticed the symptoms approximately 09/16/2013. His symptoms have gotten worse over the last year. He has been treated with Flomax.   The patient was referred in 2015 for an episode of gross hematuria, frequency and urgency. His PSA was 1.44 and he was put on tamsulosin at that time. He continues tamsulosin and has weak stream and frequency. PVR 390 ml. AUASS = 26, unhappy. No recent gross hematuria.   No NG risk. He took Viagra in the past. He is on eliquis for a fib. He has neurofibromas of skin.   He returns for cystoscopy. CT order must have dropped off last OV. Ordered again. Done for h/o gross hematuria and severe LUTS. PVR 242 today.     CC: I have a prostate nodule.  HPI: His prostate nodule was discovered 09/28/2017. He has not had a prostate biopsy done. He has had a PSA done. His PSA has always been normal.   May 2019 - 5 mm right mid prostate nodule. PSA was 2.58.    ALLERGIES: None   MEDICATIONS: Lisinopril 10 mg tablet  Tamsulosin Hcl 0.4 mg capsule  Carvedilol 25 mg tablet  Eliquis 5 mg tablet  Furosemide 40 mg tablet  Hydralazine Hcl 25 mg tablet  Isosorbide Dinitrate 20 mg tablet  Latanoprost 0.005 % drops  Miralax  Ranitidine Hcl 300 mg tablet  Vitamin B-12     GU PSH: None     PSH Notes: cardiac procedure for AFIB-2016   NON-GU PSH: Cataract surgery, Bilateral    GU PMH:  BPH w/LUTS - 09/28/2017 Gross hematuria - 09/28/2017 Prostate nodule w/ LUTS - 09/28/2017 Urinary Frequency - 09/28/2017 Weak Urinary Stream - 09/28/2017    NON-GU PMH: Asthma Atrial Fibrillation Encounter for general adult medical examination without abnormal findings, Encounter for preventive health examination GERD Glaucoma Hypercholesterolemia Hypertension Myocardial Infarction Other heart failure    FAMILY HISTORY: Death of family member - Father, Mother Diabetes - Mother stroke - Father    Notes: 1 son; 1 daughter   SOCIAL HISTORY: Marital Status: Married Preferred Language: English; Ethnicity: Not Hispanic Or Latino; Race: Black or African American Current Smoking Status: Patient does not smoke anymore. Has not smoked since 09/17/1987. Smoked for 5 years. Smoked 1 pack per day.   Tobacco Use Assessment Completed: Used Tobacco in last 30 days? Does not use smokeless tobacco. Has never drank.  Drinks 2 caffeinated drinks per day. Patient's occupation is/was retired.    REVIEW OF SYSTEMS:    GU Review Male:   Patient denies frequent urination, hard to postpone urination, burning/ pain with urination, get up at night to urinate, leakage of urine, stream starts and stops, trouble starting your stream, have to strain to urinate , erection problems, and penile pain.  Gastrointestinal (Upper):   Patient denies nausea, vomiting, and indigestion/ heartburn.  Gastrointestinal (Lower):   Patient reports constipation. Patient denies diarrhea.  Constitutional:   Patient reports night sweats. Patient denies fever, weight loss,  and fatigue.  Skin:   Patient denies skin rash/ lesion and itching.  Eyes:   Patient reports blurred vision. Patient denies double vision.  Ears/ Nose/ Throat:   Patient denies sore throat and sinus problems.  Hematologic/Lymphatic:   Patient denies swollen glands and easy bruising.  Cardiovascular:   Patient denies leg swelling and chest pains.  Respiratory:    Patient denies cough and shortness of breath.  Endocrine:   Patient reports excessive thirst.   Musculoskeletal:   Patient denies back pain and joint pain.  Neurological:   Patient denies headaches and dizziness.  Psychologic:   Patient denies anxiety and depression.   VITAL SIGNS:      10/15/2017 09:48 AM  Weight 187 lb / 84.82 kg  Height 73 in / 185.42 cm  BP 174/103 mmHg  Pulse 63 /min  Temperature 97.8 F / 36.5 C  BMI 24.7 kg/m   GU PHYSICAL EXAMINATION:    Scrotum: No lesions. No edema. No cysts. No warts.  Testes: No tenderness, no swelling, no enlargement left testes. No tenderness, no swelling, no enlargement right testes. Normal location left testes. Normal location right testes. No mass, no cyst, no varicocele, no hydrocele left testes. No mass, no cyst, no varicocele, no hydrocele right testes.  Urethral Meatus: Normal size. No lesion, no wart, no discharge, no polyp. Normal location.   MULTI-SYSTEM PHYSICAL EXAMINATION:    Constitutional: Well-nourished. No physical deformities. Normally developed. Good grooming.  Neck: Neck symmetrical, not swollen. Normal tracheal position.  Respiratory: No labored breathing, no use of accessory muscles. Normal breath sounds.  Cardiovascular: Normal temperature, normal extremity pulses, no swelling, no varicosities.   Skin: No paleness, no jaundice, no cyanosis. No lesion, no ulcer, no rash.  Neurologic / Psychiatric: Oriented to time, oriented to place, oriented to person. No depression, no anxiety, no agitation.  Gastrointestinal: No mass, no tenderness, no rigidity, non obese abdomen.     PAST DATA REVIEWED:  Source Of History:  Patient   09/28/17  PSA  Total PSA 2.58 ng/mL    PROCEDURES:         Flexible Cystoscopy - 52000  Risks, benefits, and some of the potential complications of the procedure were discussed with the patient. All questions were answered. Informed consent was obtained. Antibiotic prophylaxis was given --  Cephalexin. Sterile technique and intraurethral analgesia were used.  Meatus:  Normal size. Normal location. Normal condition.  Urethra:  No strictures.  External Sphincter:  Normal.  Verumontanum:  Normal.  Prostate:  Obstructing. Moderate hyperplasia.  Bladder Neck:  Non-obstructing.  Ureteral Orifices:  Normal location. Normal size. Normal shape. Effluxed clear urine.  Bladder:  Moderate trabeculation. Many large stones. No tumors. Normal mucosa.      The lower urinary tract was carefully examined. The procedure was well-tolerated and without complications. Antibiotic instructions were given. Instructions were given to call the office immediately for bloody urine, difficulty urinating, painful urination, fever, chills, nausea, vomiting or other illness. The patient stated that he understood these instructions and would comply with them.          PVR Ultrasound - 75797  Scanned Volume: 242 cc         Urinalysis - 81003 Dipstick Dipstick Cont'd  Color: Yellow Bilirubin: Neg  Appearance: Clear Ketones: Neg  Specific Gravity: 1.025 Blood: Neg  pH: 6.0 Protein: Trace  Glucose: Neg Urobilinogen: 0.2    Nitrites: Neg    Leukocyte Esterase: Neg    ASSESSMENT:  ICD-10 Details  1 GU:   BPH w/LUTS - N40.1   2   Prostate nodule w/o LUTS - N40.2   3   Weak Urinary Stream - R39.12   4   Bladder Stone - N21.0    PLAN:           Orders         Schedule X-Rays: 1 Week - C.T. Hematuria With and Without I.V. Contrast  Return Visit/Planned Activity: Next Available Appointment - Schedule Surgery          Document Letter(s):  Created for Patient: Clinical Summary         Notes:   BPH, incomplete emptying, bladder stones-discussed with the patient and his wife the nature risks benefits and alternatives to cystoscopy with holmium laser of the prostate and the bladder stones. We may also need the holmium or conversion to TURP. We discussed risks such as incontinence, stricture,  bleeding and infection among others. All questions answered.   gross hematuria - likely from BPH and bladder stone. We'll complete tract screening with a CT.   prostate nodule - will monitor and repeat EUA.   cc: Dr. Tenny Craw         Next Appointment:      Next Appointment: 10/22/2017 09:45 AM    Appointment Type: CT Scan    Location: [facility] [phone]    Provider: CT CT    Reason for Visit: ct Hematuria Protocol Amelia Macken Gross Hematuria      * Signed by Jerilee Field, M.D. on 10/15/17 at 6:33 PM (EDT)*     The information contained in this medical record document is considered private and confidential patient information. This information can only be used for the medical diagnosis and/or medical services that are being provided by the patient's selected caregivers. This information can only be distributed outside of the patient's care if the patient agrees and signs waivers of authorization for this information to be sent to an outside source or route.  ADD: His 10/22/2017 CT was benign.

## 2017-10-30 ENCOUNTER — Ambulatory Visit (HOSPITAL_BASED_OUTPATIENT_CLINIC_OR_DEPARTMENT_OTHER): Payer: Medicare Other | Admitting: Certified Registered Nurse Anesthetist

## 2017-10-30 ENCOUNTER — Other Ambulatory Visit: Payer: Self-pay

## 2017-10-30 ENCOUNTER — Encounter (HOSPITAL_BASED_OUTPATIENT_CLINIC_OR_DEPARTMENT_OTHER): Payer: Self-pay | Admitting: *Deleted

## 2017-10-30 ENCOUNTER — Ambulatory Visit (HOSPITAL_BASED_OUTPATIENT_CLINIC_OR_DEPARTMENT_OTHER)
Admission: RE | Admit: 2017-10-30 | Discharge: 2017-10-30 | Disposition: A | Discharge status: Home / Self Care | Payer: Medicare Other | Source: Ambulatory Visit | Attending: Urology | Admitting: Urology

## 2017-10-30 ENCOUNTER — Encounter (HOSPITAL_BASED_OUTPATIENT_CLINIC_OR_DEPARTMENT_OTHER)
Admission: RE | Disposition: A | Discharge status: Home / Self Care | Payer: Self-pay | Source: Ambulatory Visit | Attending: Urology

## 2017-10-30 DIAGNOSIS — K219 Gastro-esophageal reflux disease without esophagitis: Secondary | ICD-10-CM | POA: Insufficient documentation

## 2017-10-30 DIAGNOSIS — N210 Calculus in bladder: Secondary | ICD-10-CM | POA: Diagnosis not present

## 2017-10-30 DIAGNOSIS — N401 Enlarged prostate with lower urinary tract symptoms: Secondary | ICD-10-CM | POA: Insufficient documentation

## 2017-10-30 DIAGNOSIS — I509 Heart failure, unspecified: Secondary | ICD-10-CM | POA: Insufficient documentation

## 2017-10-30 DIAGNOSIS — Z7901 Long term (current) use of anticoagulants: Secondary | ICD-10-CM | POA: Insufficient documentation

## 2017-10-30 DIAGNOSIS — H409 Unspecified glaucoma: Secondary | ICD-10-CM | POA: Insufficient documentation

## 2017-10-30 DIAGNOSIS — I4891 Unspecified atrial fibrillation: Secondary | ICD-10-CM | POA: Insufficient documentation

## 2017-10-30 DIAGNOSIS — I10 Essential (primary) hypertension: Secondary | ICD-10-CM | POA: Insufficient documentation

## 2017-10-30 DIAGNOSIS — I252 Old myocardial infarction: Secondary | ICD-10-CM | POA: Insufficient documentation

## 2017-10-30 DIAGNOSIS — Z79899 Other long term (current) drug therapy: Secondary | ICD-10-CM | POA: Insufficient documentation

## 2017-10-30 DIAGNOSIS — R3912 Poor urinary stream: Secondary | ICD-10-CM | POA: Insufficient documentation

## 2017-10-30 DIAGNOSIS — I2510 Atherosclerotic heart disease of native coronary artery without angina pectoris: Secondary | ICD-10-CM | POA: Diagnosis not present

## 2017-10-30 DIAGNOSIS — J449 Chronic obstructive pulmonary disease, unspecified: Secondary | ICD-10-CM | POA: Diagnosis not present

## 2017-10-30 DIAGNOSIS — Z87891 Personal history of nicotine dependence: Secondary | ICD-10-CM | POA: Insufficient documentation

## 2017-10-30 DIAGNOSIS — N138 Other obstructive and reflux uropathy: Secondary | ICD-10-CM

## 2017-10-30 HISTORY — DX: Personal history of other diseases of the circulatory system: Z86.79

## 2017-10-30 HISTORY — DX: Calculus in bladder: N21.0

## 2017-10-30 HISTORY — PX: THULIUM LASER TURP (TRANSURETHRAL RESECTION OF PROSTATE): SHX6744

## 2017-10-30 HISTORY — DX: Neurofibromatosis, type 1: Q85.01

## 2017-10-30 HISTORY — DX: Unspecified glaucoma: H40.9

## 2017-10-30 HISTORY — DX: Other constipation: K59.09

## 2017-10-30 HISTORY — DX: Old myocardial infarction: I25.2

## 2017-10-30 HISTORY — DX: Complete loss of teeth, unspecified cause, unspecified class: K08.109

## 2017-10-30 HISTORY — DX: Paroxysmal atrial fibrillation: I48.0

## 2017-10-30 HISTORY — DX: Emphysema, unspecified: J43.9

## 2017-10-30 HISTORY — DX: Complete loss of teeth, unspecified cause, unspecified class: Z97.2

## 2017-10-30 HISTORY — PX: CYSTOSCOPY WITH LITHOLAPAXY: SHX1425

## 2017-10-30 HISTORY — DX: Long term (current) use of anticoagulants: Z79.01

## 2017-10-30 HISTORY — DX: Other cardiomyopathies: I42.8

## 2017-10-30 HISTORY — DX: Chronic systolic (congestive) heart failure: I50.22

## 2017-10-30 HISTORY — DX: Gastro-esophageal reflux disease without esophagitis: K21.9

## 2017-10-30 HISTORY — DX: Benign prostatic hyperplasia with lower urinary tract symptoms: N40.1

## 2017-10-30 HISTORY — DX: Localized edema: R60.0

## 2017-10-30 LAB — POCT I-STAT, CHEM 8
BUN: 23 mg/dL — ABNORMAL HIGH (ref 6–20)
Calcium, Ion: 1.25 mmol/L (ref 1.15–1.40)
Chloride: 105 mmol/L (ref 101–111)
Creatinine, Ser: 0.9 mg/dL (ref 0.61–1.24)
Glucose, Bld: 101 mg/dL — ABNORMAL HIGH (ref 65–99)
HEMATOCRIT: 42 % (ref 39.0–52.0)
HEMOGLOBIN: 14.3 g/dL (ref 13.0–17.0)
Potassium: 4.2 mmol/L (ref 3.5–5.1)
SODIUM: 141 mmol/L (ref 135–145)
TCO2: 26 mmol/L (ref 22–32)

## 2017-10-30 SURGERY — CYSTOSCOPY, WITH BLADDER CALCULUS LITHOLAPAXY
Anesthesia: General

## 2017-10-30 MED ORDER — CEPHALEXIN 500 MG PO CAPS: 500.0000 mg | ORAL_CAPSULE | Freq: Every day | ORAL | 0 refills | Status: AC

## 2017-10-30 MED ORDER — PHENYLEPHRINE 40 MCG/ML (10ML) SYRINGE FOR IV PUSH (FOR BLOOD PRESSURE SUPPORT)
PREFILLED_SYRINGE | INTRAVENOUS | Status: AC
  Filled 2017-10-30: qty 10

## 2017-10-30 MED ORDER — PROMETHAZINE HCL 25 MG/ML IJ SOLN
6.2500 mg | INTRAMUSCULAR | Status: DC | PRN
  Filled 2017-10-30: qty 1

## 2017-10-30 MED ORDER — FENTANYL CITRATE (PF) 100 MCG/2ML IJ SOLN
INTRAMUSCULAR | Status: DC | PRN
  Administered 2017-10-30 (×8): 25 ug via INTRAVENOUS

## 2017-10-30 MED ORDER — PROPOFOL 10 MG/ML IV BOLUS
INTRAVENOUS | Status: DC | PRN
  Administered 2017-10-30: 120 mg via INTRAVENOUS

## 2017-10-30 MED ORDER — SODIUM CHLORIDE 0.9 % IR SOLN
Status: DC | PRN
  Administered 2017-10-30 (×8): 3000 mL via INTRAVESICAL

## 2017-10-30 MED ORDER — APIXABAN 5 MG PO TABS: 5.0000 mg | ORAL_TABLET | Freq: Two times a day (BID) | ORAL | 3 refills | Status: AC

## 2017-10-30 MED ORDER — DEXAMETHASONE SODIUM PHOSPHATE 10 MG/ML IJ SOLN
INTRAMUSCULAR | Status: DC | PRN
  Administered 2017-10-30: 10 mg via INTRAVENOUS

## 2017-10-30 MED ORDER — ONDANSETRON HCL 4 MG/2ML IJ SOLN
INTRAMUSCULAR | Status: AC
  Filled 2017-10-30: qty 2

## 2017-10-30 MED ORDER — ACETAMINOPHEN 325 MG PO TABS
ORAL_TABLET | ORAL | Status: AC
  Filled 2017-10-30: qty 2

## 2017-10-30 MED ORDER — LIDOCAINE 2% (20 MG/ML) 5 ML SYRINGE
INTRAMUSCULAR | Status: DC | PRN
  Administered 2017-10-30: 80 mg via INTRAVENOUS

## 2017-10-30 MED ORDER — GLYCOPYRROLATE PF 0.2 MG/ML IJ SOSY
PREFILLED_SYRINGE | INTRAMUSCULAR | Status: DC | PRN
  Administered 2017-10-30: .2 mg via INTRAVENOUS

## 2017-10-30 MED ORDER — EPHEDRINE 5 MG/ML INJ
INTRAVENOUS | Status: AC
  Filled 2017-10-30: qty 10

## 2017-10-30 MED ORDER — LIDOCAINE 2% (20 MG/ML) 5 ML SYRINGE
INTRAMUSCULAR | Status: AC
  Filled 2017-10-30: qty 5

## 2017-10-30 MED ORDER — EPHEDRINE SULFATE-NACL 50-0.9 MG/10ML-% IV SOSY
PREFILLED_SYRINGE | INTRAVENOUS | Status: DC | PRN
  Administered 2017-10-30: 5 mg via INTRAVENOUS
  Administered 2017-10-30: 10 mg via INTRAVENOUS
  Administered 2017-10-30 (×3): 5 mg via INTRAVENOUS

## 2017-10-30 MED ORDER — MIDAZOLAM HCL 2 MG/2ML IJ SOLN
INTRAMUSCULAR | Status: AC
  Filled 2017-10-30: qty 2

## 2017-10-30 MED ORDER — BELLADONNA ALKALOIDS-OPIUM 16.2-60 MG RE SUPP
RECTAL | Status: DC | PRN
  Administered 2017-10-30: 1 via RECTAL

## 2017-10-30 MED ORDER — ONDANSETRON HCL 4 MG/2ML IJ SOLN
INTRAMUSCULAR | Status: DC | PRN
  Administered 2017-10-30: 4 mg via INTRAVENOUS

## 2017-10-30 MED ORDER — ACETAMINOPHEN 325 MG PO TABS
650.0000 mg | ORAL_TABLET | Freq: Once | ORAL | Status: DC
  Filled 2017-10-30: qty 2

## 2017-10-30 MED ORDER — MIDAZOLAM HCL 2 MG/2ML IJ SOLN
INTRAMUSCULAR | Status: DC | PRN
  Administered 2017-10-30 (×2): 1 mg via INTRAVENOUS

## 2017-10-30 MED ORDER — FENTANYL CITRATE (PF) 100 MCG/2ML IJ SOLN
INTRAMUSCULAR | Status: AC
  Filled 2017-10-30: qty 2

## 2017-10-30 MED ORDER — CEFAZOLIN SODIUM-DEXTROSE 2-4 GM/100ML-% IV SOLN
2.0000 g | Freq: Once | INTRAVENOUS | Status: AC
  Administered 2017-10-30: 2 g via INTRAVENOUS
  Filled 2017-10-30: qty 100

## 2017-10-30 MED ORDER — LACTATED RINGERS IV SOLN
INTRAVENOUS | Status: DC
  Administered 2017-10-30 (×2): via INTRAVENOUS
  Filled 2017-10-30: qty 1000

## 2017-10-30 MED ORDER — GLYCOPYRROLATE PF 0.2 MG/ML IJ SOSY
PREFILLED_SYRINGE | INTRAMUSCULAR | Status: AC
  Filled 2017-10-30: qty 1

## 2017-10-30 MED ORDER — ACETAMINOPHEN 325 MG PO TABS
650.0000 mg | ORAL_TABLET | Freq: Once | ORAL | Status: AC
Start: 2017-10-30 — End: 2017-10-30
  Administered 2017-10-30: 650 mg via ORAL
  Filled 2017-10-30: qty 2

## 2017-10-30 MED ORDER — LABETALOL HCL 5 MG/ML IV SOLN
INTRAVENOUS | Status: AC
  Filled 2017-10-30: qty 4

## 2017-10-30 MED ORDER — CEFAZOLIN SODIUM-DEXTROSE 1-4 GM/50ML-% IV SOLN
INTRAVENOUS | Status: AC
  Filled 2017-10-30: qty 50

## 2017-10-30 MED ORDER — PHENYLEPHRINE 40 MCG/ML (10ML) SYRINGE FOR IV PUSH (FOR BLOOD PRESSURE SUPPORT)
PREFILLED_SYRINGE | INTRAVENOUS | Status: DC | PRN
  Administered 2017-10-30 (×2): 80 ug via INTRAVENOUS
  Administered 2017-10-30 (×2): 40 ug via INTRAVENOUS

## 2017-10-30 MED ORDER — OXYCODONE HCL 5 MG/5ML PO SOLN
5.0000 mg | Freq: Once | ORAL | Status: DC | PRN
  Filled 2017-10-30: qty 5

## 2017-10-30 MED ORDER — FENTANYL CITRATE (PF) 100 MCG/2ML IJ SOLN
25.0000 ug | INTRAMUSCULAR | Status: DC | PRN
  Filled 2017-10-30: qty 1

## 2017-10-30 MED ORDER — DEXAMETHASONE SODIUM PHOSPHATE 10 MG/ML IJ SOLN
INTRAMUSCULAR | Status: AC
  Filled 2017-10-30: qty 1

## 2017-10-30 MED ORDER — LACTATED RINGERS IV SOLN
INTRAVENOUS | Status: DC
  Filled 2017-10-30: qty 1000

## 2017-10-30 MED ORDER — OXYCODONE HCL 5 MG PO TABS
5.0000 mg | ORAL_TABLET | Freq: Once | ORAL | Status: DC | PRN
  Filled 2017-10-30: qty 1

## 2017-10-30 MED ORDER — PROPOFOL 10 MG/ML IV BOLUS
INTRAVENOUS | Status: AC
  Filled 2017-10-30: qty 20

## 2017-10-30 MED ORDER — BELLADONNA ALKALOIDS-OPIUM 16.2-60 MG RE SUPP
RECTAL | Status: AC
  Filled 2017-10-30: qty 1

## 2017-10-30 MED ORDER — MEPERIDINE HCL 25 MG/ML IJ SOLN
6.2500 mg | INTRAMUSCULAR | Status: DC | PRN
  Filled 2017-10-30: qty 1

## 2017-10-30 MED ORDER — LABETALOL HCL 5 MG/ML IV SOLN
5.0000 mg | INTRAVENOUS | Status: AC | PRN
  Administered 2017-10-30 (×2): 5 mg via INTRAVENOUS
  Filled 2017-10-30: qty 4

## 2017-10-30 SURGICAL SUPPLY — 22 items
BAG DRAIN URO-CYSTO SKYTR STRL (DRAIN) ×3 IMPLANT
BAG URINE DRAINAGE (UROLOGICAL SUPPLIES) ×3 IMPLANT
CATH COUDE FOLEY 2W 5CC 18FR (CATHETERS) IMPLANT
CATH FOLEY 3WAY 30CC 22F (CATHETERS) IMPLANT
CLOTH BEACON ORANGE TIMEOUT ST (SAFETY) ×3 IMPLANT
CNTNR SPEC C3OZ STD GRAD LEK (MISCELLANEOUS) ×1 IMPLANT
CONT SPEC 3OZ W/LID STRL (MISCELLANEOUS) ×2
FIBER LASER FLEXIVA 1000 (UROLOGICAL SUPPLIES) ×3 IMPLANT
GLOVE BIO SURGEON STRL SZ7.5 (GLOVE) ×3 IMPLANT
GOWN STRL REUS W/TWL XL LVL3 (GOWN DISPOSABLE) ×3 IMPLANT
HOLDER FOLEY CATH W/STRAP (MISCELLANEOUS) ×3 IMPLANT
IV NS IRRIG 3000ML ARTHROMATIC (IV SOLUTION) ×3 IMPLANT
KIT TURNOVER CYSTO (KITS) ×3 IMPLANT
LASER REVOLIX PROCEDURE (MISCELLANEOUS) ×3 IMPLANT
LOOP CUT BIPOLAR 24F LRG (ELECTROSURGICAL) IMPLANT
MANIFOLD NEPTUNE II (INSTRUMENTS) ×3 IMPLANT
PACK CYSTO (CUSTOM PROCEDURE TRAY) ×3 IMPLANT
SYR 30ML LL (SYRINGE) IMPLANT
SYRINGE IRR TOOMEY STRL 70CC (SYRINGE) ×3 IMPLANT
TUBE CONNECTING 12'X1/4 (SUCTIONS)
TUBE CONNECTING 12X1/4 (SUCTIONS) IMPLANT
TUBING UROLOGY SET (TUBING) ×3 IMPLANT

## 2017-10-30 NOTE — Op Note (Addendum)
Preoperative diagnosis: BPH, weak stream, bladder stones Postoperative diagnosis: Same  Procedure: Cystoscopy with laser lithotripsy and irrigation of bladder stones (less than 2.5 cm calculus), thulium laser vaporization of the prostate  Surgeon: Mena Goes  Anesthesia: General  Indication for procedure: 67 year old African-American male with obstructive and irritative voiding symptoms found to have 20 or more small stones in the bladder and bladder outlet obstruction from prostatic hyperplasia.  He is brought today for the above procedures.  On cystoscopy there was a stricture of the meatus/fossa navicularis which required dilation, the remainder the urethra was normal, the prostatic urethra showed an elongated obstructing lateral lobes with a prominent median lobe and a high bladder neck.  The bladder was mildly trabeculated.  20+ stones in the bladder some of which were larger than the 28 French resectoscope sheath and would not drain.  About 6 of the stones required laser lithotripsy all the other ones I was able to irrigate.  Excellent channel at the end of laser vaporization of the prostate.  Description of procedure: After consent was obtained patient brought to the operating room.  After adequate anesthesia he is placed in lithotomy position and prepped and draped in the usual sterile fashion.  A timeout was performed to confirm the patient and procedure.  The cystoscope would not pass through the meatus and I dilated the meatus and fossa to 30 [place].  There was a stricture at the very distal portion of the urethra.  The scope was inserted into the bladder and the bladder inspected.  I then swapped the laser continuous flow sheath which is 42 [place] with a 28 French resectoscope sheath and irrigated many of the stones.  About 6 or 7 remained in the continuous flow laser sheath was repassed in the thousand micron laser fiber advance.  These remaining stones were fragmented and irrigated.  They  were quite hard.  Setting was 1-1.2 at rate of 10-8 respectively.  I have included all the stones I then passed the thulium laser and made incisions at 5:00 and 7:00 in line with the ureteral orifice ease down through the bladder neck and prostate and brought these incisions down to the veru.  The veru and ureteral orifice ease were checked throughout the case.  I vaporized the median lobe at 150 W and then brought the incisions up in the usual hockey-stick fashion at the apex and vaporized the right and the left lateral lobes.  This created an excellent channel.  At low pressure hemostasis was excellent.  Some residual median and lateral lobe tissue was vaporized.  Again a good channel and excellent hemostasis.  The bladder was filled and the scope removed and an 55 [place] coud catheter was placed.  16 cc was placed in the balloon.  He was seated at the bladder neck.  Irrigation was clear.  On rectal exam the prostate was palpably normal and I placed a B&O suppository.  The patient was awakened and taken to the recovery room in stable condition.  He remained stable throughout the case.  Complications: None  Blood loss: Minimal  Specimens: None  Drains: 51 French coud catheter with 16 cc in the balloon.  He will follow-up in 3 to 4 days for voiding trial.

## 2017-10-30 NOTE — Transfer of Care (Signed)
Immediate Anesthesia Transfer of Care Note  Patient: Cody Brewer  Procedure(s) Performed: CYSTOSCOPY WITH LITHOLAPAXY (N/A ) THULIUM LASER TURP (TRANSURETHRAL RESECTION OF PROSTATE) (N/A )  Patient Location: PACU  Anesthesia Type:General  Level of Consciousness: awake, alert , oriented, drowsy and patient cooperative  Airway & Oxygen Therapy: Patient Spontanous Breathing and Patient connected to nasal cannula oxygen  Post-op Assessment: Report given to RN and Post -op Vital signs reviewed and stable  Post vital signs: Reviewed and stable  Last Vitals:  Vitals Value Taken Time  BP 156/104 10/30/2017 10:30 AM  Temp    Pulse 80 10/30/2017 10:35 AM  Resp 10 10/30/2017 10:35 AM  SpO2 98 % 10/30/2017 10:35 AM  Vitals shown include unvalidated device data.  Last Pain:  Vitals:   10/30/17 0646  TempSrc:   PainSc: 0-No pain      Patients Stated Pain Goal: 4 (10/30/17 5573)  Complications: No apparent anesthesia complications

## 2017-10-30 NOTE — Interval H&P Note (Signed)
History and Physical Interval Note:  10/30/2017 8:13 AM  Cody Brewer  has presented today for surgery, with the diagnosis of BLADDER STONES, BENIGN PROSTATIC HYPERPLASIA  The various methods of treatment have been discussed with the patient and family. After consideration of risks, benefits and other options for treatment, the patient has consented to  Procedure(s) with comments: CYSTOSCOPY WITH LITHOLAPAXY (N/A) - ONLY NEEDS 120 MIN FOR PROCEDURES THULIUM LASER TURP (TRANSURETHRAL RESECTION OF PROSTATE) (N/A) as a surgical intervention.  The patient's history has been reviewed, patient examined, no change in status, stable for surgery. He continues to have bladder pain, hesitancy and weak stream. No fever or chills. No dysuria. He was HTN and had not taken his meds. He feels sleepy and sweaty after taking his AM meds here in [facility] with no food on his stomach.  I have reviewed the patient's chart and labs.  Questions were answered to the patient's satisfaction.     Jerilee Field

## 2017-10-30 NOTE — Anesthesia Preprocedure Evaluation (Signed)
Anesthesia Evaluation  Patient identified by MRN, date of birth, ID band Patient awake    Reviewed: Allergy & Precautions, H&P , NPO status , Patient's Chart, lab work & pertinent test results, reviewed documented beta blocker date and time   History of Anesthesia Complications Negative for: history of anesthetic complications  Airway Mallampati: II  TM Distance: >3 FB Neck ROM: Full    Dental  (+) Edentulous Upper, Edentulous Lower, Dental Advisory Given   Pulmonary asthma , COPD, former smoker,    Pulmonary exam normal        Cardiovascular hypertension, Pt. on medications and Pt. on home beta blockers + CAD ('13 cath: non-obstructive coronary disease), + Past MI and +CHF  + dysrhythmias Atrial Fibrillation and Supra Ventricular Tachycardia  Rhythm:Irregular Rate:Tachycardia  Echo 12/2016 - Left ventricle: The cavity size was mildly dilated. There was mild focal basal hypertrophy of the septum. Systolic function was normal. The estimated ejection fraction was in the range of 50% to 55%. Wall motion was normal; there were no regional wall motion abnormalities. Doppler parameters are consistent with abnormal left ventricular relaxation (grade 1 diastolic Dysfunction). - Aortic valve: There was trivial regurgitation. - Pulmonary arteries: PA peak pressure: 34 mm Hg (S). - Pericardium, extracardiac: A trivial pericardial effusion was  identified.  Impressions: - Normal LV systolic function; mild diastolic dysfunction; mild proximal septal thickening; mild LVE; trace AI.  09/23/2016 Cath Conclusion    Mid LAD lesion, 20 %stenosed.  Dist LAD lesion, 20 %stenosed.  The left ventricular ejection fraction is 35-45% by visual estimate.  There is mild to moderate left ventricular systolic dysfunction, with severe basal hypokinesis. Unchanged from prior cath.  LV end diastolic pressure is normal.  There is no aortic valve stenosis.   Nonobstructive CAD.  Continue aggressive secondary prevention and management of atrial fibrillation.    Study Conclusions - Left ventricle: The cavity size was mildly dilated. There was  mild concentric hypertrophy. Systolic function was normal. The  estimated ejection fraction was in the range of 55% to 60%. Wall motion was normal; there were no regional wall motion abnormalities.   Neuro/Psych PSYCHIATRIC DISORDERS Anxiety Depression negative neurological ROS     GI/Hepatic Neg liver ROS, GERD  Medicated and Controlled,  Endo/Other  negative endocrine ROS  Renal/GU negative Renal ROS     Musculoskeletal   Abdominal   Peds  Hematology negative hematology ROS (+) anemia ,   Anesthesia Other Findings   Reproductive/Obstetrics                             Anesthesia Physical  Anesthesia Plan  ASA: III  Anesthesia Plan: General   Post-op Pain Management:    Induction: Intravenous  PONV Risk Score and Plan: 3 and Ondansetron and Dexamethasone  Airway Management Planned: LMA  Additional Equipment:   Intra-op Plan:   Post-operative Plan: Extubation in OR  Informed Consent: I have reviewed the patients History and Physical, chart, labs and discussed the procedure including the risks, benefits and alternatives for the proposed anesthesia with the patient or authorized representative who has indicated his/her understanding and acceptance.   Dental advisory given  Plan Discussed with: CRNA  Anesthesia Plan Comments:        Anesthesia Quick Evaluation

## 2017-10-30 NOTE — Anesthesia Procedure Notes (Signed)
Procedure Name: LMA Insertion Date/Time: 10/30/2017 8:29 AM Performed by: Yolonda Kida, CRNA Pre-anesthesia Checklist: Patient identified, Emergency Drugs available, Suction available and Patient being monitored Patient Re-evaluated:Patient Re-evaluated prior to induction Oxygen Delivery Method: Circle system utilized Preoxygenation: Pre-oxygenation with 100% oxygen Induction Type: IV induction Ventilation: Mask ventilation without difficulty LMA: LMA inserted LMA Size: 4.0 Number of attempts: 1 Placement Confirmation: positive ETCO2,  CO2 detector and breath sounds checked- equal and bilateral Tube secured with: Tape Dental Injury: Teeth and Oropharynx as per pre-operative assessment

## 2017-10-30 NOTE — Discharge Instructions (Signed)
°Post Anesthesia Home Care Instructions ° °Activity: °Get plenty of rest for the remainder of the day. A responsible individual must stay with you for 24 hours following the procedure.  °For the next 24 hours, DO NOT: °-Drive a car °-Operate machinery °-Drink alcoholic beverages °-Take any medication unless instructed by your physician °-Make any legal decisions or sign important papers. ° °Meals: °Start with liquid foods such as gelatin or soup. Progress to regular foods as tolerated. Avoid greasy, spicy, heavy foods. If nausea and/or vomiting occur, drink only clear liquids until the nausea and/or vomiting subsides. Call your physician if vomiting continues. ° °Special Instructions/Symptoms: °Your throat may feel dry or sore from the anesthesia or the breathing tube placed in your throat during surgery. If this causes discomfort, gargle with warm salt water. The discomfort should disappear within 24 hours. ° °If you had a scopolamine patch placed behind your ear for the management of post- operative nausea and/or vomiting: ° °1. The medication in the patch is effective for 72 hours, after which it should be removed.  Wrap patch in a tissue and discard in the trash. Wash hands thoroughly with soap and water. °2. You may remove the patch earlier than 72 hours if you experience unpleasant side effects which may include dry mouth, dizziness or visual disturbances. °3. Avoid touching the patch. Wash your hands with soap and water after contact with the patch. °  ° °Indwelling Urinary Catheter Care, Adult °Take good care of your catheter to keep it working and to prevent problems. °How to wear your catheter °Attach your catheter to your leg with tape (adhesive tape) or a leg strap. Make sure it is not too tight. If you use tape, remove any bits of tape that are already on the catheter. °How to wear a drainage bag °You should have: °· A large overnight bag. °· A small leg bag. ° °Overnight Bag °You may wear the  overnight bag at any time. Always keep the bag below the level of your bladder but off the floor. When you sleep, put a clean plastic bag in a wastebasket. Then hang the bag inside the wastebasket. °Leg Bag °Never wear the leg bag at night. Always wear the leg bag below your knee. Keep the leg bag secure with a leg strap or tape. °How to care for your skin °· Clean the skin around the catheter at least once every day. °· Shower every day. Do not take baths. °· Put creams, lotions, or ointments on your genital area only as told by your doctor. °· Do not use powders, sprays, or lotions on your genital area. °How to clean your catheter and your skin °1. Wash your hands with soap and water. °2. Wet a washcloth in warm water and gentle (mild) soap. °3. Use the washcloth to clean the skin where the catheter enters your body. Clean downward and wipe away from the catheter in small circles. Do not wipe toward the catheter. °4. Pat the area dry with a clean towel. Make sure to clean off all soap. °How to care for your drainage bags °Empty your drainage bag when it is ?-½ full or at least 2-3 times a day. Replace your drainage bag once a month or sooner if it starts to smell bad or look dirty. Do not clean your drainage bag unless told by your doctor. °Emptying a drainage bag ° °Supplies Needed °· Rubbing alcohol. °· Gauze pad or cotton ball. °· Tape or a leg strap. ° °  Steps °1. Wash your hands with soap and water. °2. Separate (detach) the bag from your leg. °3. Hold the bag over the toilet or a clean container. Keep the bag below your hips and bladder. This stops pee (urine) from going back into the tube. °4. Open the pour spout at the bottom of the bag. °5. Empty the pee into the toilet or container. Do not let the pour spout touch any surface. °6. Put rubbing alcohol on a gauze pad or cotton ball. °7. Use the gauze pad or cotton ball to clean the pour spout. °8. Close the pour spout. °9. Attach the bag to your leg with  tape or a leg strap. °10. Wash your hands. ° °Changing a drainage bag °Supplies Needed °· Alcohol wipes. °· A clean drainage bag. °· Adhesive tape or a leg strap. ° °Steps °1. Wash your hands with soap and water. °2. Separate the dirty bag from your leg. °3. Pinch the rubber catheter with your fingers so that pee does not spill out. °4. Separate the catheter tube from the drainage tube where these tubes connect (at the connection valve). Do not let the tubes touch any surface. °5. Clean the end of the catheter tube with an alcohol wipe. Use a different alcohol wipe to clean the end of the drainage tube. °6. Connect the catheter tube to the drainage tube of the clean bag. °7. Attach the new bag to the leg with adhesive tape or a leg strap. °8. Wash your hands. ° °How to prevent infection and other problems °· Never pull on your catheter or try to remove it. Pulling can damage tissue in your body. °· Always wash your hands before and after touching your catheter. °· If a leg strap gets wet, replace it with a dry one. °· Drink enough fluids to keep your pee clear or pale yellow, or as told by your doctor. °· Do not let the drainage bag or tubing touch the floor. °· Wear cotton underwear. °· If you are male, wipe from front to back after you poop (have a bowel movement). °· Check on the catheter often to make sure it works and the tubing is not twisted. °Get help if: °· Your pee is cloudy. °· Your pee smells unusually bad. °· Your pee is not draining into the bag. °· Your tube gets clogged. °· Your catheter starts to leak. °· Your bladder feels full. °Get help right away if: °· You have redness, swelling, or pain where the catheter enters your body. °· You have fluid, pus, or a bad smell coming from the area where the catheter enters your body. °· The area where the catheter enters your body feels warm. °· You have a fever. °· You have pain in your: °? Stomach (abdomen). °? Legs. °? Lower back. °? Bladder. °· You see  blood fill the catheter. °· Your pee is pink or red. °· You feel sick to your stomach (nauseous). °· You throw up (vomit). °· You have chills. °· Your catheter gets pulled out. °This information is not intended to replace advice given to you by your health care provider. Make sure you discuss any questions you have with your health care provider. °Document Released: 08/30/2012 Document Revised: 04/02/2016 Document Reviewed: 10/18/2013 °[employer] Interactive Patient Education © 2018 [employer] ° ° °Prostate Laser Surgery, Care After °This sheet gives you information about how to care for yourself after your procedure. Your health care provider may also give you more specific   instructions. If you have problems or questions, contact your health care provider. °What can I expect after the procedure? °For the first few weeks after the procedure: °· You will feel a need to urinate often. °· You may have blood in your urine. °· You may feel a sudden need to urinate. ° °Once your urinary catheter is removed, you may have a burning feeling when you urinate, especially at the end of urination. This feeling usually passes within 3-5 days. °Follow these instructions at home: °Activity °· Return to your normal activities as told by your health care provider. Ask your health care provider what activities are safe for you. °· Do not do vigorous exercise for 1 week or as told by your health care provider. °· Do not lift anything that is heavier than 10 lb (4.5 kg) until your health care provider say it is safe. °· Avoid sexual activity for 4-6 weeks or as told by your health care provider. °· Do not ride in a car for extended periods of time for 1 month or as told by your health care provider. °· Do not drive for 24 hours if you were given a medicine to help you relax (sedative). °Diet °· Eat foods that are high in fiber, such as fresh fruits and vegetables, whole grains, and beans. °· Drink enough fluid to keep your urine clear  or pale yellow. °Medicines °· Take over-the-counter and prescription medicines, including stool softeners, only as told by your health care provider. °· If you were prescribed an antibiotic medicine, take it as told by your health care provider. Do not stop taking the antibiotic even if you start to feel better. °General instructions °· If you were given elastic support stockings, wear them as told by your health care provider. °· Do not strain to have a bowel movement. Straining may lead to bleeding from the prostate and cause clots to form and cause trouble urinating. °· Keep all follow-up visits as told by your health care provider. This is important. °Contact a health care provider if: °· You have a fever or chills. °· You have spasms or pain with the urinary catheter still in place. °· Once the catheter has been removed, you experience difficulty starting your stream when attempting to urinate. °Get help right away if: °· There is a blockage in your catheter. °· Your catheter has been removed and you are suddenly unable to urinate. °· Your urine smells unusually bad. °· You start to have blood clots in your urine. °· The blood in your urine becomes persistent or gets thick. °· You develop chest pains. °· You develop shortness of breath. °· You develop swelling or pain in your leg. °Summary °· You may notice urinary symptoms for a few weeks after your procedure. °· Follow instructions from your health care provider regarding activity restrictions such as lifting, exercise, and sexual activity. °· Contact your health care provider if you have any unusual symptoms during your recovery. °This information is not intended to replace advice given to you by your health care provider. Make sure you discuss any questions you have with your health care provider. °Document Released: 05/05/2005 Document Revised: 12/21/2015 Document Reviewed: 12/21/2015 °Elsevier Interactive Patient Education © 2018 [employer] ° °

## 2017-11-01 NOTE — Anesthesia Postprocedure Evaluation (Signed)
Anesthesia Post Note  Patient: Cody Brewer  Procedure(s) Performed: CYSTOSCOPY WITH LITHOLAPAXY (N/A ) THULIUM LASER TURP (TRANSURETHRAL RESECTION OF PROSTATE) (N/A )     Patient location during evaluation: PACU Anesthesia Type: General Level of consciousness: sedated and patient cooperative Pain management: pain level controlled Vital Signs Assessment: post-procedure vital signs reviewed and stable Respiratory status: spontaneous breathing Cardiovascular status: stable Anesthetic complications: no    Last Vitals:  Vitals:   10/30/17 1355 10/30/17 1400  BP: (!) 162/102   Pulse:    Resp:    Temp:  36.5 C  SpO2:      Last Pain:  Vitals:   10/30/17 1435  TempSrc:   PainSc: 4                  [name]

## 2017-11-02 ENCOUNTER — Encounter (HOSPITAL_BASED_OUTPATIENT_CLINIC_OR_DEPARTMENT_OTHER): Payer: Self-pay | Admitting: Urology

## 2017-11-04 ENCOUNTER — Other Ambulatory Visit: Payer: Self-pay | Admitting: Internal Medicine

## 2017-12-07 ENCOUNTER — Other Ambulatory Visit: Payer: Self-pay | Admitting: Internal Medicine

## 2017-12-07 NOTE — Telephone Encounter (Signed)
Pt is a 67 yr old male who last saw Dr Ladona Ridgel on 01/23/17, weight at that visit 84Kg. Last noted SCr on 10/30/17 was 0.90. Will refill Eliquis 5mg  BID.

## 2018-02-12 ENCOUNTER — Telehealth: Payer: Self-pay | Admitting: Internal Medicine

## 2018-02-12 NOTE — Telephone Encounter (Signed)
Original d/c received at [address] office. Gave to Layla Maw has agreed to sign.

## 2018-02-16 DEATH — deceased

## 2018-02-17 ENCOUNTER — Telehealth: Payer: Self-pay | Admitting: Internal Medicine

## 2018-02-17 NOTE — Telephone Encounter (Signed)
Called spoke with [facility] they are aware d/c signed and ready for pick up.

## 2018-03-06 ENCOUNTER — Other Ambulatory Visit: Payer: Self-pay | Admitting: Internal Medicine

## 2018-03-26 ENCOUNTER — Ambulatory Visit: Payer: Medicare Other | Admitting: Internal Medicine
